# Patient Record
Sex: Female | Born: 1935 | Race: White | Hispanic: No | Smoking: Never smoker
Health system: Southern US, Community
[De-identification: ages and names within clinical notes are randomized; demographics above are authoritative.]

## PROBLEM LIST (undated history)

## (undated) DIAGNOSIS — I5042 Chronic combined systolic (congestive) and diastolic (congestive) heart failure: Secondary | ICD-10-CM

## (undated) DIAGNOSIS — I480 Paroxysmal atrial fibrillation: Secondary | ICD-10-CM

## (undated) DIAGNOSIS — I421 Obstructive hypertrophic cardiomyopathy: Secondary | ICD-10-CM

## (undated) DIAGNOSIS — I82409 Acute embolism and thrombosis of unspecified deep veins of unspecified lower extremity: Secondary | ICD-10-CM

## (undated) DIAGNOSIS — I340 Nonrheumatic mitral (valve) insufficiency: Secondary | ICD-10-CM

## (undated) DIAGNOSIS — J449 Chronic obstructive pulmonary disease, unspecified: Secondary | ICD-10-CM

## (undated) DIAGNOSIS — I10 Essential (primary) hypertension: Secondary | ICD-10-CM

## (undated) DIAGNOSIS — J690 Pneumonitis due to inhalation of food and vomit: Secondary | ICD-10-CM

## (undated) DIAGNOSIS — J189 Pneumonia, unspecified organism: Secondary | ICD-10-CM

## (undated) DIAGNOSIS — I447 Left bundle-branch block, unspecified: Secondary | ICD-10-CM

## (undated) DIAGNOSIS — C44622 Squamous cell carcinoma of skin of right upper limb, including shoulder: Secondary | ICD-10-CM

## (undated) DIAGNOSIS — R011 Cardiac murmur, unspecified: Secondary | ICD-10-CM

## (undated) DIAGNOSIS — R0602 Shortness of breath: Secondary | ICD-10-CM

## (undated) DIAGNOSIS — Z950 Presence of cardiac pacemaker: Secondary | ICD-10-CM

## (undated) DIAGNOSIS — I639 Cerebral infarction, unspecified: Secondary | ICD-10-CM

## (undated) DIAGNOSIS — I214 Non-ST elevation (NSTEMI) myocardial infarction: Secondary | ICD-10-CM

## (undated) DIAGNOSIS — I071 Rheumatic tricuspid insufficiency: Secondary | ICD-10-CM

## (undated) DIAGNOSIS — E871 Hypo-osmolality and hyponatremia: Secondary | ICD-10-CM

## (undated) DIAGNOSIS — Z9981 Dependence on supplemental oxygen: Secondary | ICD-10-CM

## (undated) DIAGNOSIS — F419 Anxiety disorder, unspecified: Secondary | ICD-10-CM

## (undated) DIAGNOSIS — R001 Bradycardia, unspecified: Secondary | ICD-10-CM

## (undated) DIAGNOSIS — R1310 Dysphagia, unspecified: Secondary | ICD-10-CM

## (undated) DIAGNOSIS — R7303 Prediabetes: Secondary | ICD-10-CM

## (undated) HISTORY — PX: CHOLECYSTECTOMY: SHX55

---

## 1998-11-04 ENCOUNTER — Ambulatory Visit (HOSPITAL_COMMUNITY): Admission: RE | Admit: 1998-11-04 | Discharge: 1998-11-04 | Payer: Self-pay | Admitting: Neurology

## 2005-02-22 ENCOUNTER — Ambulatory Visit: Payer: Self-pay | Admitting: Emergency Medicine

## 2005-02-22 ENCOUNTER — Observation Stay (HOSPITAL_COMMUNITY): Admission: EM | Admit: 2005-02-22 | Discharge: 2005-02-22 | Payer: Self-pay | Admitting: Emergency Medicine

## 2007-10-26 ENCOUNTER — Encounter (INDEPENDENT_AMBULATORY_CARE_PROVIDER_SITE_OTHER): Payer: Self-pay | Admitting: Neurology

## 2007-10-26 ENCOUNTER — Inpatient Hospital Stay (HOSPITAL_COMMUNITY): Admission: EM | Admit: 2007-10-26 | Discharge: 2007-11-03 | Payer: Self-pay | Admitting: Emergency Medicine

## 2007-10-26 ENCOUNTER — Ambulatory Visit: Payer: Self-pay | Admitting: Cardiology

## 2007-10-26 ENCOUNTER — Ambulatory Visit: Payer: Self-pay | Admitting: Critical Care Medicine

## 2007-10-27 ENCOUNTER — Encounter (INDEPENDENT_AMBULATORY_CARE_PROVIDER_SITE_OTHER): Payer: Self-pay | Admitting: Neurology

## 2007-10-29 ENCOUNTER — Ambulatory Visit: Payer: Self-pay | Admitting: Physical Medicine & Rehabilitation

## 2007-11-03 ENCOUNTER — Ambulatory Visit: Payer: Self-pay | Admitting: Cardiology

## 2007-11-03 ENCOUNTER — Inpatient Hospital Stay (HOSPITAL_COMMUNITY)
Admission: RE | Admit: 2007-11-03 | Discharge: 2007-11-10 | Payer: Self-pay | Admitting: Physical Medicine & Rehabilitation

## 2007-11-05 ENCOUNTER — Other Ambulatory Visit: Payer: Self-pay | Admitting: Neurology

## 2007-11-18 ENCOUNTER — Ambulatory Visit: Payer: Self-pay | Admitting: Cardiology

## 2007-11-18 ENCOUNTER — Inpatient Hospital Stay (HOSPITAL_COMMUNITY): Admission: AD | Admit: 2007-11-18 | Discharge: 2007-11-21 | Payer: Self-pay | Admitting: Cardiology

## 2007-11-19 HISTORY — PX: INSERT / REPLACE / REMOVE PACEMAKER: SUR710

## 2007-11-26 ENCOUNTER — Ambulatory Visit: Payer: Self-pay | Admitting: Cardiology

## 2007-12-11 ENCOUNTER — Ambulatory Visit: Payer: Self-pay

## 2007-12-25 ENCOUNTER — Ambulatory Visit: Payer: Self-pay | Admitting: Cardiovascular Disease

## 2007-12-29 ENCOUNTER — Encounter
Admission: RE | Admit: 2007-12-29 | Discharge: 2007-12-29 | Payer: Self-pay | Admitting: Physical Medicine & Rehabilitation

## 2008-02-27 ENCOUNTER — Ambulatory Visit (HOSPITAL_COMMUNITY): Admission: RE | Admit: 2008-02-27 | Discharge: 2008-02-27 | Payer: Self-pay | Admitting: Neurology

## 2008-03-01 ENCOUNTER — Ambulatory Visit: Payer: Self-pay | Admitting: Cardiology

## 2008-03-05 ENCOUNTER — Ambulatory Visit: Payer: Self-pay | Admitting: Cardiology

## 2008-04-07 ENCOUNTER — Encounter: Payer: Self-pay | Admitting: Internal Medicine

## 2008-04-23 ENCOUNTER — Ambulatory Visit: Payer: Self-pay | Admitting: Internal Medicine

## 2008-07-30 ENCOUNTER — Encounter: Admission: RE | Admit: 2008-07-30 | Discharge: 2008-07-30 | Payer: Self-pay | Admitting: Orthopedic Surgery

## 2008-11-10 ENCOUNTER — Encounter (INDEPENDENT_AMBULATORY_CARE_PROVIDER_SITE_OTHER): Payer: Self-pay | Admitting: *Deleted

## 2008-11-12 ENCOUNTER — Encounter: Payer: Self-pay | Admitting: Internal Medicine

## 2008-11-23 ENCOUNTER — Ambulatory Visit: Payer: Self-pay | Admitting: Internal Medicine

## 2008-11-23 DIAGNOSIS — I4891 Unspecified atrial fibrillation: Secondary | ICD-10-CM | POA: Insufficient documentation

## 2008-11-24 LAB — CONVERTED CEMR LAB
AST: 33 units/L (ref 0–37)
Alkaline Phosphatase: 92 units/L (ref 39–117)
TSH: 2.85 microintl units/mL (ref 0.35–5.50)
Total Bilirubin: 0.9 mg/dL (ref 0.3–1.2)

## 2009-06-28 ENCOUNTER — Ambulatory Visit: Payer: Self-pay | Admitting: Internal Medicine

## 2009-07-07 LAB — CONVERTED CEMR LAB
ALT: 36 units/L — ABNORMAL HIGH (ref 0–35)
Alkaline Phosphatase: 96 units/L (ref 39–117)
Bilirubin, Direct: 0.1 mg/dL (ref 0.0–0.3)
Total Bilirubin: 0.4 mg/dL (ref 0.3–1.2)
Total Protein: 7.7 g/dL (ref 6.0–8.3)

## 2009-08-08 ENCOUNTER — Telehealth: Payer: Self-pay | Admitting: Internal Medicine

## 2009-09-20 ENCOUNTER — Encounter: Payer: Self-pay | Admitting: Internal Medicine

## 2009-12-08 ENCOUNTER — Telehealth: Payer: Self-pay | Admitting: Internal Medicine

## 2010-02-09 ENCOUNTER — Ambulatory Visit: Payer: Self-pay | Admitting: Internal Medicine

## 2010-02-09 ENCOUNTER — Encounter: Payer: Self-pay | Admitting: Internal Medicine

## 2010-02-09 DIAGNOSIS — R740 Nonspecific elevation of levels of transaminase and lactic acid dehydrogenase [LDH]: Secondary | ICD-10-CM

## 2010-03-02 ENCOUNTER — Encounter: Payer: Self-pay | Admitting: Internal Medicine

## 2010-03-02 LAB — CONVERTED CEMR LAB
ALT: 22 units/L (ref 0–35)
Albumin: 3.3 g/dL — ABNORMAL LOW (ref 3.5–5.2)
Alkaline Phosphatase: 109 units/L (ref 39–117)
BUN: 20 mg/dL (ref 6–23)
Calcium: 9 mg/dL (ref 8.4–10.5)
GFR calc non Af Amer: 53.55 mL/min — ABNORMAL LOW (ref >60.00)
Glucose, Bld: 102 mg/dL — ABNORMAL HIGH (ref 70–99)
Potassium: 5.5 meq/L — ABNORMAL HIGH (ref 3.5–5.1)
Sodium: 139 meq/L (ref 135–145)
Total Protein: 7.4 g/dL (ref 6.0–8.3)

## 2010-03-20 ENCOUNTER — Encounter: Payer: Self-pay | Admitting: Physical Medicine & Rehabilitation

## 2010-03-28 ENCOUNTER — Telehealth (INDEPENDENT_AMBULATORY_CARE_PROVIDER_SITE_OTHER): Payer: Self-pay | Admitting: *Deleted

## 2010-03-28 ENCOUNTER — Encounter: Payer: Self-pay | Admitting: Internal Medicine

## 2010-03-28 NOTE — Progress Notes (Signed)
Summary: WHEN TO COME BACK   Phone Note Call from Patient Call back at Home Phone [phone]   Caller: Daughter Lavonia Dana [phone] Reason for Call: Talk to Nurse Summary of Call: PT CAME IN EARLY FOR HER 6-20 APPT WHEN SHOULD SHE COME BACK? Initial call taken by: Glynda Jaeger,  August 08, 2009 4:23 PM  Follow-up for Phone Call        6 months with device clinic. Gypsy Balsam RN BSN  August 12, 2009 3:44 PM    Additional Follow-up for Phone Call Additional follow up Details #1::        put in reminder for 6 months   (done wk of 6-17) Additional Follow-up by: Glynda Jaeger,  September 01, 2009 2:10 PM

## 2010-03-28 NOTE — Miscellaneous (Signed)
Summary: pt instructions  Clinical Lists Changes  Orders: Added new Test order of TLB-T4 (Thyrox), Free [phone]) - Signed Added new Test order of TLB-TSH (Thyroid Stimulating Hormone) (84443-TSH) - Signed Added new Test order of TLB-Hepatic/Liver Function Pnl (80076-HEPATIC) - Signed Observations: Added new observation of PI CARDIO: Your physician recommends that you schedule a follow-up appointment in: 6 months with Dr Graciela Husbands Your physician recommends that you return for lab work today (06/28/2009 14:10)      Patient Instructions: 1)  Your physician recommends that you schedule a follow-up appointment in: 6 months with Dr Graciela Husbands 2)  Your physician recommends that you return for lab work today

## 2010-03-28 NOTE — Progress Notes (Signed)
Summary: pt has xray today has pacemaker not feeling well   Phone Note Call from Patient   Caller: Daughter Dawn Barrett [phone] or [phone] Reason for Call: Talk to Nurse Summary of Call: pt had dental visit, had xray , forgot about pacemaker, now not feeling well, pt's dtr's receptionist is calling so doesn't know anymore info-pls call dtr Dawn Barrett Initial call taken by: Glynda Jaeger,  December 08, 2009 12:15 PM  Additional Follow-up for Phone Call Additional follow up Details #1::        spoke w/pt's daughter to let her know that dental xr is fine w/pacer. daughter to let pt know. Vella Kohler  December 08, 2009 12:56 PM

## 2010-03-28 NOTE — Assessment & Plan Note (Signed)
Summary: appt @ 12:45/ rov/jml      Allergies Added:   Primary Provider:  Dr.Love  CC:  device check.  History of Present Illness: [name] is seen in foloowup of tachy-brady syndrome with PAF and bradycardia with previously implanted pacemaker. She denies problems with chest pain or shortness of breath. She notes that there is no significant edema  Current Medications (verified): 1)  Amiodarone Hcl 200 Mg Tabs (Amiodarone Hcl) .... Take One Half Tablet Once Daily 2)  Diovan 40 Mg Tabs (Valsartan) .... Take 3 Tablets Am, 2 Tablets Pm 3)  Coumadin 2 Mg Tabs (Warfarin Sodium) .... 2 Mgs On Mon., Wed.,  and Fri.  1 Mg On Others Days  Allergies (verified): 1)  ! Sulfa 2)  ! Codeine  Past History:  Past Medical History: Last updated: 11/23/2008 Symptomatic bradycardia with junctional rhythm History of paroxysmal atrial fibrillation treated with amiodarone  History of recent left middle cerebral artery stroke treated with intravenous tPA with significant recovery.  Hypertension Status post pacemaker for sinus node dysfunction-St Jude Zephyr 5020  Past Surgical History: Last updated: 11/23/2008 Implantation of pacemaker-St. Jude Zephyr 5020  Social History: Last updated: 11/23/2008 Tobacco Use - No.  Alcohol Use - no  Vital Signs:  Patient profile:   77 year old female Height:      60 inches Weight:      134 pounds BMI:     26.26 Pulse rate:   64 / minute Pulse rhythm:   regular BP sitting:   146 / 69  (left arm) Cuff size:   regular  Vitals Entered By: Judithe Modest CMA (Jun 28, 2009 1:01 PM)  Physical Exam  General:  The patient was alert and oriented in no acute distress. HEENT Normal.  Neck veins were flat, carotids were brisk.  Lungs were clear.  Heart sounds were regular without murmurs or gallops.  Abdomen was soft with active bowel sounds. There is no clubbing cyanosis or edema. Skin Warm and dry \\par   PPM Specifications Following MD:  Sherryl Manges, MD     PPM Vendor:  St Jude     PPM Model Number:  [id number]     PPM Serial Number:  [id number] DOI:  11/19/2006     PPM Implanting MD:  Sherryl Manges, MD  Lead 1    Location: RA     DOI: 11/19/2006     Model #: [id number]     Serial #: [id number]     Status: active Lead 2    Location: RV     DOI: 11/19/2006     Model #: [id number]     Serial #: [id number]     Status: active  Magnet Response Rate:  BOL 98.6 ERI  86.3  Indications:  Sinus node dysfunction   PPM Follow Up Remote Check?  No Battery Voltage:  2.80 V     Battery Est. Longevity:  4.50-6.69YRS     Pacer Dependent:  No       PPM Device Measurements Atrium  Amplitude: 2.2 mV, Impedance: 495 ohms, Threshold: 0.75 V at 0.5 msec Right Ventricle  Amplitude: >12 mV, Impedance: 1004 ohms, Threshold: 0.75 V at 0.5 msec  Episodes MS Episodes:  1     Percent Mode Switch:  <1%     Ventricular High Rate:  0     Atrial Pacing:  88%     Ventricular Pacing:  <1%  Parameters Mode:  DDDR     Lower Rate Limit:  60     Upper Rate Limit:  120 Paced AV Delay:  225     Sensed AV Delay:  200 Next Cardiology Appt Due:  12/27/2009 Tech Comments:  AMS LASTING 6 SECONDS.  NORMAL DEVICE FUNCTION.  NO CHANGES MADE.  ROV IN 6 MTHS. Vella Kohler  Impression & Recommendations:  Problem # 1:  ATRIAL FIBRILLATION (ICD-427.31) holding sinus rhythm on amiodarone. We will check her TSH and LFTs today Her updated medication list for this problem includes:    Amiodarone Hcl 200 Mg Tabs (Amiodarone hcl) .Marland Kitchen... Take one half tablet once daily    Coumadin 2 Mg Tabs (Warfarin sodium) .Marland Kitchen... 2 mgs on mon., wed.,  and fri.  1 mg on others days  Problem # 2:  BRADYCARDIA (ICD-427.81) he is pacing 90% of the time Her updated medication list for this problem includes:    Amiodarone Hcl 200 Mg Tabs (Amiodarone hcl) .Marland Kitchen... Take one half tablet once daily    Coumadin 2 Mg Tabs (Warfarin sodium) .Marland Kitchen... 2 mgs on mon., wed.,  and fri.  1 mg on others days  Problem # 3:   PACEMAKER ST JUDES DDD (ICD-V45.01) Device parameters and data were reviewed and no changes were made

## 2010-03-28 NOTE — Cardiovascular Report (Signed)
Summary: Office Visit   Office Visit   Imported By: Roderic Ovens 07/01/2009 14:15:42  _____________________________________________________________________  External Attachment:    Type:   Image     Comment:   External Document

## 2010-03-30 NOTE — Assessment & Plan Note (Signed)
Summary: 6 month follow up/mt      Allergies Added:   Visit Type:  6 month follow up Primary Provider:  Orma Render  CC:  no complaints.  History of Present Illness: Dawn Barrett is seen in foloowup of tachy-brady syndrome with PAF and bradycardia with previously implanted pacemaker.  She takes amiodarone. The last July surveillance laboratories demonstrated a mild elevation in her transaminases. These were rechecked subsequently and they had normalized. She denies problems with chest pain or shortness of breath. She notes that there is no significant edema  Problems Prior to Update: 1)  Transaminases, Serum, Elevated  (ICD-790.4) 2)  Encounter For Long-term Use of Other Medications  (ICD-V58.69) 3)  Encounter For Long-term Use of Amiodarone  (ICD-V58.69) 4)  Pacemaker St Judes Ddd  (ICD-V45.01) 5)  Bradycardia  (ICD-427.81) 6)  Atrial Fibrillation  (ICD-427.31)  Current Medications (verified): 1)  Amiodarone Hcl 200 Mg Tabs (Amiodarone Hcl) .... Take One Half Tablet Once Daily 2)  Diovan 40 Mg Tabs (Valsartan) .... Take 3 Tablets Am, 2 Tablets Pm 3)  Coumadin 2 Mg Tabs (Warfarin Sodium) .... 2 Mgs On Mon., Wed.,  and Fri.  1 Mg On Others Days  Allergies (verified): 1)  ! Sulfa 2)  ! Codeine  Past History:  Past Medical History: Last updated: 11/23/2008 Symptomatic bradycardia with junctional rhythm History of paroxysmal atrial fibrillation treated with amiodarone  History of recent left middle cerebral artery stroke treated with intravenous tPA with significant recovery.  Hypertension Status post pacemaker for sinus node dysfunction-St Jude Zephyr 5020  Past Surgical History: Last updated: 11/23/2008 Implantation of pacemaker-St. Jude Zephyr 5020  Social History: Last updated: 11/23/2008 Tobacco Use - No.  Alcohol Use - no  Risk Factors: Smoking Status: never (11/23/2008)  Vital Signs:  Patient profile:   77 year old female Height:      60 inches Weight:       130 pounds BMI:     25.48 Pulse rate:   77 / minute BP sitting:   150 / 72  (left arm) Cuff size:   regular  Vitals Entered By: Caralee Ates CMA (February 09, 2010 10:53 AM)  Physical Exam  General:  The patient was alert and oriented in no acute distress. HEENT Normal.  Neck veins were flat, carotids were brisk.  Lungs were clear. she is markedly kyphotic Heart sounds were regular without murmurs or gallops.  Abdomen was soft with active bowel sounds. There is no clubbing cyanosis or edema. Skin Warm and dry    EKG  Procedure date:  02/09/2010  Findings:      A pacing with LBBB, LAD  PPM Specifications Following MD:  Sherryl Manges, MD     PPM Vendor:  St Jude     PPM Model Number:  [id number]     PPM Serial Number:  [id number] PPM DOI:  11/19/2006     PPM Implanting MD:  Sherryl Manges, MD  Lead 1    Location: RA     DOI: 11/19/2006     Model #: [id number]     Serial #: [id number]     Status: active Lead 2    Location: RV     DOI: 11/19/2006     Model #: [id number]     Serial #: [id number]     Status: active  Magnet Response Rate:  BOL 98.6 ERI  86.3  Indications:  Sinus node dysfunction   PPM Follow Up Battery Voltage:  2.79 V  Battery Est. Longevity:  4-5.25 yrs     Pacer Dependent:  No       PPM Device Measurements Atrium  Amplitude: 2.5 mV, Impedance: 434 ohms, Threshold: 0.75 V at 0.5 msec Right Ventricle  Amplitude: 12.0 mV, Impedance: 1162 ohms, Threshold: 0.75 V at 0.5 msec  Episodes MS Episodes:  0     Ventricular High Rate:  0     Atrial Pacing:  88%     Ventricular Pacing:  <1%  Parameters Mode:  DDDR     Lower Rate Limit:  60     Upper Rate Limit:  120 Paced AV Delay:  225     Sensed AV Delay:  200 Next Cardiology Appt Due:  07/28/2010 Tech Comments:  NORMAL DEVICE FUNCTION.  NO EPISODES OR MODE SWITCHES RECORDED.  NO CHANGES MADE. ROV IN 6 MTHS W/DEVICE CLINIC. Vella Kohler  February 09, 2010 11:11 AM  Impression & Recommendations:  Problem # 1:  PACEMAKER ST  JUDES DDD (ICD-V45.01) Device parameters and data were reviewed and no changes were made  Problem # 2:  ENCOUNTER FOR LONG-TERM USE OF OTHER MEDICATIONS (ICD-V58.69)  needs repeat labs for Amio survillance  Orders: TLB-Hepatic/Liver Function Pnl (80076-HEPATIC) TLB-TSH (Thyroid Stimulating Hormone) (84443-TSH) TLB-BMP (Basic Metabolic Panel-BMET) (80048-METABOL)  Problem # 3:  ATRIAL FIBRILLATION (ICD-427.31)  no intercurrent af  is taking amio Her updated medication list for this problem includes:    Amiodarone Hcl 200 Mg Tabs (Amiodarone hcl) .Dawn Barrett... Take one half tablet once daily    Coumadin 2 Mg Tabs (Warfarin sodium) .Dawn Barrett... 2 mgs on mon., wed.,  and fri.  1 mg on others days  Orders: TLB-Hepatic/Liver Function Pnl (80076-HEPATIC) TLB-TSH (Thyroid Stimulating Hormone) (84443-TSH) TLB-BMP (Basic Metabolic Panel-BMET) (80048-METABOL)  Problem # 4:  BRADYCARDIA (ICD-427.81) 90 % paced  Her updated medication list for this problem includes:    Amiodarone Hcl 200 Mg Tabs (Amiodarone hcl) .Dawn Barrett... Take one half tablet once daily    Coumadin 2 Mg Tabs (Warfarin sodium) .Dawn Barrett... 2 mgs on mon., wed.,  and fri.  1 mg on others days  Problem # 5:  TRANSAMINASES, SERUM, ELEVATED (ICD-790.4) will recheck today  Patient Instructions: 1)  Your physician recommends that you have lab work today. 2)  Your physician recommends that you continue on your current medications as directed. Please refer to the Current Medication list given to you today. 3)  Your physician wants you to follow-up in:6 months.    You will receive a reminder letter in the mail two months in advance. If you don't receive a letter, please call our office to schedule the follow-up appointment.

## 2010-03-30 NOTE — Cardiovascular Report (Signed)
Summary: Office Visit   Office Visit   Imported By: Roderic Ovens 02/13/2010 16:20:10  _____________________________________________________________________  External Attachment:    Type:   Image     Comment:   External Document

## 2010-03-30 NOTE — Letter (Signed)
Summary: Recall, Labs Needed  [facility], Main Office  [address]   [place], [place] [address]   Phone: [phone]  Fax: [phone]     March 02, 2010 MRN: [id number]   [facility] [address] [place], [place]  [address]   Dear Ms. Gouge,  Our records indicate it is time to repeat your blood work.  Please call our office to schedule your appointment.    After your labs are drawn, our office will call you within a week to ten business days with the results.  If you do not hear from [place] in 10 business days, you should call the office.  If you have any questions regarding this, call the office at the number listed above, and ask for the nurse.  Labs are due: As soon as possible.                               Sincerely,   [facility]

## 2010-04-05 NOTE — Progress Notes (Signed)
   Phone Note Outgoing Call   Call placed by: Scherrie Bateman, LPN,  March 28, 2010 9:03 AM Call placed to: PT'S DAUGHTER ASSISTANT MONICA Summary of Call: DAUGHTER'S ASSISTANT AWARE PT NEEDS  REPEAT BMET ORDER FAXED  TO DAUGHTER'S OFFICE PT TO HAVE DONE IN [place]. Valinda Hoar NUMBER [phone] Initial call taken by: Scherrie Bateman, LPN,  March 28, 2010 9:04 AM

## 2010-04-19 ENCOUNTER — Telehealth: Payer: Self-pay | Admitting: Internal Medicine

## 2010-04-25 NOTE — Progress Notes (Signed)
Summary: QUESTION RE MEDS    Phone Note Call from Patient Call back at [facility] [phone]   Caller: Daughter/BONNIE [phone] Reason for Call: Talk to Nurse, Talk to Doctor Summary of Call: QUESTION RE MEDS FOR TEST.  Initial call taken by: Roe Coombs,  April 19, 2010 3:45 PM  Follow-up for Phone Call        Phone Call Completed PER DAUGHTER PT  HARD TO AROUSE  WENT TO BED LAST NIGHT AT 5:OO PM AND DIDN'T GET UP TIL THIS  AM  THYROID IS HYPOTHYROIDISM PER LAB   DAUGHTER STATES  S/S NOTED FOR 4-5 DAYS  WORSE OVER LAST COUPLE OF DAYS WILL DISCUSS WITH DR Graciela Husbands . Follow-up by: Scherrie Bateman, LPN,  April 19, 2010 5:38 PM  Additional Follow-up for Phone Call Additional follow up Details #1::        spooke w fmaily and reviewed tsh  i dont think that that is the issue Additional Follow-up by: Nathen May, MD, [facility],  April 19, 2010 6:15 PM

## 2010-05-16 NOTE — Medication Information (Signed)
Summary: Physician's Lab Orders   Physician's Lab Orders   Imported By: Roderic Ovens 05/04/2010 11:32:05  _____________________________________________________________________  External Attachment:    Type:   Image     Comment:   External Document

## 2010-06-12 LAB — CBC
HCT: 36.8 % (ref 36.0–46.0)
Platelets: 170 10*3/uL (ref 150–400)
RDW: 15 % (ref 11.5–15.5)
WBC: 5.4 10*3/uL (ref 4.0–10.5)

## 2010-06-12 LAB — DIFFERENTIAL
Basophils Absolute: 0 10*3/uL (ref 0.0–0.1)
Eosinophils Relative: 2 % (ref 0–5)
Lymphocytes Relative: 37 % (ref 12–46)
Lymphs Abs: 2 10*3/uL (ref 0.7–4.0)
Neutro Abs: 2.6 10*3/uL (ref 1.7–7.7)

## 2010-06-12 LAB — PROTIME-INR
INR: 1.5 (ref 0.00–1.49)
Prothrombin Time: 18.8 seconds — ABNORMAL HIGH (ref 11.6–15.2)

## 2010-07-11 NOTE — Discharge Summary (Signed)
Dawn Barrett, Dawn Barrett           ACCOUNT NO.:  [id number]   MEDICAL RECORD NO.:  [id number]          PATIENT TYPE:  INP   LOCATION:  2019                         FACILITY:  [facility]   PHYSICIAN:  Duke Salvia, MD, FACCDATE OF BIRTH:  17-Nov-1918   DATE OF ADMISSION:  11/18/2007  DATE OF DISCHARGE:  11/21/2007                               DISCHARGE SUMMARY   ADDENDUM   This is a revision of her medication list.  1. She is asked to keep her incision dry for the next 7 days .  Sponge      bathe until Wednesday, November 26, 2007, that she had a pacemaker      implanted on November 19, 2007.   MEDICATIONS:  1. Coumadin 1 mg daily, Home Health provides liaison to Dr. Juanda Chance.  2. Nexium 40 mg daily.  3. Multivitamin daily.  4. Lasix 20 mg daily, hold for now.  5. Amiodarone 100 mg daily, hold for now (Dr. Juanda Chance to decide to      reinstate at office visit).  6. Diovan, hold but reinstate IF blood pressure arises above 140 mmHg,      the systolic number.   She has [facility] [address]  1. Home Health manages Coumadin.  2. Dr. Juanda Chance, Thursday, December 11, 2007, at 8:45.  3. [facility], Thursday, December 11, 2007, at 9:40.  4. She will see Dr. Juanda Chance, January 2010 and his office will call with      an appointment.      Maple Mirza, [place]      Duke Salvia, MD, [facility]  Electronically Signed    GM/MEDQ  D:  11/21/2007  T:  11/21/2007  Job:  [id number]
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
## 2010-07-11 NOTE — Procedures (Signed)
This is an 77 year old female patient with EEG number [id number], currently  in room 3110 at [facility] admitted under the care of Dr. Avie Echevaria and Dr. Delia Heady.   Ordering physician for this EEG was Dr. Avie Echevaria.  Medical record  number [id number].   This is a portable EEG recording performed at the bedside in an ICU  setting for a lethargic right-handed patient who presented with sudden  onset of expressive aphagia and weakness. Atrial fibrillation is the  presumed cause for an ischaemic, embolic cerebral infarct in the left  ant. temporal lobe. The patient received TPA.   Current medications include Lasix, fentanyl, Versed, Protonix, Zosyn,  Normodyne, Cardene, Reglan, Tylenol, and Levophed.   DESCRIPTION:  This EEG is generalized slow.  A background rhythm cannot  be established.  The dominant amplitudes appear to arise from the right  frontal central region.  There is slowing and amplitude suppression  noticed over the frontal, central, and parietal region on the left.  Neither photic stimulation, no hyperventilation were performed.  The EKG  electrode channel shows a significant arrhythmia, highly irregular  shaped QRS complexes, and R to R intervals.  Throughout the recording,  there is a better formed and better organized EEG.  Background and rhythm seen throughout the right hemisphere in comparison  to the left, the patient did never reach alpha frequencies, but theta  and delta frequencies are seen.   CONCLUSION:  This is an abnormal EEG.  1. Left-sided amplitude suppression and slowing, significant      asymmetry.  2. Generalized slowing.  Question if due to medications.  It is not      noted on this sheet if the patient's sedation was stopped before      the EEG was written.  3. There is only over the left frontoparietal region some muscle      artifact noted raising the suspicion that the patient may have a      right-sided facial paralysis.  Photic  stimulation was performed at      the bedside towards the end of this recording and showed eye      twitching, more pronounced on the right than on the left; and      entrainment more pronounced on the right than on the left.  The      patient was also noted to move her left hand intermittently during      this recording.  Epileptiform discharges were not noted.  Hyperventilation had to be deferred.  1. Conclusion abnormal EEG, see above.      Melvyn Novas, M.D.  Electronically Signed    [id number]  D:  10/27/2007 12:42:13  T:  10/28/2007 05:30:14  Job #:  [id number]

## 2010-07-11 NOTE — H&P (Signed)
NAMEMAEDELL, HEDGER           ACCOUNT NO.:  [id number]   MEDICAL RECORD NO.:  [id number]          PATIENT TYPE:  INP   LOCATION:  3110                         FACILITY:  [facility]   PHYSICIAN:  Casimiro Needle L. Reynolds, M.D.DATE OF BIRTH:  Nov 29, 1918   DATE OF ADMISSION:  10/26/2007  DATE OF DISCHARGE:                              HISTORY & PHYSICAL   CHIEF COMPLAINT:  Aphasia.   HISTORY OF PRESENT ILLNESS:  This is one of several [facility] admissions and the first Stroke Service admission for this 77-  year-old woman with a past medical history which includes  cerebrovascular disease, for which she is chronically followed in our  office by Dr. Sandria Manly.  The patient was at home today with her family when  at 1:30 p.m., she had witnessed onset of aphasia, right facial droop,  and right-sided weakness.  EMS was alerted almost immediately, and upon  their arrival, the same findings were noted.  Code stroke was called in  the field, and the patient was brought to the [facility], where  she was noted to have improvement in her hemiparesis with still  significant aphasia and inability to follow commands.  She had a CT of  the head and routine code stroke protocol was initiated.  At the time of  this evaluation, she is awake and alert and remains aphasic.  Family is  at bedside and confirms the history and the time of onset.  They state  that a few days ago, she had an episode at which she was generally weak  and did not feel well, and actually saw Dr. Sandria Manly at that time, but she  had no focal findings and it was not Dr. Imagene Gurney feeling that at that  time she had a stroke.  TIA was felt to be possible.   PAST MEDICAL HISTORY:  Remarkable for previous cerebrovascular disease,  history of TIA about 17 years ago, and few events since then.  She has  had a history of hypertension, but beyond that no other chronic medical  problems.  She has had cholecystectomy and thyroid  surgery.   MEDICATIONS:  Diovan 40 mg daily and aspirin.   ALLERGIES:  She claims allergies to many medications, but the only  certain allergy is SULFA.   FAMILY HISTORY:  Mother suffered a stroke.   SOCIAL HISTORY:  She is independent in activities of daily living.  She  lives with her daughter.  She swims on a regular basis.  She has not  used tobacco or illicit drugs.   REVIEW OF SYSTEMS:  NEUROLOGIC:  As above.  CV:  Negative.  PULMONARY:  Negative.  GI:  She had a bout of nausea about 3 weeks ago.  Otherwise,  full 10-system review of systems is negative, except as outlined in the  HPI and in the accompanying admission nursing record.   PHYSICAL EXAMINATION:  VITAL SIGNS:  Temperature 97.7, blood pressure  152/58, pulse 70, and respirations 16.  GENERAL:  This is an elderly, but well-appearing woman, supine in the  hospital bed, in no evident distress.  HEAD:  Cranium is normocephalic and atraumatic.  Oropharynx is benign.  NECK:  Supple without carotid or supraclavicular bruit.  CHEST:  Clear to auscultation bilaterally.  HEART:  Regular rate and rhythm without murmur.  ABDOMEN:  Soft and normoactive bowel sounds.  EXTREMITIES:  No edema.  Pulses are 2+.  NEUROLOGIC:  Mental status; she is awake and alert.  Her speech is  nonsensical.  She is only able to follow a very few simple midline  commands.  Cranial nerves; pupils are equal and reactive.  She is able  to look to the left and the right spontaneously.  She does not blink to  threat from the right.  Face, tongue, and palate moved normally and  symmetrically.  Motor; normal bulk and tone.  She moves all extremities  well without drift.  Sensation intact to painful stimulus in all  extremities.  Coordination; finger-to-nose is limited by understanding,  but can be performed without significant ataxia.  Gait is deferred.  Reflexes are 2+ and symmetric.  Toes are downgoing bilaterally.  NIH  stroke scale score is 10.    LABORATORY REVIEW:  CBC; white count 6.1, hemoglobin 12.2, and platelets  161,000.  Electrolytes are pending.  Coags are normal.  CT of the head  is personally reviewed and the study demonstrates atrophy and white  matter disease without an acute finding.   IMPRESSION:  Acute left brain stroke with aphasia and right hemiparesis.  Her right hemiparesis is improving, but her aphasia is still  significant.   PLAN:  Risks and benefits of intravenous t-PA were discussed with the  patient and with her family at some length.  In the end, they were  agreeable to proceeding and she received an infusion.  She will then be  admitted to the ICU for routine post t-PA care.  She will need a stroke  workup, including MRI, MRA, carotid and transcranial Dopplers, 2-D echo,  and stroke labs.  Physical, Occupational, and Speech Therapy will be  consulted to assess her rehabilitation.  Stroke Service to follow.  Dr.  Sandria Manly is aware of her admission.      Michael L. Thad Ranger, M.D.  Electronically Signed     MLR/MEDQ  D:  10/26/2007  T:  10/27/2007  Job:  [id number]

## 2010-07-11 NOTE — Assessment & Plan Note (Signed)
[facility]                                 ON-CALL NOTE   NAME:Fyfe, Oberia                    MRN:          [id number]  DATE:02/29/2008                            DOB:          Jul 20, 1918    The patient paged on call pager.  When phone call was returned, there  was a question as to whether amiodarone and azithromycin could to be  taken together safely.  I checked the pharmacy and there is no  significant interaction between those 2 medications.  The patient was  called back and instructed to continue the same dose of amiodarone with  her azithromycin, short course.  There were no questions or concerns at  that time from the patient.      Jarrett Ables, [facility]       Duke Salvia, MD, [facility]  Electronically Signed   MS/MedQ  DD: 02/29/2008  DT: 02/29/2008  Job #: [id number]

## 2010-07-11 NOTE — H&P (Signed)
[name], Barrett           ACCOUNT NO.:  [id number]   MEDICAL RECORD NO.:  [id number]          PATIENT TYPE:  INP   LOCATION:  NA                           FACILITY:  [facility]   PHYSICIAN:  Thomas C. Wall, MD, FACCDATE OF BIRTH:  1919-01-24   DATE OF ADMISSION:  DATE OF DISCHARGE:                              HISTORY & PHYSICAL   CHIEF COMPLAINT:  Slow heart rate and listlessness.   [name] is an 77 year old white female whom I saw in the  office today per the request of Dr. Melbourne Abts and Mrs. Creelman's family.   She has a history of a left bundle branch block and a history of brady  arrhythmia.  She was recently discharged from the hospital on November 10, 2007 after suffering a left middle cerebral artery infarct with  aphasia, right facial droop, and right-sided weakness.  She was treated  with intravenous tPA.   During her admission, she had a 2-D echocardiogram which showed an EF of  65-70%.  There was no obvious cardiogenic source of embolus.  Carotid  Doppler did not show any internal carotid artery stenosis.   She ended up having some atrial fibrillation during her hospitalization  and was placed on amiodarone.   Since discharge, she has apparently been running a slow heart rate of  40s.  The family has spoken to Dr. Melbourne Abts and also Dr. Juanda Chance.  Dr.  Sandria Manly asked me to see her today.  Dr. Juanda Chance is out of town.   Her daughter, Dawn Barrett, is here today.  She says, over the last day or so  she has been more listless and hears everything, responds to things  appropriately, just does not act her normal self.  She has not had any  syncope.  She denies any chest pain, shortness of breath, orthopnea,  PND.   An EKG in the office today shows a junctional rhythm at a rate of about  45 beats per minute.  She has a baseline interventricular conduction  delay that resembles mostly a left bundle type pattern.  I do not see  any P-waves except for maybe 1  complex.   PAST MEDICAL HISTORY:  She has a history of hypertension.  She has a  history of the left bundle branch block as mentioned above.  The last  EKG I see in the chart was in 2004.  She has had a partial  thyroidectomy, laparoscopic cholecystectomy.  She has also had a history  of right lower extremity sciatica.   She is allergic to SULFA and CODEINE.   FAMILY HISTORY:  Positive for premature coronary artery disease.   SOCIAL HISTORY:  The patient lives with her daughter and son-in-law in a  two-level house.  She does not use any tobacco or alcohol.  She was very  active prior to her stroke and was a regular exerciser.   REVIEW OF SYSTEMS:  Negative except for the HPI.   MEDICATIONS:  1. Coumadin as directed.  2. Lasix 20 mg a day.  3. Diovan 120 mg q. a.m.  4. Amiodarone 100 mg a day.  The amiodarone had been reduced from 200 to 100 with this bradycardia.   EXAM:  CONSTITUTIONAL:  She is very pleasant lady and no acute distress.  She is very listless but listens to commands and follows commands  appropriately.  VITAL SIGNS:  Her blood pressure is 131/67.  Her heart rate is 44 and  regular.  HEENT:  Normocephalic, atraumatic.  PERRL.  Extraocular movements  intact.  Sclerae are clear.  I see no significant right facial droop.  NECK:  Supple.  Carotid upstrokes are equal bilateral without bruits.  Thyroid is not enlarged.  Trachea is midline.  Lungs are clear to  auscultation.  HEART:  Reveals a slow rate and rhythm that is regular.  There is no  gallop.  ABDOMEN:  Soft.  EXTREMITIES:  No cyanosis, clubbing or edema.  Pulses are brisk.  NEURO:  Grossly intact today.   ASSESSMENT:  1. Symptomatic bradycardia with junctional rhythm.  2. History of paroxysmal atrial fibrillation treated with amiodarone.  3. History of recent left middle cerebral artery stroke treated with      intravenous tPA with significant recovery.  4. History of very independent living.  5.  Hypertension.  6. Anticoagulation.   I have spoken to Debby Bud, her daughter.  I think admission to  the CCU step-down is appropriate.  Will call EMS for non-emergent  transfer.  I have talked to Dr. Berton Mount, our electrophysiologist in  the hospital, and he will see her once she arrives.  We will obviously  stop the amiodarone and Coumadin at present.      Thomas C. Daleen Squibb, MD, [facility]  Electronically Signed     TCW/MEDQ  D:  11/18/2007  T:  11/18/2007  Job:  [id number]   cc:   Evie Lacks, MD

## 2010-07-11 NOTE — Assessment & Plan Note (Signed)
 HEALTHCARE                         ELECTROPHYSIOLOGY OFFICE NOTE   NAME:Flippen, Ben B                  MRN:          [id number]  DATE:04/23/2008                            DOB:          13-Oct-1918    Ms. Laspina comes in for followup for pacemaker implanted for  significant sinus node dysfunction.  She has a history of paroxysmal  atrial fibrillation, is on chronic Coumadin.  She presented in September  with significant bradycardia, underwent pacemaker implantation and has  had continued improvement.  She is now conversant again following her  stroke, which had made her aphasic.   Her medications include:  1. Coumadin.  2. Amiodarone.  3. Diovan.   Amiodarone surveillance labs obtained in October were normal.   On examination today, her blood pressure is 140/76, her pulse was 64.  Her weight was 133, which is up a little bit.  Her lungs were clear.  Hearts sounds were regular.  Pacemaker pocket was well healed, and  extremities had no edema.   IMPRESSION:  1. Sinus node dysfunction.  2. Paroxysmal atrial fibrillation.  3. Status post pacemaker for sinus node dysfunction.  4. Prior stroke.   Ms. Dusek is doing quite well.  Interrogation of her pacemaker  demonstrated no intercurrent atrial high rate episodes.   She will need amiodarone surveillance laboratories in about 3 months.   We will see her again at that time.     Duke Salvia, MD, [facility]  Electronically Signed    SCK/MedQ  DD: 04/23/2008  DT: 04/24/2008  Job #: [id number]

## 2010-07-11 NOTE — H&P (Signed)
NAMEMARIS, Dawn Barrett           ACCOUNT NO.:  [id number]   MEDICAL RECORD NO.:  [id number]          PATIENT TYPE:  INP   LOCATION:  3110                         FACILITY:  [facility]   PHYSICIAN:  Ellwood Dense, M.D.   DATE OF BIRTH:  [birth year]   DATE OF ADMISSION:  10/26/2007  DATE OF DISCHARGE:                              HISTORY & PHYSICAL   PRIMARY CARE PHYSICIAN:  None.   NEUROLOGIST:  Dr. Sandria Manly and Dr. Pearlean Brownie.   HISTORY OF PRESENT ILLNESS:  Dawn Barrett is an 77 year old Caucasian  female with history of hypertension who was fairly active, swimming on a  regular basis.  She is admitted October 26, 2007, with sudden onset of  right facial droop along with aphasia and right-sided weakness.  On  arrival to the emergency department, the patient had improvement in  weakness but continued with aphagia, more expressive than receptive.  Initial cranial CT was without changes.  The patient was treated with  TPA.  She did have some shortness of breath and hypoxia requiring  intubation, but that resolved.  Repeat cranial CT showed left middle  cerebral artery infarct without hemorrhage.  MRI and MRA study of the  brain showed left middle cerebral artery infarct involving the anterior  insular frontal cortex.  Carotid Doppler showed no significant internal  carotid artery stenosis.  A 2-D echocardiogram showed ejection fraction  of 65-70%.  The patient was found to have fluid overload and was treated  with IV diuretics and hypotension requiring dopamine drip.  She was  noted to have atrial fibrillation but no Coumadin was initiated.  She  did develop rapid ventricular response related to the atrial  fibrillation requiring amiodarone and Cardizem.  She was extubated  October 28, 2007.  Coumadin was added October 30, 2007 for embolic  left middle cerebral artery infarct.   Swallow evaluation was done and the patient showed delay in swallowing  with aspiration of thin liquids.  She was  placed on D-3 nectar-thick  liquids at that time.  Therapies have been ongoing with the patient  showing decreased attention to the right with delayed processing and  decreased verbal output.  She is right homonymous hemianopsia.  Chest x-  ray November 01, 2007, showed improved bilateral lower lobe volumes with  decreased pulmonary edema with moderate bilateral pleural effusions  noted.   The patient was evaluated by the rehabilitation physicians and felt to  be an appropriate candidate for inpatient rehabilitation.   REVIEW OF SYSTEMS:  Positive for incontinence.   PAST MEDICAL HISTORY:  1. Hypertension.  2. Left bundle branch block.  3. Partial thyroidectomy.  4. Laparoscopic cholecystectomy.  5. TIAs.  6. Right lower extremity sciatica.  7. History of brady arrhythmia.  8. Asymmetrical septal hypertrophy.   FAMILY HISTORY:  Positive for coronary artery disease.   SOCIAL HISTORY:  The patient lives with her daughter, son-in-law and  grandson in a large two-level home with one step to enter.  The patient  does not use alcohol or tobacco, and is fairly active, swimming on a  regular basis.  Family has a Advertising copywriter and planned  to have private  aids both in the hospital and at home health care for her.   FUNCTIONAL HISTORY:  Prior admission, the patient was independent  walking 30-45 minutes daily and also independent with driving short  distances and swimming on a regular basis.   ALLERGIES:  SULFA AND CODEINE.   MEDICATIONS PRIOR TO ADMISSION:  1. Diovan 40 mg daily.  2. Aspirin 325 mg daily.   LABORATORY:  Recent hemoglobin was 11.2 with hematocrit 32.8, platelet  count of 127,000.  White count 9.7.  Recent sodium was 136, potassium  4.2, chloride 102, bicarbonate 28, BUN 24, creatinine 0.8, glucose of  91.  Recent total cholesterol was 184 with HDL cholesterol 77, LDL  cholesterol of 92 and triglycerides of 74.  Homocysteine level was 8.6  with hemoglobin A1c of 5.7.   Recent BNP of 610 measured October 27, 2007.   PHYSICAL EXAMINATION:  GENERAL:  Reasonably well-appearing elderly thin  adult female lying in bed in no acute discomfort.  VITAL SIGNS:  Blood pressure and vitals were not yet obtained.  HEENT:  Normocephalic, nontraumatic.  CARDIOVASCULAR:  Irregular rate and rhythm.  S1-S2 without murmurs.  ABDOMEN:  Soft, nontender with positive bowel sounds.  LUNGS:  Clear to auscultation bilaterally.  NEUROLOGIC:  Alert and oriented x3 with cues.  She is able to answer  questions appropriate, but has decreased out put but good comprehension.  Tongue was in the midline with normal facial symmetry.  There was slight  decreased attention to the right with questionable right homonymous  hemianopsia.  Right upper and right lower extremity strength were  generally 3+ to 4-/5.  Bulk was decreased and tone was normal.  Left  upper and left lower extremity strength were generally 4/5.   DIAGNOSIS:  1. Status post left middle cerebral artery infarct affecting the left      posterior frontal lobe and left anterior parietal lobe along with      anterior superior left temporal lobes.  Expressive greater than      receptive aphasia.  2. Hypertension.  3. Atrial fibrillation in normal sinus rhythm on amiodarone and      Coumadin.  4. Congestive heart failure compensated on medication.  5. Acute renal insufficiency resolving on diuretics.  6. Incontinence, toileting q.4 h.   Presently, the patient will be admitted and receive collaborative and  interdisciplinary care between the physiatrist, rehab nursing staff and  therapy team.  The patient's level of medical complexity and substantial  therapy needs in context of that medical necessity cannot be provided at  lesser intensity of care.  1. Physiatrist will provide 24-hour management of medical needs as      well as oversight of therapy plan/treatment and provide guidance as      appropriate regarding the  interaction of the tube.  2. A 24-hour rehab nursing will assistant in management of bowel and      bladder and help integrate therapy concepts, techniques and      education.  3. Physical therapy will assess and treat for range of motion,      strengthening, bed mobility, transfers, pre gait training, gait      training and equipment evaluation.  4. Occupational therapy will assess and treat for range of motion,      strengthening, ADLs, cognitive/perceptional training, splinting and      equipment evaluation.  5. Speech therapy will assess and treat for dysphagia, oral motor      exercise,  communication aids, aphasia and vital stem.  Case      management social work will assess and treat for psychosocial      issues and discharge planning as appropriate.  Team conferences      will be held weekly to establish goals, assess progress and      determine barriers to discharge.   ESTIMATED LENGTH OF STAY:  Two to three weeks.   GOALS:  Standby assist to modified independent ADLs, transfers and  ambulation.   PROGNOSIS:  Good.           ______________________________  Ellwood Dense, M.D.     DC/MEDQ  D:  11/03/2007  T:  11/03/2007  Job:  [id number]

## 2010-07-11 NOTE — Discharge Summary (Signed)
NAMESHANTEL, HELWIG           ACCOUNT NO.:  [id number]   MEDICAL RECORD NO.:  [id number]          PATIENT TYPE:  IPS   LOCATION:  4005                         FACILITY:  [facility]   PHYSICIAN:  Ranelle Oyster, M.D.DATE OF BIRTH:  11/04/1918   DATE OF ADMISSION:  11/03/2007  DATE OF DISCHARGE:  11/10/2007                               DISCHARGE SUMMARY   DISCHARGE DIAGNOSIS:  1. Left middle cerebral artery infarct with aphasia.  2. Hypertension.  3. Atrial fibrillation, rate-controlled.  4. Congestive heart failure, compensated.  5. Acute renal insufficiency, resolved.  6. Candida treated.   HISTORY OF PRESENT ILLNESS:  Ms. Pelle is an 77 year old female with  history of hypertension admitted on October 26, 2007, with sudden onset  of right facial droop, aphasia, and right-sided weakness.  On arrival to  ED, the patient was noted to have improvement in the weakness, but  continued to have issues with aphasia.  CT of the head showed no acute  changes and the patient treated with TPA.  She did have some issues with  shortness of breath and hypoxia with hypertension requiring intubation  at p.m.  Repeat CCT showed left MCA infarct without hemorrhage.  MRI/MRA  of brain showed left MCA infarct involving anterior insulin and frontal  cortex.  Carotid Dopplers done showed no ICA stenosis.  The 2D echo done  showed EF of 65%-70%.  The patient was treated with IV diuretics for  fluid overload and did require dopamine secondary to hypertension.  She  was noted to have issues with AFib with RVR requiring addition of  amiodarone and Cardizem.  She currently continues on amiodarone.  She  was extubated on October 28, 2007, without difficulty.  Coumadin added  on October 30, 2007, for embolic left MCA infarct.  Swallow eval done  showed the patient with delaying swallow with aspiration of thin liquids  and currently she is on D3 diet with nectar liquids.  Therapies are  ongoing and the  patient is noted to have decreased attention to the  right delayed processing and question right homonymous hemianopsia with  decrease verbal output.  Rehab consulted for progressive therapies.   Past medical history significant for hypertension, left bundle-branch  block, partial thyroidectomy, laparoscopic cholecystectomy, TIAs, right  lower extremity sciatica, history of bradyarrhythmia, and asymmetrical  septal hypertrophy.   Allergies are to SULFA and CODEINE.   Family history is positive for coronary artery disease.   SOCIAL HISTORY:  The patient lives with daughter and son-in-law in two-  level home with one-step at entry.  Does not use any tobacco or alcohol.  Family has a housekeeper and a friend who checks on her twice a day.   FUNCTIONAL HISTORY:  The patient was independent and walks 30-45 minutes  daily.  She is independent and still drive for short distances.   FUNCTIONAL STATUS:  The patient is min assist for transfers, min assist  for ambulating 200 feet, currently has receptive and expressive aphasia  with perseveration and inconsistency.  She is at min assist for ADLs.   PHYSICAL EXAMINATION:  GENERAL:  The patient is a  well-nourished, well-  developed older female in no acute distress.  HEENT:  Atraumatic and normocephalic.  Extraocular movements are intact.  Nares are patent.  Tongue is midline.  LUNGS:  Clear to auscultation bilaterally without wheezes or rhonchi.  CARDIOVASCULAR:  Irregular rate rhythm without gallops.  Soft murmur.  ABDOMEN:  Soft and nontender with positive bowel sounds.  NEUROLOGIC:  The patient is alert and oriented to self.  Verbal output  is limited with flat affect.  Poor eye contact.  She follows simple one-  step command with occasional cues.  Moves all four extremities.  Some  inattention to right side with question right heminomonus heminopsia.  Right upper and right lower extremity strength generally 3+ to 4-/5.  Bulk decreased to  normal, left upper extremity and left lower extremity  with generally 4/5.   HOSPITAL COURSE:  Ms. Sujey Gundry was admitted to rehab on  November 03, 2007, for inpatient therapies to consist of PT/OT and  speech therapy at least 3 hours 5 days a week.  During her stay, a team  conference was held to assess progress and set up goals as well as  address barriers to discharge.  A rehab MD has been following for  medical management.  At the time of admission, the patient was noted to  have issues with candidal infection around inguinal and gluteal areas.  Initially, this was treated with nystatin cream.  As no improvement with  this, she was treated with short course of Diflucan for this.  During  her stay in rehab, routine check of lytes have been monitored along  especially due to diuretic on board.  Initially, the patient was  maintained on D3 diet nectar-thick liquids.  BUN/creatinine at admission  was 22 and 0.84.  No IV fluids were used for hydration due to her  history of CHF.  She did have repeat swallow study done on November 06, 2007, at which time she was advanced to regular diet thin liquids.  Recheck lytes from day of discharge showed sodium 134, potassium 5.0,  chloride 102, CO2 of 28, BUN 23, creatinine 0.9, glucose 93.  CBC at the  time of admission revealed hemoglobin 11.6, hematocrit 34.3, white count  8.5, and platelets 165.  A UA/UC&S was sent at admission and this showed  multi species.  The patient was maintained on Coumadin for CVA  prophylaxis during her stay.  PT/INR at the time of discharge are  therapeutic with INR at 2.7.  The patient is discharged on 1 mg Coumadin  a day.  Of note, the patient was noted to have some intermittent fevers  during this stay.  She was noted to have a temperature of 100.8 on  November 04, 2007, and repeat UA/UC was done.  Urine culture currently  pending.  She spiked a temperature of 101 on November 08, 2007.  Question due to  the PICC line in place.  Chest x-ray was done as well as  UA/UC was sent off.  Chest x-ray done showed a cardiomegaly with chronic  interstitial changes.  Urine culture currently is pending.  Question  PICC line has source of infection.  PICC discontinued on November 10, 2007, and PICC was sent for culture.  We will contact family if either  urine culture or culture from the site is positive.   During her stay in rehab, rehab RN has been working with family to  toilet patient as well as perform assist his bladder and  bowel training,  skin care monitoring and wound care monitoring.  PO intake has been  encouraged with family providing additional food from home to supplement  her meals.  The physiatrist, rehab RN, as well as therapy team have been  working together to provide customized collaborative interdisciplinary  care during this stay.  The patient's daughter has been present for most  therapies and has been very supportive and hands-on.  At the time of  admission, the patient was noted to be limited by fatigue with poor  sustained attention and minimal verbal expression requiring max  encouragement for functional mobility with ADLs.  She was at min assist  with mod cues for bathing and dressing.  The patient has made steady  gains with skilled OT.  Family has been educated to decrease amount of  care they provide the patient and to let the patient have increased time  to perform and initiate tasks with decrease verbal cuing to maximize the  patient's independence.  Family's decision has been to perform most of  these tasks for her.  Family has been educated regarding these tasks as  well as using of DME for self-care tasks.  At the time of admission, the  patient was supervision for bed mobility, min assist for transfers, min  assist for gait with decrease in stance tone to the right with mild  unsteadiness.  The patient preferred handheld assist with the use of  cane.  The  patient's endurance continues to be impaired.  She is  currently at min assist for sit to stand transfers.  She is able to  ambulate with min assist for 70-150 feet.  The patient's gait shows  increasing shuffling pattern with flexion with fatigue.  She requires  breaks for most activities.  The patient will continue to receive  further followup home health PT past discharge.  Speech therapy has been  working with the patient to assist with aphasia treatment as well as  dysphagia treatment.  Currently, the patient is tolerating D3 diet thin  liquids without difficulty.  At the time of admission, the patient was  max assist for basic.  She has no questions with inconsistent response,  max assist for basic factual information, max assist for functional  commands for ADLs with mod to max assist for single word discrimination  in field of 6.  She required max to total assist for a complex  information.  Total assist for responsive naming.  Verbal output is  elicited with cues with length of utterances limited.  Decrease in vocal  intensity with poor articulation at word and phrase level.  The patient  is currently following one-step functional commands during ADLs with min  assist to max assist to express basic wants and needs using nonverbal  language with single word utterances, mod to max assist to increase  vocal intensity and over articulate.  Mod assist for basic problem  solving skills with ADLs.  Family will provide 24-hour supervision  assistance past discharge.  The patient will continue with further  followup home health PT, OT, speech therapy by [facility].  Home health  RN has been arranged for protime draws with next protime draw on the  November 12, 2007, with results to [facility] at [facility].  On  November 10, 2007, the patient is discharged to home.   DISCHARGE MEDICATIONS:  1. Amiodarone 200 mg p.o. per day.  2. Coumadin 1 mg p.o. q.p.m. starting November 11, 2007.   3. Nexium  40 mg day.  4. Lasix 20 mg a day.  5. Diovan 80 mg nightly. 80 plus 40 mg in a.m.  6. Multivitamin one per day.   Diet is regular.  No salt.  Needs to sit upright 30-60 minutes after  meals.   SPECIAL INSTRUCTIONS:  A 24-hour supervision.  No strenuous activity.  No alcohol, no smoking, no driving.  Do not use aspirin.  [facility] to  provide PT/OT, speech therapy, and RN.   FOLLOWUP:  The patient to follow up with Dr. Riley Kill on December 10, 2007,  at 10 a.m.  Follow up with Dr. Juanda Chance on November 26, 2007, at 3 p.m.  Follow up with Dr. Sandria Manly in 9 days.      Greg Cutter, P.A.      Ranelle Oyster, M.D.  Electronically Signed    PP/MEDQ  D:  11/10/2007  T:  11/11/2007  Job:  [id number]   cc:   Everardo Beals. Juanda Chance, MD, [facility]  Pramod P. Pearlean Brownie, MD

## 2010-07-11 NOTE — Discharge Summary (Signed)
NAMEKATNISS, WEEDMAN           ACCOUNT NO.:  [id number]   MEDICAL RECORD NO.:  [id number]          PATIENT TYPE:  INP   LOCATION:  2019                         FACILITY:  [facility]   PHYSICIAN:  Duke Salvia, MD, FACCDATE OF BIRTH:  10/14/1918   DATE OF ADMISSION:  11/18/2007  DATE OF DISCHARGE:  11/21/2007                               DISCHARGE SUMMARY   ALLERGIES:  This patient has allergy to SULFA and CODEINE.   Time for this dictation greater than 25 minutes.   FINAL DIAGNOSES:  1. Discharged in day #2, status post implant of a [employer] Jude ZEPHYR dual-      chamber pacemaker set at DDD.      a.     Symptomatic bradycardia.   SECONDARY DIAGNOSES:  1. Recent left middle cerebral artery stroke.  2. Paroxysmal atrial fibrillation, chronic Coumadin.  3. Left bundle-branch block.  4. Ejection fraction of 65%.  5. Hypertension.  6. Status post thyroidectomy.  7. Status post laparoscopic cholecystectomy.  8. Debilitation.   PROCEDURE:  November 19, 2007, implant of the [place]. Jude ZEPHYR dual-  chamber pacemaker by Dr. Sherryl Manges for sinus node  dysfunction/symptomatic bradycardia.   BRIEF HISTORY:  Ms. Diclemente is an 77 year old female.  She has a  history of left bundle-branch block and bradyarrhythmia.  She suffered a  left middle cerebral artery stroke earlier in September and was  discharged on November 10, 2007.  She had aphasia, right facial droop,  and right-sided weakness.  She was treated with IV tPA.   The patient had a 2D echocardiogram that admission showed an ejection  fraction of 65-70%, no obvious cardiogenic source of embolus.  The  carotid Doppler did not show any internal carotid artery stenoses.  The  patient did show some atrial fibrillation, and she was placed on  amiodarone.   Since discharge, she has been running a slow heart rate.  The family has  talked to Dr. Sandria Manly and Dr. Juanda Chance.  The daughter mentions that over the  last 48 hours, the patient  has been more restless.  She responded to  things appropriately, but does not act her normal self.  She has not had  syncope.  She denies chest pain, shortness of breath, paroxysmal  nocturnal dyspnea.  EKG in the office, November 18, 2007, shows  junctional rhythm with a rate of 45 beats per minute.  The patient also  has a history of hypertension, left bundle-branch block, status post  partial thyroidectomy, and history of laparoscopic pericholecystectomy.   The plan will be to admit the patient to cardiac intensive care step-  down unit and electrophysiology will be consulted for possible pacemaker  implantation.   HOSPITAL COURSE:  The patient presents from the office on November 18, 2007, with symptomatic bradycardia with history of atrial fibrillation  and possible tachybrady syndrome as well as sinus node dysfunction.  The  patient has a pacemaker implanted on November 19, 2007, this was a dual-  chamber ZEPHYR in DDD mode.  The patient was set to go home  postprocedure day #1 but due to her age and  slight debilitation, she was  kept for an extra 48 hours with cardiac rehab ambulation.  The patient  was asked to keep her incision dry for the next 7 days and to be careful  with her left arm movements.  Her medications at discharge are, Tylenol  325 mg 1 tab every 6 hours as needed, her Lasix, amiodarone, and Diovan  have all been held for now.  The family may add back Diovan as blood  pressure requires, assuming if systolic blood pressure greater than 140  mmHg.  The decision is to restart amiodarone would be up to Dr. Juanda Chance  in the office when he sees her.  This will also include perhaps  restarting Lasix if necessary.  The patient's ejection fraction is 65%,  but she may have an element of diastolic dysfunction.   FOLLOWUP APPOINTMENTS:  With [facility].  She will see Dr.  Juanda Chance, Thursday, December 11, 2007, at 8:45, with the [facility],  Thursday, December 11, 2007, at 9:40, and she will see Dr. Juanda Chance once  again in January 2010, his office will call with that appointment.   LABORATORY STUDIES:  This admission, on the day of discharge, her  protime is 23 and INR is 1.0.  Her complete blood count this admission,  white cells 7.7, hemoglobin 11.7, hematocrit 34.1, and platelets are  207.  Sodium is 136, potassium 4.7, chloride 104, carbonate 28, BUN is  27, creatinine is 0.84, and glucose 89.  TSH this admission was 4.902,  alkaline phosphatase 129, SGOT 51, and SGPT is 46.  The patient will  continue her Coumadin as she has been taking this.      Maple Mirza, [place]      Duke Salvia, MD, [facility]  Electronically Signed    GM/MEDQ  D:  11/21/2007  T:  11/21/2007  Job:  [id number]   cc:   Everardo Beals. Juanda Chance, MD, [facility]  Genene Churn. Love, M.D.

## 2010-07-11 NOTE — Assessment & Plan Note (Signed)
[facility]                            CARDIOLOGY OFFICE NOTE   NAME:Dawn Barrett                  MRN:          [id number]  DATE:11/26/2007                            DOB:          25-Oct-1918    CLINICAL HISTORY:  Dawn Barrett returned for a followup visit after  a recent hospitalization and recent rehospitalization for a pacemaker.  She was admitted with a stroke and underwent emergency t-PA.  She has  had primarily a speech deficit.  She was left with little residual  weakness.  She also was found to have paroxysmal atrial fibrillation,  which was treated with IV amiodarone and p.o. amiodarone.  She also had  diastolic heart failure and hypertension.  She was discharged home  fairly stable, but she developed increasing bradycardia with junctional  rates down to 30s and she was brought back to the hospital and underwent  a pacemaker implantation by Dr. Graciela Husbands.  She has done fairly well since  that time, although her blood pressure has been up and down with  pressures as high as 190 and the low as 119.  According to [facility], her  pressures seem to be highest right before her next dose.  She has had no  recurrent tachy palpitations.  She has also had no recent fluid  accumulation.  She was not discharged on Lasix.   PHYSICAL EXAMINATION:  VITAL SIGNS:  Blood pressure is 131/79, pulse 73  and regular.  NECK:  There was no venous tension.  The carotid pulses were full  without bruits.  CHEST:  Clear without rales or rhonchi.  CARDIOVASCULAR:  The cardiac rhythm is regular.  Heart sounds were  normal and no murmurs or gallops.  The pacer site was well healed with  Steri-Strips in place.  ABDOMEN:  Soft with normal bowel sounds.  There is no peripheral edema.  EXTREMITIES:  Pedal pulses are equal.   LABORATORY DATA:  Electrocardiogram showed pacing at both chambers.   IMPRESSION:  1. Recent left brain stroke felt to be embolic.  2.  Paroxysmal atrial fibrillation.  3. History of diastolic heart failure, now compensated without      diuretic.  4. Hypertension, difficult to control.   RECOMMENDATIONS:  I think, [name] is doing fairly well.  She does  not appear to have any fluid accumulation despite no diuretics.  Her  blood pressure has been up and down and we will plan to try her on  Diovan 40 t.i.d. to see if we can get a smoother approach since her  blood pressure  is seem to be elevated more just before her next dose.  I set a parameter of 130-160 for a systolic range and less than 90 for  diastolic.  If she is out of these ranges, then we will make  adjustments.  We discussed the issue of amiodarone.  We had some  concerns about amiodarone because of potential side effects, but I think  this would be the best thing for her.  It appears to have worked over  the past several weeks and I think we can get  by with a low dose of 100  a day.  Also, rate control was difficult when she went back into atrial  fibrillation, and we should not worry about the bradycardia at this  point with amiodarone.  We will plan to have her seen in Pacemaker  Clinic in 2 weeks, and at that time we will try and get her so she is  not  pacing her ventricle so much.  I will see her back in 3 months and we  will adjust her thresholds.  Our Coumadin Clinic is managing her  Coumadin.     Bruce Elvera Lennox Juanda Chance, MD, [facility]  Electronically Signed    BRB/MedQ  DD: 11/26/2007  DT: 11/27/2007  Job #: [id number]   cc:   Genene Churn. Love, M.D.

## 2010-07-11 NOTE — Assessment & Plan Note (Signed)
[facility]                            CARDIOLOGY OFFICE NOTE   NAME:Dawn Barrett, Dawn Barrett                  MRN:          [id number]  DATE:03/05/2008                            DOB:          December 06, 1918    NEUROLOGIST:  Dr. Avie Echevaria.   CLINICAL HISTORY:  Dawn Barrett is an 77 years old and came in for an  unscheduled visit today after seeing Dr. Sandria Manly earlier today.  She has  been having a cough over the past 2 weeks and was treated with  Zithromax.  Her cough improved, but is still present, and she saw Dr.  Sandria Manly back today.  He called me and she came down for an x-ray and we saw  her after her x-ray today.  She has had no chest pain or palpitations.   She was in the hospital with a stroke a few months ago, was treated with  tPA and she had a remarkable recovery.  Her speech is still somewhat  slow, but notably otherwise she seems to have recovered.  She also had  atrial fibrillation and has been on amiodarone since that time.  She has  also had a history of diastolic heart failure, was on Lasix for a period  of time, but has not been on it recently.   PAST MEDICAL HISTORY:  Significant for hypertension, which is somewhat  difficult to control.   PHYSICAL EXAMINATION:  There was no vein distention.  The carotid pulses  were full without bruits.  CHEST:  Clear.  CARDIAC:  Rhythm was regular.  He has a short systolic murmur at the  left sternal edge.  There was no peripheral edema.   IMPRESSION:  1. Recent bronchitis with persistent cough.  2. Paroxysmal atrial fibrillation.  3. Recent stroke treated with tissue plasminogen activator with      excellent recovery.  4. Hypertension.  5. History of diastolic heart failure.   RECOMMENDATIONS:  The x-ray today showed no evidence of congestive heart  failure or pneumonia.  I discussed with Dr. Sandria Manly earlier and he gave a  prescription for Tussionex.  I am hopeful that her symptoms resolve over  the  next week with time and with Tussionex.  We will not add an  antibiotic.  I think it is unlikely this is amiodarone, but I think I  will plan to cut back the  dose from 200 to 100.  She has an appointment to be seen later this  month for a pacemaker check.     Bruce Elvera Lennox Juanda Chance, MD, [facility]  Electronically Signed    BRB/MedQ  DD: 03/05/2008  DT: 03/06/2008  Job #: [id number]   cc:   Evie Lacks, MD

## 2010-07-11 NOTE — Discharge Summary (Signed)
NAMEDEBORH, Dawn Barrett           ACCOUNT NO.:  [id number]   MEDICAL RECORD NO.:  [id number]          PATIENT TYPE:  INP   LOCATION:  3110                         FACILITY:  [facility]   PHYSICIAN:  Pramod P. Pearlean Brownie, MD    DATE OF BIRTH:  1919-01-02   DATE OF ADMISSION:  10/26/2007  DATE OF DISCHARGE:  11/03/2007                               DISCHARGE SUMMARY   ADMISSION DIAGNOSIS:  Aphasia.   DISCHARGE DIAGNOSES:  1. Left anterior division middle cerebral artery infarction secondary      to cardiogenic embolism from paroxysmal atrial fibrillation treated      with thrombolysis with intravenous tissue plasminogen activator.  2. Paroxysmal atrial fibrillation, new onset.  3. Congestive heart failure.  4. Hypertension.   HOSPITAL COURSE:  Dawn Barrett is an 77 year old pleasant Caucasian lady  who was admitted to [facility] with symptoms of sudden onset of  witnessed aphasia, right facial droop, and right-sided weakness by  family at home at around 1:30 p.m.  EMS was alerted almost immediately,  and the patient was brought to [facility] where code  stroke was called in the field.  The patient noted as having some  improvement in the hemiparesis but still having significant aphasia and  inability to follow commands.  Upon arrival in the emergency room, a CT  scan of the head was performed, which showed no acute abnormalities,  hemorrhages, or dense MCA sign.  The patient met inclusion/exclusion  criteria for thrombolysis with IV t-PA, which was administered  uneventfully.  The patient was subsequently admitted to the Neurological  Intensive Care Unit where her blood pressure was tightly controlled.  She obtained some improvement in her aphasia and had no significant  right-sided weakness.  On the second day, she developed respiratory  distress and required intubation for airway protection.  She was  intubated for just a couple of days and subsequently for 2-3  days.  Her  CT scan showed at that time no worsening but showed a left MCA frontal  branch infarct.  Subsequently, an MRI scan was performed, which  confirmed anterior division left MCA infarct without any hemorrhagic  transformation.  MRA showed occlusion of the anterior division of the  left middle cerebral artery at its origin.  The patient was felt to be  at high risk for hemorrhage and hence thrombolysis was not performed.  The patient went into atrial fibrillation after the initial intubation  and subsequently became hypotensive requiring vasopressor support for a  short period of 24 hours.  Subsequently, blood pressure came up and this  was discontinued.  Critical Care Services were consulted for airway  management as well as medical management.  [facility]  was consulted for her cardiac management and saw her throughout the  hospital stay.  She was extubated and after about 48 hours and did  initially well for 24 hours but overnight was also found to be again in  respiratory distress with signs of pulmonary edema and congestive heart  failure.  Critical Care and Cardiology followed the patient closely and  managed her medically and  she avoided reintubation.  The patient was  subsequently seen by physical, occupational and speech therapy as well  as Rehab services and was felt to be a good candidate for inpatient  rehab.  She remained in the ICU until the day of discharge, and on the  day of discharge, she was found to be able to sit up without assistance  and she had nonfluent speech with expressive aphasia.  She had good  comprehension, could follow 1- and 2-step commands, but not 3-step  commands.  She was able to name and repeat and spoke short sentences but  requiring verbal cues.  She was afebrile and the vital signs were stable  at the time of discharge.  Rest of the tests, which were done included  an EEG, which showed focal left hemispheric slowing.  A 2-D   echocardiogram, which showed normal ejection fraction ranging of 65-70%  without definite cardiac source of embolism.  Carotid ultrasound showed  no hemodynamically significant extracranial stenosis.  Hemoglobin A1c  was normal at 5.7.  Lipid profile was normal at total cholesterol 184,  triglyceride 74, HDL 77, LDL 92.  Homocystine was normal at 8.6.  Cardiac enzymes remained within normal limits.  Electrolytes and WBC  count also normal.  She was started on Coumadin for secondary stroke  prevention, and on the day of discharge, her INR was optimal at 2.4 with  prothrombin time of 28.1.  Her BMP and CBC one day prior to discharge  were both normal.  At the time of discharge, her medications were:  1. Amiodarone 400 mg twice a day.  2. Nystatin ointment topically apply to upper thighs twice a day.  3. Protonix 40 mg twice a day.  4. Diovan 40 mg twice a day.  5. Coumadin dose per Pharmacy to keep INR 2-3.   She was cleared for diet with thickened with food thickeners.   FOLLOWUP ADVICE:  To follow up with Dr. Sandria Manly in the office in a month  and with her cardiologist, Dr. Charlies Constable in a month after discharge.           ______________________________  Dawn Barrett. Pearlean Brownie, MD     PPS/MEDQ  D:  11/03/2007  T:  11/04/2007  Job:  [id number]

## 2010-07-11 NOTE — Assessment & Plan Note (Signed)
[facility]                                 ON-CALL NOTE   NAME:Dawn Barrett, Dawn Barrett                  MRN:          [id number]  DATE:11/17/2007                            DOB:          Jun 25, 1918    PRIMARY CARDIOLOGIST:  I believe is Dr. Charlies Constable.   I was contacted by Dr. Sandria Manly this evening because Dawn Barrett's heart  rate is going slow.  The family was concerned because her heart rate was  in the 40s.  Dr. Juanda Chance had already been verbally consulted about this  and requested that they quit checking her heart rate at night when it  was in the 40s.  The family is now reporting that her heart rate is in  the 40s during the day and they are concerned.  Of note, her blood  pressure is within normal limits and in fact on the high-side running  frequently in the 150s systolic.  She is asymptomatic by family report.  I discussed the situation with Dr. Sandria Manly.  He stated that he was  reluctant to stop the amiodarone and that is appropriate since she is  taking it for AFib with rapid ventricular response.  I requested that he  advise the family to call 911 and have her brought to the emergency room  for symptomatic bradycardia or hypotension.  In the meantime, I have  left a message with the office for her to come in for a followup  appointment and assessment of her bradycardia.  No further med changes  will be made at this time.      Theodore Demark, PA-C  Electronically Signed      Rollene Rotunda, MD, [facility]  Electronically Signed   RB/MedQ  DD: 11/17/2007  DT: 11/18/2007  Job #: [id number]

## 2010-07-11 NOTE — Op Note (Signed)
NAMEBABY, GIEGER           ACCOUNT NO.:  [id number]   MEDICAL RECORD NO.:  [id number]          PATIENT TYPE:  INP   LOCATION:  2908                         FACILITY:  [facility]   PHYSICIAN:  Duke Salvia, MD, FACCDATE OF BIRTH:  04-12-1918   DATE OF PROCEDURE:  11/19/2007  DATE OF DISCHARGE:                               OPERATIVE REPORT   PREOPERATIVE DIAGNOSIS:  Symptomatic sinus node dysfunction.   POSTOPERATIVE DIAGNOSIS:  Symptomatic sinus node dysfunction.   PROCEDURE:  Dual-chamber pacemaker implantation and contrast venogram.   DESCRIPTION OF PROCEDURE:  After obtaining the informed consent, the  patient was brought to the Catheterization Laboratory and placed on the  fluoroscopic table in supine position. After routine prep and drape of  the left upper chest, lidocaine was infiltrated in prepectoral  subclavicular region.  The incision was made and carried down to the  layer of the prepectoral fascia using electrocautery and sharp  dissection.  A pocket was formed similarly.  Hemostasis was obtained.   Thereafter, attention was turned to gain access to the extrathoracic  left subclavian vein, which was accomplished with moderate difficulty.  In deed after a number of venipunctures today, we ended up doing a  contrast venogram with the left subclavian vein demonstrating that the  main course is actually somewhat caudal to what I had anticipated.  We  were then able to make 2 separate venipunctures without the aspiration  or puncture of the artery.  Guidewires were placed and retained, and  sequentially 7-French sheaths were placed, which was passed the St. Jude  1646T, 52-cm passive fixation ventricular lead, serial # [id number] and a  Medtronic 5076, 45-cm active fixation lead, serial # [id number].  Under fluoroscopic guidance, the initial lead was manipulated in the  right ventricular apex where the bipolar R-wave was 26.3 with a pace  impedance of 1012 ohms with  threshold 0.5 volts at 0.5 milliseconds.  Current threshold is 0.5 mA.  There is no diaphragmatic pacing at 10  volts.  This lead was secured to the prepectoral fascia.   We then turned our attention to deploying the atrial lead.  Now I was  not able to pass the J-wire past the junction of the innominate and the  SVC.  I then took a straight wire, put a J into it which was a little  bit more gentle and then placed it in the lead into that point into the  mouth of the right atrial appendage where the bipolar P wave was 11.9  with the pace impedance of 726 ohms with a threshold of 1.2 volts at 0.5  milliseconds.  Currently threshold was 1.9 mA.  There was again no  diaphragmatic pacing at 10 volts and the current of injury was moderate.  This lead was secured to the prepectoral fascia, leads were then  attached to a St. Jude Zephyr 5020 pulse generator, serial [id number].  Ventricular pacing and then atrial pacing were identified.  The pocket  was copiously irrigated with antibiotic-containing saline solution.  Hemostasis was assured.  The leads and the pulse generator were placed  in the pocket,  secured to the prepectoral fascia.  The wound was then  closed in 3 layers in normal fashion with a 4-0 and a ___________ 3-0.  The wound was then washed, dried, and Benzoin and Steri-Strip dressing  was applied.  Needle counts, sponge counts, and instrument counts were  correct at the end of the procedure according to the staff. The patient  tolerated the procedure without apparent complication.   The patient's dopamine was cut into half at the end of the procedure.   The dopamine will be weaned overnight.      Duke Salvia, MD, [facility]  Electronically Signed     SCK/MEDQ  D:  11/19/2007  T:  11/20/2007  Job:  [id number]

## 2010-07-11 NOTE — Assessment & Plan Note (Signed)
[facility]                                 ON-CALL NOTE   NAME:Barrett, Dawn                    MRN:          [id number]  DATE:10/27/2007                            DOB:          1919/02/16    PRIMARY CARDIOLOGIST:  Everardo Beals. Juanda Chance, MD, [facility]   I received the output page through the answering service from [facility], telephone number [phone] concerning Dawn Barrett.  I  promptly called [name] back.  She stated her mother is the patient,  stated above, is in 3100.  She has had a stroke and she is having atrial  fibrillation.  [name] is calling insisting that we see the patient.  [name] states she is a personal friend of Dr. Regino Schultze and insistent  that we evaluate the patient for the atrial fib.  I spoke with Ms.  Hunter briefly, tried to relate to her that I would need to speak with  the attending physician and communicate to them the family's request to  obtain a formal consult.  [name] was pleasant, but was not pleased  with my response.  I stated that I would follow up on it immediately and  get back to her.  I then called Dr. Vickey Huger and asked her if she was  familiar with the patient.  She stated that she was.  I told her what  the conversation had transpired between myself and her daughter.  Dr.  Vickey Huger stated that it will be fine if we would be willing to consult  on the patient.  I then spoke with Dr. Nicholes Mango, and Dr. Gala Romney  and myself saw the patient in consultation with [name] present in  the room.      Dorian Pod, ACNP  Electronically Signed      Bevelyn Buckles. Bensimhon, MD  Electronically Signed   MB/MedQ  DD: 10/31/2007  DT: 10/31/2007  Job #: [id number]
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
## 2010-11-07 ENCOUNTER — Encounter: Payer: Self-pay | Admitting: *Deleted

## 2010-11-27 LAB — COMPREHENSIVE METABOLIC PANEL
ALT: 46 — ABNORMAL HIGH
AST: 51 — ABNORMAL HIGH
Alkaline Phosphatase: 129 — ABNORMAL HIGH
CO2: 26
Calcium: 8.6
Chloride: 101
GFR calc Af Amer: 40 — ABNORMAL LOW
GFR calc non Af Amer: 33 — ABNORMAL LOW
Glucose, Bld: 119 — ABNORMAL HIGH
Potassium: 5.3 — ABNORMAL HIGH
Sodium: 134 — ABNORMAL LOW

## 2010-11-27 LAB — BASIC METABOLIC PANEL
BUN: 27 — ABNORMAL HIGH
Chloride: 104
GFR calc non Af Amer: >60
Glucose, Bld: 89
Potassium: 4.7
Sodium: 136

## 2010-11-27 LAB — CBC
HCT: 34.1 — ABNORMAL LOW
Hemoglobin: 11.7 — ABNORMAL LOW
Hemoglobin: 12.4
MCHC: 33.9
MCV: 96.7
RBC: 3.74 — ABNORMAL LOW
RDW: 15
WBC: 7
WBC: 7.7

## 2010-11-27 LAB — MAGNESIUM: Magnesium: 2.7 — ABNORMAL HIGH

## 2010-11-27 LAB — PROTIME-INR
INR: 1.9 — ABNORMAL HIGH
Prothrombin Time: 23 — ABNORMAL HIGH

## 2010-11-27 LAB — APTT: aPTT: 32

## 2010-11-29 LAB — PROTIME-INR
INR: 1.7 — ABNORMAL HIGH
INR: 2.7 — ABNORMAL HIGH
INR: 2.8 — ABNORMAL HIGH
Prothrombin Time: 15.4 — ABNORMAL HIGH
Prothrombin Time: 21.3 — ABNORMAL HIGH
Prothrombin Time: 26.7 — ABNORMAL HIGH
Prothrombin Time: 28.1 — ABNORMAL HIGH
Prothrombin Time: 31.3 — ABNORMAL HIGH

## 2010-11-29 LAB — COMPREHENSIVE METABOLIC PANEL
ALT: 44 — ABNORMAL HIGH
AST: 39 — ABNORMAL HIGH
AST: 44 — ABNORMAL HIGH
Albumin: 2.2 — ABNORMAL LOW
Albumin: 2.3 — ABNORMAL LOW
Alkaline Phosphatase: 108
BUN: 22
Chloride: 100
Chloride: 101
Creatinine, Ser: 0.87
Creatinine, Ser: 1.16
GFR calc Af Amer: 53 — ABNORMAL LOW
GFR calc Af Amer: >60
Potassium: 4.6
Potassium: 5
Sodium: 136
Total Bilirubin: 0.3
Total Protein: 6.1

## 2010-11-29 LAB — BASIC METABOLIC PANEL
BUN: 23
BUN: 23
BUN: 28 — ABNORMAL HIGH
BUN: 30 — ABNORMAL HIGH
BUN: 32 — ABNORMAL HIGH
CO2: 24
CO2: 24
CO2: 28
CO2: 28
CO2: 31
Calcium: 7.8 — ABNORMAL LOW
Calcium: 8.3 — ABNORMAL LOW
Calcium: 8.4
Calcium: 8.5
Chloride: 101
Chloride: 102
Chloride: 106
Chloride: 106
Creatinine, Ser: 0.9
Creatinine, Ser: 0.92
Creatinine, Ser: 1.05
Creatinine, Ser: 1.11
Creatinine, Ser: 1.19
GFR calc Af Amer: >60
GFR calc non Af Amer: 46 — ABNORMAL LOW
GFR calc non Af Amer: 46 — ABNORMAL LOW
GFR calc non Af Amer: 48 — ABNORMAL LOW
GFR calc non Af Amer: 50 — ABNORMAL LOW
GFR calc non Af Amer: 50 — ABNORMAL LOW
Glucose, Bld: 137 — ABNORMAL HIGH
Glucose, Bld: 157 — ABNORMAL HIGH
Glucose, Bld: 173 — ABNORMAL HIGH
Glucose, Bld: 209 — ABNORMAL HIGH
Glucose, Bld: 92
Glucose, Bld: 97
Potassium: 3.4 — ABNORMAL LOW
Potassium: 3.6
Potassium: 4.1
Potassium: 4.7
Sodium: 133 — ABNORMAL LOW
Sodium: 136
Sodium: 142
Sodium: 142

## 2010-11-29 LAB — GLUCOSE, CAPILLARY
Glucose-Capillary: 101 — ABNORMAL HIGH
Glucose-Capillary: 114 — ABNORMAL HIGH
Glucose-Capillary: 125 — ABNORMAL HIGH
Glucose-Capillary: 128 — ABNORMAL HIGH
Glucose-Capillary: 130 — ABNORMAL HIGH
Glucose-Capillary: 152 — ABNORMAL HIGH
Glucose-Capillary: 167 — ABNORMAL HIGH
Glucose-Capillary: 168 — ABNORMAL HIGH
Glucose-Capillary: 173 — ABNORMAL HIGH
Glucose-Capillary: 174 — ABNORMAL HIGH
Glucose-Capillary: 175 — ABNORMAL HIGH
Glucose-Capillary: 206 — ABNORMAL HIGH
Glucose-Capillary: 81
Glucose-Capillary: 83
Glucose-Capillary: 90
Glucose-Capillary: 98

## 2010-11-29 LAB — BLOOD GAS, ARTERIAL
Acid-Base Excess: 1.4
Acid-Base Excess: 1.5
FIO2: 0.3
FIO2: 0.4
O2 Saturation: 96
O2 Saturation: 97.2
TCO2: 26.2
pCO2 arterial: 36.9
pO2, Arterial: 87.8

## 2010-11-29 LAB — CBC
HCT: 31 — ABNORMAL LOW
HCT: 33.5 — ABNORMAL LOW
HCT: 33.9 — ABNORMAL LOW
HCT: 34.3 — ABNORMAL LOW
Hemoglobin: 10.5 — ABNORMAL LOW
Hemoglobin: 11.2 — ABNORMAL LOW
Hemoglobin: 11.3 — ABNORMAL LOW
MCHC: 33.6
MCHC: 33.8
MCHC: 34
MCHC: 34.3
MCV: 96.7
MCV: 98.3
Platelets: 120 — ABNORMAL LOW
Platelets: 141 — ABNORMAL LOW
Platelets: 142 — ABNORMAL LOW
Platelets: 165
RBC: 3.36 — ABNORMAL LOW
RBC: 3.4 — ABNORMAL LOW
RDW: 13.9
RDW: 14.1
RDW: 14.1
RDW: 14.2
WBC: 10.1
WBC: 13 — ABNORMAL HIGH
WBC: 7.1
WBC: 8.5
WBC: 9.7
WBC: 9.9

## 2010-11-29 LAB — URINE MICROSCOPIC-ADD ON

## 2010-11-29 LAB — URINE CULTURE
Colony Count: >=100000
Special Requests: NEGATIVE

## 2010-11-29 LAB — URINALYSIS, ROUTINE W REFLEX MICROSCOPIC
Bilirubin Urine: NEGATIVE
Hgb urine dipstick: NEGATIVE
Nitrite: NEGATIVE
Specific Gravity, Urine: 1.006
Specific Gravity, Urine: 1.018
Urobilinogen, UA: 0.2
pH: 6
pH: 6.5

## 2010-11-29 LAB — CLOSTRIDIUM DIFFICILE EIA

## 2010-11-29 LAB — DIFFERENTIAL
Eosinophils Relative: 1
Lymphocytes Relative: 16
Monocytes Absolute: 0.8
Monocytes Relative: 10
Neutro Abs: 6.2

## 2010-11-29 LAB — CATH TIP CULTURE: Culture: NO GROWTH

## 2010-12-11 ENCOUNTER — Encounter: Payer: Self-pay | Admitting: *Deleted

## 2010-12-11 ENCOUNTER — Other Ambulatory Visit: Payer: Self-pay | Admitting: Internal Medicine

## 2010-12-11 ENCOUNTER — Ambulatory Visit (INDEPENDENT_AMBULATORY_CARE_PROVIDER_SITE_OTHER): Payer: Medicare Other | Admitting: *Deleted

## 2010-12-11 ENCOUNTER — Encounter: Payer: Self-pay | Admitting: Internal Medicine

## 2010-12-11 DIAGNOSIS — I495 Sick sinus syndrome: Secondary | ICD-10-CM

## 2010-12-11 LAB — PACEMAKER DEVICE OBSERVATION
AL THRESHOLD: 0.5 V
BAMS-0003: 70 {beats}/min
BATTERY VOLTAGE: 2.79 V
DEVICE MODEL PM: 2040167
RV LEAD AMPLITUDE: 12 mv

## 2010-12-11 NOTE — Progress Notes (Signed)
PPM check 

## 2011-03-05 ENCOUNTER — Other Ambulatory Visit: Payer: Self-pay | Admitting: Internal Medicine

## 2011-05-10 ENCOUNTER — Ambulatory Visit (INDEPENDENT_AMBULATORY_CARE_PROVIDER_SITE_OTHER): Payer: Medicare Other | Admitting: *Deleted

## 2011-05-10 ENCOUNTER — Encounter: Payer: Self-pay | Admitting: Internal Medicine

## 2011-05-10 DIAGNOSIS — I4891 Unspecified atrial fibrillation: Secondary | ICD-10-CM

## 2011-05-10 LAB — PACEMAKER DEVICE OBSERVATION
AL IMPEDENCE PM: 435 Ohm
AL THRESHOLD: 0.75 V
BATTERY VOLTAGE: 2.8 V
VENTRICULAR PACING PM: 1

## 2011-05-10 NOTE — Progress Notes (Signed)
Pacer check in clinic  

## 2011-06-27 HISTORY — PX: FEMUR SURGERY: SHX943

## 2011-07-01 ENCOUNTER — Emergency Department (HOSPITAL_COMMUNITY)
Admission: EM | Admit: 2011-07-01 | Discharge: 2011-07-01 | Disposition: A | Discharge status: Other Acute Inpatient Hospital | Payer: Medicare Other | Attending: Emergency Medicine | Admitting: Emergency Medicine

## 2011-07-01 ENCOUNTER — Encounter (HOSPITAL_COMMUNITY): Payer: Self-pay | Admitting: Nurse Practitioner

## 2011-07-01 ENCOUNTER — Emergency Department (HOSPITAL_COMMUNITY): Payer: Medicare Other

## 2011-07-01 DIAGNOSIS — W108XXA Fall (on) (from) other stairs and steps, initial encounter: Secondary | ICD-10-CM | POA: Insufficient documentation

## 2011-07-01 DIAGNOSIS — Z01818 Encounter for other preprocedural examination: Secondary | ICD-10-CM | POA: Insufficient documentation

## 2011-07-01 DIAGNOSIS — Z7901 Long term (current) use of anticoagulants: Secondary | ICD-10-CM | POA: Insufficient documentation

## 2011-07-01 DIAGNOSIS — S72009A Fracture of unspecified part of neck of unspecified femur, initial encounter for closed fracture: Secondary | ICD-10-CM | POA: Insufficient documentation

## 2011-07-01 DIAGNOSIS — I10 Essential (primary) hypertension: Secondary | ICD-10-CM | POA: Insufficient documentation

## 2011-07-01 DIAGNOSIS — S72001A Fracture of unspecified part of neck of right femur, initial encounter for closed fracture: Secondary | ICD-10-CM

## 2011-07-01 DIAGNOSIS — M25559 Pain in unspecified hip: Secondary | ICD-10-CM | POA: Insufficient documentation

## 2011-07-01 DIAGNOSIS — Z950 Presence of cardiac pacemaker: Secondary | ICD-10-CM | POA: Insufficient documentation

## 2011-07-01 HISTORY — DX: Essential (primary) hypertension: I10

## 2011-07-01 HISTORY — DX: Cerebral infarction, unspecified: I63.9

## 2011-07-01 LAB — COMPREHENSIVE METABOLIC PANEL
BUN: 29 mg/dL — ABNORMAL HIGH (ref 6–23)
CO2: 24 mEq/L (ref 19–32)
Calcium: 8.9 mg/dL (ref 8.4–10.5)
Chloride: 104 mEq/L (ref 96–112)
Creatinine, Ser: 0.94 mg/dL (ref 0.50–1.10)
GFR calc Af Amer: 60 mL/min — ABNORMAL LOW (ref >90)
GFR calc non Af Amer: 51 mL/min — ABNORMAL LOW (ref >90)
Glucose, Bld: 140 mg/dL — ABNORMAL HIGH (ref 70–99)
Total Bilirubin: 0.3 mg/dL (ref 0.3–1.2)

## 2011-07-01 LAB — URINALYSIS, ROUTINE W REFLEX MICROSCOPIC
Bilirubin Urine: NEGATIVE
Hgb urine dipstick: NEGATIVE
Ketones, ur: NEGATIVE mg/dL
Nitrite: NEGATIVE
Urobilinogen, UA: 0.2 mg/dL (ref 0.0–1.0)

## 2011-07-01 LAB — CBC
HCT: 37.2 % (ref 36.0–46.0)
Hemoglobin: 12.5 g/dL (ref 12.0–15.0)
MCV: 97.6 fL (ref 78.0–100.0)
RBC: 3.81 MIL/uL — ABNORMAL LOW (ref 3.87–5.11)
RDW: 15.5 % (ref 11.5–15.5)
WBC: 8.2 10*3/uL (ref 4.0–10.5)

## 2011-07-01 LAB — DIFFERENTIAL
Eosinophils Relative: 0 % (ref 0–5)
Lymphocytes Relative: 20 % (ref 12–46)
Lymphs Abs: 1.6 10*3/uL (ref 0.7–4.0)
Monocytes Absolute: 0.5 10*3/uL (ref 0.1–1.0)
Monocytes Relative: 6 % (ref 3–12)

## 2011-07-01 LAB — APTT: aPTT: 35 seconds (ref 24–37)

## 2011-07-01 LAB — PROTIME-INR: INR: 2.18 — ABNORMAL HIGH (ref 0.00–1.49)

## 2011-07-01 LAB — TYPE AND SCREEN: Antibody Screen: NEGATIVE

## 2011-07-01 MED ORDER — MORPHINE SULFATE 2 MG/ML IJ SOLN
2.0000 mg | Freq: Once | INTRAMUSCULAR | Status: AC
  Administered 2011-07-01: 2 mg via INTRAVENOUS
  Filled 2011-07-01: qty 1

## 2011-07-01 NOTE — ED Provider Notes (Signed)
Patient signed out to me by Dr. Bebe Shaggy patient reportedly had a twisting fall earlier today suffered no head injury was eased to the ground suffered injury to right hip as result of the event the patient has only offers no complaint Work awake answers yes no questions follow simple commands lungs clear auscultation heart regular rate and rhythm abdomen nondistended nontender right lower extremity with shortening and external rotation deep pulse 2+ good capillary refill Patient's son requested transfer to [facility] Spoke with Dr. Genelle Bal BR ITT accepts patient in transfer Diagnosis acute right hip fracture  Date: 07/01/2011  Rate: 70  Rhythm: normal sinus rhythm  QRS Axis: normal  Intervals: normal  ST/T Wave abnormalities: nonspecific ST/T changes  Conduction Disutrbances:left bundle branch block  Narrative Interpretation:  viewed: changes noted EKG from 11/21/07 showed pacecd rhythm,  Results for orders placed during the hospital encounter of 07/01/11  CBC      Component Value Range   WBC 8.2  4.0 - 10.5 (K/uL)   RBC 3.81 (*) 3.87 - 5.11 (MIL/uL)   Hemoglobin 12.5  12.0 - 15.0 (g/dL)   HCT 40.9  81.1 - 91.4 (%)   MCV 97.6  78.0 - 100.0 (fL)   MCH 32.8  26.0 - 34.0 (pg)   MCHC 33.6  30.0 - 36.0 (g/dL)   RDW 78.2  95.6 - 21.3 (%)   Platelets 160  150 - 400 (K/uL)  DIFFERENTIAL      Component Value Range   Neutrophils Relative 73  43 - 77 (%)   Neutro Abs 6.0  1.7 - 7.7 (K/uL)   Lymphocytes Relative 20  12 - 46 (%)   Lymphs Abs 1.6  0.7 - 4.0 (K/uL)   Monocytes Relative 6  3 - 12 (%)   Monocytes Absolute 0.5  0.1 - 1.0 (K/uL)   Eosinophils Relative 0  0 - 5 (%)   Eosinophils Absolute 0.0  0.0 - 0.7 (K/uL)   Basophils Relative 0  0 - 1 (%)   Basophils Absolute 0.0  0.0 - 0.1 (K/uL)  COMPREHENSIVE METABOLIC PANEL      Component Value Range   Sodium 136  135 - 145 (mEq/L)   Potassium 4.7  3.5 - 5.1 (mEq/L)   Chloride 104  96 - 112 (mEq/L)   CO2 24  19 - 32  (mEq/L)   Glucose, Bld 140 (*) 70 - 99 (mg/dL)   BUN 29 (*) 6 - 23 (mg/dL)   Creatinine, Ser 0.86  0.50 - 1.10 (mg/dL)   Calcium 8.9  8.4 - 57.8 (mg/dL)   Total Protein 8.5 (*) 6.0 - 8.3 (g/dL)   Albumin 3.2 (*) 3.5 - 5.2 (g/dL)   AST 46 (*) 0 - 37 (U/L)   ALT 23  0 - 35 (U/L)   Alkaline Phosphatase 125 (*) 39 - 117 (U/L)   Total Bilirubin 0.3  0.3 - 1.2 (mg/dL)   GFR calc non Af Amer 51 (*) >90 (mL/min)   GFR calc Af Amer 60 (*) >90 (mL/min)  TYPE AND SCREEN      Component Value Range   ABO/RH(D) O POS     Antibody Screen NEG     Sample Expiration 07/01/2011    URINALYSIS, ROUTINE W REFLEX MICROSCOPIC      Component Value Range   Color, Urine YELLOW  YELLOW    APPearance CLEAR  CLEAR    Specific Gravity, Urine 1.021  1.005 - 1.030    pH 5.5  5.0 -  8.0    Glucose, UA NEGATIVE  NEGATIVE (mg/dL)   Hgb urine dipstick NEGATIVE  NEGATIVE    Bilirubin Urine NEGATIVE  NEGATIVE    Ketones, ur NEGATIVE  NEGATIVE (mg/dL)   Protein, ur NEGATIVE  NEGATIVE (mg/dL)   Urobilinogen, UA 0.2  0.0 - 1.0 (mg/dL)   Nitrite NEGATIVE  NEGATIVE    Leukocytes, UA NEGATIVE  NEGATIVE   PROTIME-INR      Component Value Range   Prothrombin Time 24.6 (*) 11.6 - 15.2 (seconds)   INR 2.18 (*) 0.00 - 1.49   APTT      Component Value Range   aPTT 35  24 - 37 (seconds)   Dg Chest 2 View  07/01/2011  *RADIOLOGY REPORT*  Clinical Data: Hip fracture, preoperative respiratory exam  CHEST - 2 VIEW  Comparison: February 27, 2008  Findings: Pacemaker leads are intact and stable in position. Chronic interstitial fibrotic changes again noted.  No focal infiltrates or effusions are identified.  The cardiac silhouette and pulmonary vasculature are within normal limits.  Ankylosing degenerative changes are present within the axial spine. Old right rib fractures are noted.  IMPRESSION: Stable chest x-ray with chronic interstitial fibrotic disease.  No acute abnormality.  Original Report Authenticated By: Brandon Melnick, M.D.     Dg Hip Complete Right  07/01/2011  *RADIOLOGY REPORT*  Clinical Data: Hip pain after fall  RIGHT HIP - COMPLETE 2+ VIEW  Comparison: None.  Findings: There is a comminuted intertrochanteric fracture of the proximal right femur with valgus deformity and 1 cm medial displacement of the lesser trochanteric fragment.  The femoral head remains aligned within the acetabulum.  IMPRESSION: Intertrochanteric right femur fracture.  Original Report Authenticated By: Brandon Melnick, M.D.      Doug Sou, MD 07/01/11 [phone]

## 2011-07-01 NOTE — ED Notes (Signed)
Report has been called to receiving RN,  [facility] has been called.  Friend with patient is requesting to ride with the patient

## 2011-07-01 NOTE — ED Provider Notes (Signed)
History     CSN: [id number]  Arrival date & time 07/01/11  1414   First MD Initiated Contact with Patient 07/01/11 1420      Chief Complaint  Patient presents with  . Fall    (Consider location/radiation/quality/duration/timing/severity/associated sxs/prior treatment) HPI Comments: Patient brought in by EMS for right hip pain.  Patient was given Fentnyl for pain by EMS.  Daughter reports that just prior to arrival the patient was walking down the steps and her right leg gave out while walking arm and arm with another women.  Daughter reports that the patient was slowly lowered to the ground after her leg gave out.  She did not hit her head when she fell.  No LOC.  Patient denies headache.  No prior injury to the hip.  She does not have a hip replacement.  PMH significant for CVA x 2 and is currently on Warfarin daily.    The history is provided by the patient (daughter and son-in-law).    Past Medical History  Diagnosis Date  . Stroke   . Hypertension     Past Surgical History  Procedure Date  . Pacemaker insertion     History reviewed. No pertinent family history.  History  Substance Use Topics  . Smoking status: Never Smoker   . Smokeless tobacco: Not on file  . Alcohol Use: No    OB History    Grav Para Term Preterm Abortions TAB SAB Ect Mult Living                  Review of Systems  Constitutional: Negative for fever and chills.  HENT: Negative for neck pain and neck stiffness.   Respiratory: Negative for shortness of breath.   Cardiovascular: Negative for chest pain.  Gastrointestinal: Negative for nausea and vomiting.  Musculoskeletal: Positive for gait problem.       Right hip pain  Neurological: Negative for headaches.  unable to complete ROS due to mental status/dementia  Allergies  Codeine and Sulfonamide derivatives  Home Medications   Current Outpatient Rx  Name Route Sig Dispense Refill  . PACERONE 200 MG PO TABS  TAKE ONE-HALF TABLET DAILY  30 tablet 3  . VALSARTAN 40 MG PO TABS Oral Take 40 mg by mouth daily.    . WARFARIN SODIUM 2 MG PO TABS Oral Take 2 mg by mouth as directed.      BP 180/64  Pulse 68  Temp(Src) 97.8 F (36.6 C) (Oral)  Resp 11  SpO2 88%  Physical Exam  Nursing note and vitals reviewed. Constitutional: She appears well-developed and well-nourished. No distress.  HENT:  Head: Normocephalic and atraumatic.  Mouth/Throat: Oropharynx is clear and moist.  Eyes: EOM are normal. Pupils are equal, round, and reactive to light.  Neck: Normal range of motion. Neck supple.  Cardiovascular: Normal rate, regular rhythm and normal heart sounds.   Pulses:      Posterior tibial pulses are 1+ on the right side, and 1+ on the left side.  Pulmonary/Chest: Effort normal and breath sounds normal. No respiratory distress.  Abdominal: Soft. There is no tenderness.  Musculoskeletal:       Right hip: She exhibits tenderness and bony tenderness. She exhibits no swelling and no laceration.       Right hip shortened and externally rotated.  Did not perform ROM of right hip  Neurological: No sensory deficit.       Patient able to respond yes or no to questions  Skin:  Skin is warm, dry and intact. No laceration noted. She is not diaphoretic.       Good capillary refill of LE bilaterally    ED Course  Procedures (including critical care time)   Labs Reviewed  CBC  DIFFERENTIAL  COMPREHENSIVE METABOLIC PANEL  TYPE AND SCREEN  URINALYSIS, ROUTINE W REFLEX MICROSCOPIC  PROTIME-INR  APTT   No results found.   No diagnosis found.  Patient discussed with Dr. Bebe Shaggy who also evaluated patient.  Patient signed out to Dr. Ethelda Chick.    MDM  Patient comes in today after her right leg gave out while she was walking down the stairs.  She was lowered to the ground after her leg gave out.  She did not hit head.  NO LOC.  No trauma to head visualized.  Labs and xray pending.  Patient neurovascularly intact.  No external  lacerations.          Pascal Lux [place], PA-C 07/01/11 1941   Medical screening examination/treatment/procedure(s) were conducted as a shared visit with non-physician practitioner(s) and myself.  I personally evaluated the patient during the encounter  Patient noted to have hip fx No other injuries Will need admission Care transferred to dr Minda Meo, MD 07/02/11 [phone]

## 2011-07-01 NOTE — ED Notes (Signed)
Patient transferred to [facility] via [facility]

## 2011-07-01 NOTE — ED Notes (Signed)
[facility] here to transport to [facility]

## 2011-07-01 NOTE — ED Notes (Addendum)
Per ems: pt states her R leg "gave out" walking today and caused her to fall. Pt c/o R hip, R knee pain since. ems reports palpable deformity on R posterior hip and pain with palpation. A&O, history of a stroke which she is mostly nonverbal from. No other injuries

## 2011-07-01 NOTE — ED Notes (Signed)
Patient transported to X-ray 

## 2011-12-03 ENCOUNTER — Telehealth: Payer: Self-pay | Admitting: Internal Medicine

## 2011-12-03 NOTE — Telephone Encounter (Signed)
New problem:  Female family member called stating patient in [place] , C/O lethargic . S/p pacer maker. Wanted a recommendation on where to take the patient to get her pacer check . Spoke with Tresa Endo RN - advise patient to go - near emergency, that lethargic would range from a numerous of things. Rely the message family member .

## 2011-12-20 ENCOUNTER — Encounter: Payer: Self-pay | Admitting: *Deleted

## 2011-12-20 DIAGNOSIS — Z950 Presence of cardiac pacemaker: Secondary | ICD-10-CM | POA: Insufficient documentation

## 2011-12-28 ENCOUNTER — Encounter: Payer: Medicare Other | Admitting: Internal Medicine

## 2012-02-15 ENCOUNTER — Other Ambulatory Visit: Payer: Self-pay | Admitting: Internal Medicine

## 2012-04-08 ENCOUNTER — Encounter: Payer: Self-pay | Admitting: *Deleted

## 2012-08-05 ENCOUNTER — Ambulatory Visit (INDEPENDENT_AMBULATORY_CARE_PROVIDER_SITE_OTHER): Payer: Medicare Other | Admitting: Internal Medicine

## 2012-08-05 ENCOUNTER — Encounter: Payer: Self-pay | Admitting: Internal Medicine

## 2012-08-05 VITALS — BP 147/71 | HR 61 | Ht 65.0 in | Wt 120.8 lb

## 2012-08-05 DIAGNOSIS — I4891 Unspecified atrial fibrillation: Secondary | ICD-10-CM

## 2012-08-05 DIAGNOSIS — Z950 Presence of cardiac pacemaker: Secondary | ICD-10-CM

## 2012-08-05 DIAGNOSIS — R0602 Shortness of breath: Secondary | ICD-10-CM

## 2012-08-05 LAB — PACEMAKER DEVICE OBSERVATION
AL AMPLITUDE: 1.8 mv
ATRIAL PACING PM: 83
BAMS-0001: 150 {beats}/min
BAMS-0003: 70 {beats}/min
DEVICE MODEL PM: 2040167
VENTRICULAR PACING PM: 1

## 2012-08-05 LAB — PROTIME-INR: INR: 4 ratio — ABNORMAL HIGH (ref 0.8–1.0)

## 2012-08-05 LAB — HEPATIC FUNCTION PANEL
Alkaline Phosphatase: 117 U/L (ref 39–117)
Bilirubin, Direct: 0.1 mg/dL (ref 0.0–0.3)

## 2012-08-05 MED ORDER — AMIODARONE HCL 200 MG PO TABS: ORAL_TABLET | ORAL | Status: DC

## 2012-08-05 NOTE — Assessment & Plan Note (Signed)
The patient has shortness of breath. She has no cough. The latter makes amiodarone toxicity less likely however it needs to be considered. She has a long-standing history of pulmonary injury from pneumonia. We will decrease the amiodarone dose and follow this along

## 2012-08-05 NOTE — Progress Notes (Signed)
Patient Care Team: Provider Default, MD as PCP - General (Orthopedic Surgery)   HPI  Dawn Barrett is a 79 y.o. 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 She takes amiodarone.surveillance laboratories 5/13 demonstrated a mild persistent elevation of her AST.    her intercurrent history is notable for number of falls; not withstanding she remains on amiodarone; her last TSH is 2009  She denies chest pain or edema. There have been some shortness of breath. There has been no cough  Past Medical History  Diagnosis Date  . Stroke   . Hypertension     Past Surgical History  Procedure Laterality Date  . Pacemaker insertion      Current Outpatient Prescriptions  Medication Sig Dispense Refill  . PACERONE 200 MG tablet TAKE ONE-HALF TABLET DAILY  30 tablet  12  . valsartan (DIOVAN) 40 MG tablet Take 40-80 mg by mouth daily as needed. For blood pressure      . warfarin (COUMADIN) 2 MG tablet Take 1-2 mg by mouth daily. Friday-1 tab (2 mg total), All other days-0.5 half tab (1 mg total)       No current facility-administered medications for this visit.    Allergies  Allergen Reactions  . Codeine   . Sulfonamide Derivatives     Review of Systems negative except from HPI and PMH  Physical Exam BP 147/71  Pulse 61  Ht 5\' 5"  (1.651 m)  Wt 120 lb 12.8 oz (54.795 kg)  BMI 20.1 kg/m2 Well developed and nourished in no acute distress in a wheel chair HENT normal Neck supple with JVP-flat Clear Device pocket well healed; without hematoma or erythema Regular rate and rhythm, no murmurs or gallops Abd-soft with active BS No Clubbing cyanosis edema Skin-warm and dry A & Oriented  Grossly normal sensory and motor function   ECG demonstrates Aatrial pacing with intrinsic conduction and left bundle branch block   Assessment and  Plan

## 2012-08-05 NOTE — Assessment & Plan Note (Signed)
The patient's device was interrogated.  The information was reviewed. No changes were made in the programming.    

## 2012-08-05 NOTE — Patient Instructions (Addendum)
Your physician has recommended you make the following change in your medication: 1) Take amiodarone 200 mg 1/2 tablet daily four times a week.  Your physician recommends that you have lab work today: liver/tsh  Your physician wants you to follow-up in: 6 months with the device clinic & 1 year with Dr. Graciela Husbands. You will receive a reminder letter in the mail two months in advance. If you don't receive a letter, please call our office to schedule the follow-up appointment.

## 2012-08-05 NOTE — Assessment & Plan Note (Signed)
No evidence of atrial fibrillation  We  discussed Coumadin versus NOACs and risks of bleeding. At this point the family would like to continue on Coumadin;  We will decrease the amiodarone to 100 mg daily. Surveillance laboratories are in order. TSH was mildly elevated in 2009 and I don't see a repeat.

## 2012-08-06 ENCOUNTER — Encounter: Payer: Medicare Other | Admitting: Internal Medicine

## 2012-08-12 ENCOUNTER — Encounter: Payer: Self-pay | Admitting: Internal Medicine

## 2012-08-28 ENCOUNTER — Encounter: Payer: Self-pay | Admitting: *Deleted

## 2012-09-01 ENCOUNTER — Telehealth: Payer: Self-pay | Admitting: Internal Medicine

## 2012-09-01 NOTE — Telephone Encounter (Signed)
Spoke with monica, pt was referred to dr byrum at [facility] pulmonary.

## 2012-09-01 NOTE — Telephone Encounter (Signed)
New Problem:    Called in wanting to know what the name of the pulmonologist the patient was referred to was.  Please call back.

## 2012-09-02 ENCOUNTER — Ambulatory Visit: Payer: Medicare Other | Admitting: Internal Medicine

## 2012-09-02 ENCOUNTER — Telehealth: Payer: Self-pay | Admitting: Emergency Medicine

## 2012-09-02 NOTE — Telephone Encounter (Signed)
I spoke to Dr. Kendrick Fries in regards to this. He states that Dr. Marikay Alar has not contacted him in any way about this pt. Per BQ - pt needs to keep her appointment with RB on 09/04/12, it's only 2 days from now.

## 2012-09-02 NOTE — Telephone Encounter (Signed)
Called, spoke with [facility] who states pt's daughter has been taking care of pt and they have been living in [place].  Per [place], pt is having problems with her breathing, was seen years ago by Dr. Delton Coombes, and daughter has brought pt back here to see if we can help her breathing.  Maxine Glenn states daughter is afraid pt is getting pna again.  Per Maxine Glenn, they were advised they Dr. Odessa Fleming office to call to see if pt can be worked in at [employer] office today by BQ.  Maxine Glenn is also requesting I call pt's daughter, Rod Holler, at [phone] to discuss further with her.    I have lmomtcb for [place].  In the meantime, will send msg to BQ to see if pt can be worked in today in [place].  She does have a pending Consult with RB on Thursday but would like to be seen sooner by BQ.  Pls advise.  Thank you.

## 2012-09-02 NOTE — Telephone Encounter (Signed)
Tweed returning call.  [phone]

## 2012-09-02 NOTE — Telephone Encounter (Signed)
lmomtcb for Tweed to inform her of below per BQ.

## 2012-09-03 NOTE — Telephone Encounter (Signed)
I spoke with the pt daughter and she states that they are having a CT scan today and will follow-up with Dr. Graciela Husbands. They want to cancel appt with Dr. Delton Coombes for tomorrow. This has been done. Carron Curie, CMA

## 2012-09-04 ENCOUNTER — Institutional Professional Consult (permissible substitution): Payer: Medicare Other | Admitting: Emergency Medicine

## 2012-10-16 ENCOUNTER — Telehealth: Payer: Self-pay | Admitting: Internal Medicine

## 2012-10-16 NOTE — Telephone Encounter (Signed)
New prob  Maxine Glenn would like to speak with your regarding this patient. She did not say what it was concerning but someone in the background states that it was rather important.

## 2012-10-16 NOTE — Telephone Encounter (Signed)
Spoke with monica, the pt has been in hospice but has gotten better and she is going to be taken out of hospice. They want to know if dr Graciela Husbands will oversee the p[facility] once [facility]. She will need PT, OT and speech eval for swallowing. The pt also has oxygen and will need someone to oversee her blood work and PT/INR's. They want o know if dr Graciela Husbands will sign and give those orders to [facility]. The pt does not have a primary care md. Will forward for dr Graciela Husbands review.

## 2012-10-17 ENCOUNTER — Ambulatory Visit (INDEPENDENT_AMBULATORY_CARE_PROVIDER_SITE_OTHER): Payer: Medicare Other | Admitting: Pharmacist

## 2012-10-17 DIAGNOSIS — Z7901 Long term (current) use of anticoagulants: Secondary | ICD-10-CM

## 2012-10-17 DIAGNOSIS — I4891 Unspecified atrial fibrillation: Secondary | ICD-10-CM

## 2012-10-17 LAB — POCT INR: INR: 1.7

## 2012-10-22 ENCOUNTER — Encounter: Payer: Self-pay | Admitting: Internal Medicine

## 2012-10-22 NOTE — Telephone Encounter (Signed)
Spoke with monica, serving as the intermediary with Dawn Barrett, pts daughter i hve agreed that we can follow her INR and will ask Weston Brass to call Maxine Glenn to establish the contact

## 2012-10-22 NOTE — Telephone Encounter (Signed)
Dawn Barrett putt pharm md spoke with dr Graciela Husbands regarding this pt and who was going to follow her. Per dr Graciela Husbands he will call and talk to the family. Will f/u with dr Graciela Husbands about conversation with family.

## 2012-10-23 DIAGNOSIS — Z7901 Long term (current) use of anticoagulants: Secondary | ICD-10-CM | POA: Insufficient documentation

## 2012-10-24 ENCOUNTER — Ambulatory Visit (INDEPENDENT_AMBULATORY_CARE_PROVIDER_SITE_OTHER): Payer: Medicare Other | Admitting: Pharmacist

## 2012-10-24 DIAGNOSIS — I4891 Unspecified atrial fibrillation: Secondary | ICD-10-CM

## 2012-10-24 DIAGNOSIS — Z7901 Long term (current) use of anticoagulants: Secondary | ICD-10-CM

## 2012-10-24 LAB — POCT INR: INR: 1.4

## 2012-10-27 DIAGNOSIS — I214 Non-ST elevation (NSTEMI) myocardial infarction: Secondary | ICD-10-CM

## 2012-10-27 HISTORY — DX: Non-ST elevation (NSTEMI) myocardial infarction: I21.4

## 2012-10-28 NOTE — Telephone Encounter (Signed)
Spoke with Maxine Glenn - she states that they spoke with Dr. Graciela Husbands and situation has been handled.

## 2012-10-28 NOTE — Telephone Encounter (Signed)
New problem   [facility] is needing verbal order for to treat for speech therapy. Please call Toniann Fail

## 2012-10-31 ENCOUNTER — Ambulatory Visit (INDEPENDENT_AMBULATORY_CARE_PROVIDER_SITE_OTHER): Payer: Medicare Other | Admitting: Cardiology

## 2012-10-31 DIAGNOSIS — I4891 Unspecified atrial fibrillation: Secondary | ICD-10-CM

## 2012-10-31 DIAGNOSIS — Z7901 Long term (current) use of anticoagulants: Secondary | ICD-10-CM

## 2012-10-31 LAB — POCT INR: INR: 2.4

## 2012-11-04 ENCOUNTER — Telehealth: Payer: Self-pay | Admitting: *Deleted

## 2012-11-04 NOTE — Telephone Encounter (Signed)
Referred patient to Primary care at [facility] to establish a PCP

## 2012-11-07 ENCOUNTER — Ambulatory Visit (INDEPENDENT_AMBULATORY_CARE_PROVIDER_SITE_OTHER): Payer: Medicare Other | Admitting: Cardiology

## 2012-11-07 DIAGNOSIS — Z7901 Long term (current) use of anticoagulants: Secondary | ICD-10-CM

## 2012-11-07 DIAGNOSIS — I4891 Unspecified atrial fibrillation: Secondary | ICD-10-CM

## 2012-11-07 LAB — POCT INR: INR: 1.9

## 2012-11-13 ENCOUNTER — Other Ambulatory Visit: Payer: Self-pay | Admitting: *Deleted

## 2012-11-13 DIAGNOSIS — I4891 Unspecified atrial fibrillation: Secondary | ICD-10-CM

## 2012-11-14 ENCOUNTER — Inpatient Hospital Stay (HOSPITAL_COMMUNITY)
Admission: EM | Admit: 2012-11-14 | Discharge: 2012-11-21 | DRG: 280 | Disposition: A | Discharge status: Home Health | Payer: Medicare Other | Attending: Internal Medicine | Admitting: Internal Medicine

## 2012-11-14 ENCOUNTER — Ambulatory Visit (INDEPENDENT_AMBULATORY_CARE_PROVIDER_SITE_OTHER): Payer: Medicare Other | Admitting: Pharmacist

## 2012-11-14 ENCOUNTER — Emergency Department (HOSPITAL_COMMUNITY): Payer: Medicare Other

## 2012-11-14 ENCOUNTER — Encounter (HOSPITAL_COMMUNITY): Payer: Self-pay | Admitting: Neurology

## 2012-11-14 DIAGNOSIS — B961 Klebsiella pneumoniae [K. pneumoniae] as the cause of diseases classified elsewhere: Secondary | ICD-10-CM | POA: Diagnosis present

## 2012-11-14 DIAGNOSIS — I480 Paroxysmal atrial fibrillation: Secondary | ICD-10-CM

## 2012-11-14 DIAGNOSIS — I5041 Acute combined systolic (congestive) and diastolic (congestive) heart failure: Secondary | ICD-10-CM | POA: Diagnosis present

## 2012-11-14 DIAGNOSIS — E876 Hypokalemia: Secondary | ICD-10-CM | POA: Diagnosis not present

## 2012-11-14 DIAGNOSIS — N390 Urinary tract infection, site not specified: Secondary | ICD-10-CM | POA: Diagnosis present

## 2012-11-14 DIAGNOSIS — I509 Heart failure, unspecified: Secondary | ICD-10-CM

## 2012-11-14 DIAGNOSIS — Z8673 Personal history of transient ischemic attack (TIA), and cerebral infarction without residual deficits: Secondary | ICD-10-CM

## 2012-11-14 DIAGNOSIS — Z7901 Long term (current) use of anticoagulants: Secondary | ICD-10-CM

## 2012-11-14 DIAGNOSIS — Z9981 Dependence on supplemental oxygen: Secondary | ICD-10-CM

## 2012-11-14 DIAGNOSIS — J4489 Other specified chronic obstructive pulmonary disease: Secondary | ICD-10-CM | POA: Diagnosis present

## 2012-11-14 DIAGNOSIS — I421 Obstructive hypertrophic cardiomyopathy: Secondary | ICD-10-CM | POA: Diagnosis present

## 2012-11-14 DIAGNOSIS — J189 Pneumonia, unspecified organism: Secondary | ICD-10-CM

## 2012-11-14 DIAGNOSIS — I447 Left bundle-branch block, unspecified: Secondary | ICD-10-CM | POA: Diagnosis present

## 2012-11-14 DIAGNOSIS — I059 Rheumatic mitral valve disease, unspecified: Secondary | ICD-10-CM

## 2012-11-14 DIAGNOSIS — I10 Essential (primary) hypertension: Secondary | ICD-10-CM | POA: Diagnosis present

## 2012-11-14 DIAGNOSIS — I2510 Atherosclerotic heart disease of native coronary artery without angina pectoris: Secondary | ICD-10-CM | POA: Diagnosis present

## 2012-11-14 DIAGNOSIS — I959 Hypotension, unspecified: Secondary | ICD-10-CM | POA: Diagnosis present

## 2012-11-14 DIAGNOSIS — J449 Chronic obstructive pulmonary disease, unspecified: Secondary | ICD-10-CM | POA: Diagnosis present

## 2012-11-14 DIAGNOSIS — I214 Non-ST elevation (NSTEMI) myocardial infarction: Principal | ICD-10-CM | POA: Diagnosis present

## 2012-11-14 DIAGNOSIS — I4891 Unspecified atrial fibrillation: Secondary | ICD-10-CM | POA: Diagnosis present

## 2012-11-14 DIAGNOSIS — Z950 Presence of cardiac pacemaker: Secondary | ICD-10-CM | POA: Diagnosis present

## 2012-11-14 DIAGNOSIS — Z79899 Other long term (current) drug therapy: Secondary | ICD-10-CM

## 2012-11-14 DIAGNOSIS — R0602 Shortness of breath: Secondary | ICD-10-CM

## 2012-11-14 DIAGNOSIS — R0902 Hypoxemia: Secondary | ICD-10-CM | POA: Diagnosis present

## 2012-11-14 HISTORY — DX: Chronic obstructive pulmonary disease, unspecified: J44.9

## 2012-11-14 HISTORY — DX: Paroxysmal atrial fibrillation: I48.0

## 2012-11-14 LAB — URINALYSIS, ROUTINE W REFLEX MICROSCOPIC
Bilirubin Urine: NEGATIVE
Ketones, ur: NEGATIVE mg/dL
Nitrite: POSITIVE — AB
Specific Gravity, Urine: 1.013 (ref 1.005–1.030)
Urobilinogen, UA: 2 mg/dL — ABNORMAL HIGH (ref 0.0–1.0)

## 2012-11-14 LAB — CBC WITH DIFFERENTIAL/PLATELET
Basophils Absolute: 0.1 10*3/uL (ref 0.0–0.1)
Basophils Relative: 0 % (ref 0–1)
MCHC: 32.6 g/dL (ref 30.0–36.0)
Monocytes Absolute: 1 10*3/uL (ref 0.1–1.0)
Neutro Abs: 6.6 10*3/uL (ref 1.7–7.7)
Neutrophils Relative %: 53 % (ref 43–77)
RDW: 15.6 % — ABNORMAL HIGH (ref 11.5–15.5)

## 2012-11-14 LAB — COMPREHENSIVE METABOLIC PANEL
ALT: 19 U/L (ref 0–35)
Alkaline Phosphatase: 266 U/L — ABNORMAL HIGH (ref 39–117)
BUN: 16 mg/dL (ref 6–23)
CO2: 25 mEq/L (ref 19–32)
Calcium: 8.5 mg/dL (ref 8.4–10.5)
GFR calc Af Amer: >90 mL/min (ref >90)
GFR calc non Af Amer: 83 mL/min — ABNORMAL LOW (ref >90)
Glucose, Bld: 179 mg/dL — ABNORMAL HIGH (ref 70–99)
Sodium: 132 mEq/L — ABNORMAL LOW (ref 135–145)
Total Protein: 7.4 g/dL (ref 6.0–8.3)

## 2012-11-14 LAB — POCT I-STAT TROPONIN I

## 2012-11-14 LAB — URINE MICROSCOPIC-ADD ON

## 2012-11-14 LAB — TROPONIN I: Troponin I: 0.97 ng/mL (ref <0.30)

## 2012-11-14 LAB — POCT INR: INR: 2.1

## 2012-11-14 MED ORDER — SODIUM CHLORIDE 0.9 % IV SOLN: 250.0000 mL | INTRAVENOUS | Status: DC | PRN

## 2012-11-14 MED ORDER — SODIUM CHLORIDE 0.9 % IJ SOLN
3.0000 mL | Freq: Two times a day (BID) | INTRAMUSCULAR | Status: DC
  Administered 2012-11-14 – 2012-11-18 (×8): 3 mL via INTRAVENOUS
  Administered 2012-11-19: 11:00:00 via INTRAVENOUS
  Administered 2012-11-19 – 2012-11-21 (×4): 3 mL via INTRAVENOUS

## 2012-11-14 MED ORDER — WARFARIN - PHARMACIST DOSING INPATIENT
Freq: Every day | Status: DC
  Administered 2012-11-17 – 2012-11-20 (×4)

## 2012-11-14 MED ORDER — ACETAMINOPHEN 325 MG PO TABS: 650.0000 mg | ORAL_TABLET | ORAL | Status: DC | PRN

## 2012-11-14 MED ORDER — METHYLPREDNISOLONE SODIUM SUCC 125 MG IJ SOLR
80.0000 mg | Freq: Once | INTRAMUSCULAR | Status: AC
  Administered 2012-11-14: 80 mg via INTRAVENOUS

## 2012-11-14 MED ORDER — ALPRAZOLAM 0.25 MG PO TABS: 0.2500 mg | ORAL_TABLET | Freq: Two times a day (BID) | ORAL | Status: DC | PRN

## 2012-11-14 MED ORDER — NITROGLYCERIN 2 % TD OINT
1.0000 [in_us] | TOPICAL_OINTMENT | Freq: Once | TRANSDERMAL | Status: AC
  Administered 2012-11-14: 1 [in_us] via TOPICAL
  Filled 2012-11-14: qty 1

## 2012-11-14 MED ORDER — FUROSEMIDE 10 MG/ML IJ SOLN
80.0000 mg | Freq: Two times a day (BID) | INTRAMUSCULAR | Status: DC
  Filled 2012-11-14 (×3): qty 8

## 2012-11-14 MED ORDER — NITROGLYCERIN 0.4 MG SL SUBL: 0.4000 mg | SUBLINGUAL_TABLET | SUBLINGUAL | Status: DC | PRN

## 2012-11-14 MED ORDER — FUROSEMIDE 10 MG/ML IJ SOLN
40.0000 mg | Freq: Once | INTRAMUSCULAR | Status: DC
  Filled 2012-11-14: qty 4

## 2012-11-14 MED ORDER — FUROSEMIDE 10 MG/ML IJ SOLN
40.0000 mg | Freq: Once | INTRAMUSCULAR | Status: AC
  Administered 2012-11-14: 40 mg via INTRAVENOUS
  Filled 2012-11-14: qty 4

## 2012-11-14 MED ORDER — CIPROFLOXACIN HCL 250 MG PO TABS
250.0000 mg | ORAL_TABLET | Freq: Two times a day (BID) | ORAL | Status: DC
  Administered 2012-11-14 – 2012-11-21 (×14): 250 mg via ORAL
  Filled 2012-11-14 (×17): qty 1

## 2012-11-14 MED ORDER — WARFARIN SODIUM 1 MG PO TABS
1.0000 mg | ORAL_TABLET | Freq: Once | ORAL | Status: AC
  Administered 2012-11-14: 1 mg via ORAL
  Filled 2012-11-14: qty 1

## 2012-11-14 MED ORDER — ONDANSETRON HCL 4 MG/2ML IJ SOLN: 4.0000 mg | Freq: Four times a day (QID) | INTRAMUSCULAR | Status: DC | PRN

## 2012-11-14 MED ORDER — DEXTROSE 5 % IV SOLN
1.0000 g | Freq: Once | INTRAVENOUS | Status: AC
  Administered 2012-11-14: 1 g via INTRAVENOUS
  Filled 2012-11-14: qty 10

## 2012-11-14 MED ORDER — ALBUTEROL (5 MG/ML) CONTINUOUS INHALATION SOLN
10.0000 mg/h | INHALATION_SOLUTION | Freq: Once | RESPIRATORY_TRACT | Status: AC
  Administered 2012-11-14: 10 mg/h via RESPIRATORY_TRACT
  Filled 2012-11-14: qty 20

## 2012-11-14 MED ORDER — SODIUM CHLORIDE 0.9 % IJ SOLN: 3.0000 mL | INTRAMUSCULAR | Status: DC | PRN

## 2012-11-14 MED ORDER — METHYLPREDNISOLONE SODIUM SUCC 125 MG IJ SOLR
125.0000 mg | Freq: Once | INTRAMUSCULAR | Status: DC
  Filled 2012-11-14: qty 2

## 2012-11-14 MED ORDER — ZOLPIDEM TARTRATE 5 MG PO TABS
5.0000 mg | ORAL_TABLET | Freq: Every evening | ORAL | Status: DC | PRN
  Filled 2012-11-14: qty 1

## 2012-11-14 NOTE — Progress Notes (Signed)
CRITICAL VALUE ALERT  Critical value received:  Troponin 0.97  Date of notification: 11/14/12  Time of notification:  2143  Critical value read back:yes  Nurse who received alert:  Exie Parody RN  MD notified (1st page):  [facility] MD  Time of first page:  2134  Responding MD:  Dr. Adolm Joseph   Time MD responded:  2136

## 2012-11-14 NOTE — ED Notes (Signed)
Troponin results shown to tech and nurse

## 2012-11-14 NOTE — Progress Notes (Signed)
Echocardiogram 2D Echocardiogram  Stat Exam has been performed.  Dawn Barrett 11/14/2012, 2:01 PM

## 2012-11-14 NOTE — H&P (Signed)
CARDIOLOGY HISTORY AND PHYSICAL    Patient ID: Dawn Barrett MRN: [id number] DOB/AGE: 1918/05/11 79 y.o.  Admit date: 11/14/2012  Primary Physician   Default, Provider, MD Primary Cardiologist   SK Reason for Consultation   Abnormal ECG, elevated troponin   HPI: Dawn Barrett is a 79 y/o WF w/ h/o HTN, PAF (on coumadin), stroke, COPD, and no known h/o CAD.  She has an implanted pacemaker, and sees Dr. Graciela Husbands.   Dawn Barrett is non-verbal and unable to provide any historical information, but her caretakers are at the bedside and her daughter is en route to the ED.  Pt. Is on coumadin and amiodarone for PAF.   She reports to the ED this morning with severe SOB and respiratory distress.  Her caregivers report that this morning, around 8AM, Dawn Barrett had just finished her routine albuterol breathing treatment when she suddenly became dyspneic and tachypneic.  Her O2 sat dropped into the high 80s and her HR was 118.  She was breathing very quickly, wheezing and using accessory muscles.  Dawn Barrett normally uses 2.5L of O2 nasal canula at home, but it was increased to 4L with her respiratory distress.  It seemed to help some. She was also holding her chest, but was unable to communicate if there was any pain in her chest. There was no cough, vomiting, diaphoresis, fever or chills noted. Her caregivers also deny any active bleeding, bloody stools, bloody urine, or hemoptysis. Her caregivers reports that a few weeks ago she LE edema, R>L, but it has improved.  Per EMS, patient was hypertensive with SBP in the 180s and had bilateral wheezing.   In the ED, O2 sats are steady at 94 on 4L nasal cannula, range from 90-97% . HR 91-107, SBP 97-159, DBP 38-80.  Last BP 116/39. Initial troponin (+) at 0.12, ECG sinus tach w/ old LBBB and nonspecific ST changes in anterior leads. Urine (+) for nitrite, large leukocytes, many bacteria, and trace Hgb. WBC 12.4, BNP 3007.0, INR 2.14.   Past Medical History   Diagnosis Date  . Stroke   . Hypertension   . COPD (chronic obstructive pulmonary disease)   . Paroxysmal atrial fibrillation      Past Surgical History  Procedure Laterality Date  . Pacemaker insertion  11/19/2007    St. Jude Zephyr 5020 pulse generator, serial [id number]  . Femur surgery Right 06/2011    Rod inserted after a fall.     Allergies  Allergen Reactions  . Codeine Other (See Comments)    REACTION: unknown  . Sulfonamide Derivatives Other (See Comments)    REACTION:  unknown   I have reviewed the patient's current medications  Prior to Admission medications   Medication Sig Start Date End Date Taking? Authorizing Provider  albuterol (ACCUNEB) 1.25 MG/3ML nebulizer solution Take 1 ampule by nebulization every 6 (six) hours as needed for wheezing.   Yes Historical Provider, MD  amiodarone (PACERONE) 200 MG tablet Take 200 mg by mouth daily.   Yes Historical Provider, MD  nystatin (MYCOSTATIN/NYSTOP) 100000 UNIT/GM POWD Apply 1 g topically daily as needed (to affected area).   Yes Historical Provider, MD  valsartan (DIOVAN) 40 MG tablet Take 40 mg by mouth daily.   Yes Historical Provider, MD  warfarin (COUMADIN) 2 MG tablet Take 2 mg by mouth daily.   Yes Historical Provider, MD     History   Social History  . Marital Status: Widowed    Spouse Name: N/A  Number of Children: N/A  . Years of Education: N/A   Occupational History  . Retired    Social History Main Topics  . Smoking status: Never Smoker   . Smokeless tobacco: Not on file  . Alcohol Use: No  . Drug Use: No  . Sexual Activity: Not on file   Other Topics Concern  . Not on file   Social History Narrative   Pt lives with daughter in [place] but has been in [place] recently.    Family Status  Relation Status Death Age  . Mother Deceased 66s    Died of an MI  . Father Deceased    ROS:  Full 14 point review of systems complete and found to be negative unless listed  above.  Physical Exam: Blood pressure 104/50, pulse 91, temperature 99.1 F (37.3 C), temperature source Rectal, resp. rate 21, SpO2 94.00%.  General: Well developed, thin, WF in mild respiratory distress Head: Normocephalic and atraumatic Lungs: Anterior bilateral wheezing, basilar rales Heart: HRRR S1 S2, rub noted, no gallop, possible murmur. pulses are 2+ all 4 extrem.   Neck: No carotid bruits.  JVD elevation 9 cm. Abdomen: Bowel sounds present, abdomen soft and non-tender without masses or hernias noted. Extremities: No clubbing or cyanosis. No edema.  Neuro: Lethargic, but responds to stimulation Psych:  Pt is nonverbal.  Can nod head yes and no.  Skin: bilateral LEs erythematous and healing scabs.   Labs:  Lab Results  Component Value Date   WBC 12.4* 11/14/2012   HGB 12.5 11/14/2012   HCT 38.4 11/14/2012   MCV 98.5 11/14/2012   PLT 159 11/14/2012    Recent Labs  11/14/12 0921  INR 2.14*     Recent Labs Lab 11/14/12 0921  NA 132*  K 4.0  CL 98  CO2 25  BUN 16  CREATININE 0.47*  CALCIUM 8.5  PROT 7.4  BILITOT 0.6  ALKPHOS 266*  ALT 19  AST 32  GLUCOSE 179*    Recent Labs  11/14/12 0913  TROPIPOC 0.12*   Pro B Natriuretic peptide (BNP)  Date/Time Value Range Status  11/14/2012  9:28 AM 3007.0* 0 - 450 pg/mL Final   TSH  Date/Time Value Range Status  08/05/2012 10:09 AM 5.72* 0.35 - 5.50 uIU/mL Final   Urinalysis    Component Value Date/Time   COLORURINE YELLOW 11/14/2012 0950   APPEARANCEUR CLOUDY* 11/14/2012 0950   LABSPEC 1.013 11/14/2012 0950   PHURINE 6.0 11/14/2012 0950   GLUCOSEU NEGATIVE 11/14/2012 0950   HGBUR TRACE* 11/14/2012 0950   BILIRUBINUR NEGATIVE 11/14/2012 0950   KETONESUR NEGATIVE 11/14/2012 0950   PROTEINUR NEGATIVE 11/14/2012 0950   UROBILINOGEN 2.0* 11/14/2012 0950   NITRITE POSITIVE* 11/14/2012 0950   LEUKOCYTESUR LARGE* 11/14/2012 0950   Echo: 11/14/2012  Study Conclusions - Left ventricle: The cavity size was normal. Wall  thickness was increased in a pattern of mild LVH. There was moderate focal basal hypertrophy of the septum. Systolic function was mildly to moderately reduced. The estimated ejection fraction was in the range of 40% to 45%. There is akinesis of the apical myocardium. Doppler parameters are consistent with abnormal left ventricular relaxation (grade 1 diastolic dysfunction). Doppler parameters are consistent with high ventricular filling pressure. - Mitral valve: Calcified annulus. There was moderate systolic anterior motion of the anterior leaflet, posterior leaflet, and chordal structures. Moderate regurgitation. - Left atrium: The atrium was mildly dilated. - Tricuspid valve: Mild-moderate regurgitation. - Pulmonary arteries: Systolic pressure was moderately increased.  PA peak pressure: 61mm Hg (S). - Pericardium, extracardiac: A trivial pericardial effusion was identified. Impressions: - Apical akinesis with hyperdynamic basal function; overall EF 40-45; trivial pericardial effusion; mitral SAM with moderate MR; LVOT gradient not well assessed but at least 2.4 m/s; findings consistent with HOCM.  2009 SUMMARY - The left ventricle was small. Overall left ventricular systolic function was vigorous. Left ventricular ejection fraction was estimated , range being 65 % to 70 %. There was no diagnostic evidence of left ventricular regional wall motion abnormalities. Left ventricular wall thickness was mildly increased. There was moderate asymmetric septal hypertrophy. There was paradoxical motion of the interventricular septum, consistent with a conduction abnormality or paced rhythm. - The aortic valve was mildly calcified. - There was moderate fibrocalcific change of the aortic root. - There was moderate mitral annular calcification. - There was a left pleural effusion.  ECG: 11/14/2012: Sinus tachycardia, LBBB (unchanged from previous), non-specific ST changes in anterior  leads  Radiology:  Dg Chest Portable 1 View 11/14/2012   CLINICAL DATA:  Shortness of Breath. Congestion. Pneumonia.  EXAM: PORTABLE CHEST - 1 VIEW  COMPARISON:  07/01/2011.  FINDINGS: Sequential pacemaker is in place entering from the left. Full extent of the leads not included. The portions which are visualized do not demonstrate gross interruption. There is narrowing of a proximal lead adjacent the battery pack. Tips appear to be in the region of the right atrium and right ventricle.  Marked cardiomegaly.  Diffuse asymmetric airspace disease with bilateral pleural effusions may represent pulmonary edema. Infectious infiltrate cannot be excluded in the proper clinical setting. Additionally, underlying mass could not be excluded given the pleural effusions and mediastinal prominence.  No gross pneumothorax.  Remote right-sided rib fractures.  IMPRESSION: Marked cardiomegaly with sequential pacemaker in place.  Asymmetric airspace disease with bilateral pleural effusions may represent pulmonary edema although infectious infiltrate cannot be excluded in proper clinical setting.  Follow-up in the clearance recommended.   Electronically Signed   By: Bridgett Larsson   On: 11/14/2012 09:12    ASSESSMENT AND PLAN:  Hypoxia - continue nebs but needs diuresis; not much UOP after initial Lasix 40 mg. Repeat and follow I./O as well as renal function. See echo report of, motion abnormality noted but no reports of chest pain and initial enzymes are minimally elevated  Abnormal urinalysis: Check culture, start empiric antibiotics. She has a history of UTI  Left ventricular dysfunction, HOCM with SAM - M.D. repeat echo and advise; may be ischemic given wall motion abnormality  History of pacemaker, St. Jude - interrogate device but currently her rhythm appears to be entirely intrinsic  Otherwise, continue home medications  Signed: Patriciaann Clan 11/14/2012 11:23 AM  Theodore Demark,  PA-C 11/14/2012 4:35 PM Beeper [phone]  Co-Sign MD  The patient was seen, examined and discussed with Theodore Demark, PA-C and I agree with the above.    Dawn Barrett is a 79 y/o WF w/ h/o HTN, PAF (on coumadin), stroke (now non-verbal, bed ridden), COPD, and no known h/o CAD.  She has an implanted pacemaker, and sees Dr. Graciela Husbands in our clinic.  She came to the ED this morning with severe SOB and respiratory distress with low O2 sats (81%) at the initial presentation. Improved quickly after respiratory treatment. Minimal troponin elevation with no ECG changes.  Clinically she is showing signs of acute heart failure. Echocardiogram shows akinesis of the apical segments and hyperdynamic base with SAM. We consider Takotsubo cardiomyopathy in the differential.  Betablocker will be added to her regimen one her CHF improves.  We will start iv diuretics .    Tobias Alexander, Rexene Edison 11/14/2012

## 2012-11-14 NOTE — ED Notes (Addendum)
Notified Horton, MD of abnormal lab test Troponin result of 0.12 per [place], EMT

## 2012-11-14 NOTE — ED Provider Notes (Signed)
CSN: [id number]     Arrival date & time 11/14/12  0845 History   First MD Initiated Contact with Patient 11/14/12 0848     Chief Complaint  Patient presents with  . Shortness of Breath   (Consider location/radiation/quality/duration/timing/severity/associated sxs/prior Treatment) HPI This is a 79 -year-old female with a history of CVA, hypertension, and atrial fibrillation currently on Coumadin who presents with shortness of breath. The patient is aphakic and unable to contribute to history taking. Her caregivers are at the bedside. They report the patient had acute drop in her oxygen saturations this morning into the 80s. He placed her on supplemental oxygen and gave her a breathing treatment. Per the patient's daughter, the patient was admitted to the hospital in [place] and has been battling "bronchitis". She's been receiving albuterol treatments at home. She has no history of heart failure and is not on any current diuretics. Per EMS, the patient was noted to be hypertensive. She had bilateral wheezing and was tight. She was placed on a DuoNeb. Past Medical History  Diagnosis Date  . Stroke   . Hypertension   . COPD (chronic obstructive pulmonary disease)   . Paroxysmal atrial fibrillation    Past Surgical History  Procedure Laterality Date  . Pacemaker insertion  11/19/2007    St. Jude Zephyr 5020 pulse generator, serial [id number]  . Femur surgery Right 06/2011    Rod inserted after a fall.    No family history on file. History  Substance Use Topics  . Smoking status: Never Smoker   . Smokeless tobacco: Not on file  . Alcohol Use: No   OB History   Grav Para Term Preterm Abortions TAB SAB Ect Mult Living                 Review of Systems  Unable to perform ROS: Patient nonverbal    Allergies  Codeine and Sulfonamide derivatives  Home Medications   No current outpatient prescriptions on file. BP 110/41  Pulse 96  Temp(Src) 99.1 F (37.3 C) (Rectal)  Resp 25  SpO2  94% Physical Exam  Nursing note and vitals reviewed. Constitutional: She appears well-developed and well-nourished.  Tachypnea  HENT:  Head: Normocephalic and atraumatic.  Eyes: Pupils are equal, round, and reactive to light.  Neck: Neck supple. No JVD present.  Cardiovascular: Normal rate, regular rhythm and normal heart sounds.   No murmur heard. Pulmonary/Chest: She is in respiratory distress. She has wheezes.  Tachypnea noted, poor air movement bilaterally with expiratory wheezing and crackles at the bases  Abdominal: Soft. Bowel sounds are normal. There is no tenderness. There is no rebound.  Musculoskeletal:  Trace to 1+ bilateral lower extremity edema  Neurological:  Nods her head yes or no to questions, moves all 4 extremities  Skin: Skin is warm and dry.  Psychiatric: She has a normal mood and affect.    ED Course  Procedures (including critical care time) Labs Review Labs Reviewed  CBC WITH DIFFERENTIAL - Abnormal; Notable for the following:    WBC 12.4 (*)    RDW 15.6 (*)    Lymphs Abs 4.7 (*)    All other components within normal limits  COMPREHENSIVE METABOLIC PANEL - Abnormal; Notable for the following:    Sodium 132 (*)    Glucose, Bld 179 (*)    Creatinine, Ser 0.47 (*)    Albumin 2.2 (*)    Alkaline Phosphatase 266 (*)    GFR calc non Af Amer 83 (*)  All other components within normal limits  URINALYSIS, ROUTINE W REFLEX MICROSCOPIC - Abnormal; Notable for the following:    APPearance CLOUDY (*)    Hgb urine dipstick TRACE (*)    Urobilinogen, UA 2.0 (*)    Nitrite POSITIVE (*)    Leukocytes, UA LARGE (*)    All other components within normal limits  PRO B NATRIURETIC PEPTIDE - Abnormal; Notable for the following:    Pro B Natriuretic peptide (BNP) 3007.0 (*)    All other components within normal limits  PROTIME-INR - Abnormal; Notable for the following:    Prothrombin Time 23.2 (*)    INR 2.14 (*)    All other components within normal limits   URINE MICROSCOPIC-ADD ON - Abnormal; Notable for the following:    Bacteria, UA MANY (*)    All other components within normal limits  POCT I-STAT TROPONIN I - Abnormal; Notable for the following:    Troponin i, poc 0.12 (*)    All other components within normal limits  URINE CULTURE   Imaging Review Dg Chest Bilateral Decubitus  11/14/2012   CLINICAL DATA:  Evaluate for pleural effusions. Shortness of breath.  EXAM: CHEST - BILATERAL DECUBITUS VIEW  COMPARISON:  11/14/2012 chest x-ray.  FINDINGS: Bilateral pleural effusions are noted to partially layered on decubitus views.  Findings suggestive of congestive heart failure. Sequential pacemaker is in place. Cardiomegaly. Tortuous aorta.  IMPRESSION: Bilateral pleural effusions are noted to partially layered on decubitus views.  Findings suggestive of congestive heart failure.  Sequential pacemaker is in place. Cardiomegaly.  Tortuous aorta.   Electronically Signed   By: Bridgett Larsson   On: 11/14/2012 16:57   Dg Chest Portable 1 View  11/14/2012   CLINICAL DATA:  Shortness of Breath. Congestion. Pneumonia.  EXAM: PORTABLE CHEST - 1 VIEW  COMPARISON:  07/01/2011.  FINDINGS: Sequential pacemaker is in place entering from the left. Full extent of the leads not included. The portions which are visualized do not demonstrate gross interruption. There is narrowing of a proximal lead adjacent the battery pack. Tips appear to be in the region of the right atrium and right ventricle.  Marked cardiomegaly.  Diffuse asymmetric airspace disease with bilateral pleural effusions may represent pulmonary edema. Infectious infiltrate cannot be excluded in the proper clinical setting. Additionally, underlying mass could not be excluded given the pleural effusions and mediastinal prominence.  No gross pneumothorax.  Remote right-sided rib fractures.  IMPRESSION: Marked cardiomegaly with sequential pacemaker in place.  Asymmetric airspace disease with bilateral pleural  effusions may represent pulmonary edema although infectious infiltrate cannot be excluded in proper clinical setting.  Follow-up in the clearance recommended.   Electronically Signed   By: Bridgett Larsson   On: 11/14/2012 09:12   EKG independently reviewed by myself: Left bundle-branch block with a rate of 112, ST elevation noted in the anterior leads which is more prominent than prior but does not meet Scarbossa criteria  Bedside ultrasound performed by myself.  No wall motion abnormalities noted.  EF estimated 35-45%. MDM   1. Atrial fibrillation   2. Shortness of breath   3. Paroxysmal atrial fibrillation   4. Heart failure   5. Acute combined systolic and diastolic CHF, NYHA class 4    Pt presents with acute SOB.  Hypoxic on RA.  Wheezing and mid edema on exam.  Patient clinically appears in heart failure.  Given recent history of bronchitis and improvement with albuterol will also given neb and steroids to  see if any relief.  Elevated trop and BNP.  Nitro paste applied and patient given IV lasix.  Patient to be admitted by cardiology.    Shon Baton, MD 11/14/12 [phone]

## 2012-11-14 NOTE — ED Notes (Signed)
Family uncomfortable with lasix dose, due to pts low blood pressure

## 2012-11-14 NOTE — Progress Notes (Signed)
ANTICOAGULATION CONSULT NOTE - Initial Consult  Pharmacy Consult for Coumadin Indication: atrial fibrillation  Allergies  Allergen Reactions  . Codeine Other (See Comments)    REACTION: unknown  . Sulfonamide Derivatives Other (See Comments)    REACTION:  unknown    Patient Measurements:    Vital Signs: Temp: 99.1 F (37.3 C) (09/19 0952) Temp src: Rectal (09/19 0952) BP: 104/50 mmHg (09/19 1547) Pulse Rate: 91 (09/19 1547)  Labs:  Recent Labs  11/14/12 11/14/12 0921  HGB  --  12.5  HCT  --  38.4  PLT  --  159  LABPROT  --  23.2*  INR 2.1 2.14*  CREATININE  --  0.47*    The CrCl is unknown because both a height and weight (above a minimum accepted value) are required for this calculation.   Medical History: Past Medical History  Diagnosis Date  . Stroke   . Hypertension   . COPD (chronic obstructive pulmonary disease)   . Paroxysmal atrial fibrillation     Medications:  Coumadin 0.5mg  on Sundays and Thursdays, 1mg  all other days.  Last dose 9/18.  Assessment: 79 year old female admitted with hypoxia.  She is on chronic anticoagulation with Coumadin for atrial fibrillation and her INR is therapeutic.  Goal of Therapy:  INR 2-3   Plan:  Coumadin 1mg  today (her usual Friday dose) Daily PT/INR monitoring  Estella Husk, Pharm.D., BCPS, AAHIVP Clinical Pharmacist Phone [phone] or [phone] 11/14/2012, 4:47 PM

## 2012-11-14 NOTE — ED Notes (Signed)
Pt taken off neb treatment put on 2 L oxygen. Saturations at 88%, increased to 4 L.

## 2012-11-14 NOTE — ED Notes (Signed)
Per EMS- Pt comes from home where she had respiratory distress this morning, home health nurses gave her albuterol treatment, When EMS arrived her oxygen saturation was 90%. EMS gave her another albuterol treatment. 18 gauge to left forearm. BP 177/97, HR 92, 88% on neb treatment. Pt with labored breathing about 20/min, EKG showing LBBB.

## 2012-11-14 NOTE — ED Notes (Signed)
Pt undressed, in gown, on monitor, continuous pulse oximetry, blood pressure cuff and receiving a breathing treatment via arrival by [facility]

## 2012-11-14 NOTE — ED Notes (Signed)
Echo at bedside

## 2012-11-15 DIAGNOSIS — I4891 Unspecified atrial fibrillation: Secondary | ICD-10-CM

## 2012-11-15 DIAGNOSIS — I509 Heart failure, unspecified: Secondary | ICD-10-CM

## 2012-11-15 LAB — COMPREHENSIVE METABOLIC PANEL
AST: 27 U/L (ref 0–37)
Albumin: 2 g/dL — ABNORMAL LOW (ref 3.5–5.2)
CO2: 28 mEq/L (ref 19–32)
Calcium: 8.1 mg/dL — ABNORMAL LOW (ref 8.4–10.5)
Creatinine, Ser: 0.48 mg/dL — ABNORMAL LOW (ref 0.50–1.10)
GFR calc non Af Amer: 83 mL/min — ABNORMAL LOW (ref >90)

## 2012-11-15 LAB — TROPONIN I: Troponin I: 0.83 ng/mL (ref <0.30)

## 2012-11-15 LAB — PROTIME-INR: Prothrombin Time: 33.6 seconds — ABNORMAL HIGH (ref 11.6–15.2)

## 2012-11-15 MED ORDER — VITAMIN B-12 1000 MCG PO TABS
1000.0000 ug | ORAL_TABLET | Freq: Every day | ORAL | Status: DC
  Administered 2012-11-15 – 2012-11-21 (×7): 1000 ug via ORAL
  Filled 2012-11-15 (×8): qty 1

## 2012-11-15 MED ORDER — LOSARTAN POTASSIUM 50 MG PO TABS
50.0000 mg | ORAL_TABLET | Freq: Every day | ORAL | Status: DC
  Filled 2012-11-15: qty 1

## 2012-11-15 MED ORDER — LEVALBUTEROL HCL 1.25 MG/0.5ML IN NEBU: 1.2500 mg | INHALATION_SOLUTION | Freq: Four times a day (QID) | RESPIRATORY_TRACT | Status: DC

## 2012-11-15 MED ORDER — FUROSEMIDE 10 MG/ML IJ SOLN
40.0000 mg | Freq: Two times a day (BID) | INTRAMUSCULAR | Status: DC
  Administered 2012-11-15 – 2012-11-17 (×4): 40 mg via INTRAVENOUS
  Filled 2012-11-15 (×4): qty 4

## 2012-11-15 MED ORDER — FUROSEMIDE 10 MG/ML IJ SOLN: 40.0000 mg | Freq: Two times a day (BID) | INTRAMUSCULAR | Status: DC

## 2012-11-15 MED ORDER — AMIODARONE HCL 200 MG PO TABS
200.0000 mg | ORAL_TABLET | Freq: Every day | ORAL | Status: DC
  Filled 2012-11-15: qty 1

## 2012-11-15 MED ORDER — VITAMIN D3 25 MCG (1000 UNIT) PO TABS
2000.0000 [IU] | ORAL_TABLET | Freq: Every day | ORAL | Status: DC
  Administered 2012-11-15 – 2012-11-21 (×7): 2000 [IU] via ORAL
  Filled 2012-11-15 (×8): qty 2

## 2012-11-15 MED ORDER — INFLUENZA VAC SPLIT QUAD 0.5 ML IM SUSP: 0.5000 mL | INTRAMUSCULAR | Status: DC | PRN

## 2012-11-15 MED ORDER — PNEUMOCOCCAL VAC POLYVALENT 25 MCG/0.5ML IJ INJ
0.5000 mL | INJECTION | INTRAMUSCULAR | Status: AC
  Filled 2012-11-15: qty 0.5

## 2012-11-15 MED ORDER — LEVALBUTEROL HCL 1.25 MG/0.5ML IN NEBU
1.2500 mg | INHALATION_SOLUTION | Freq: Four times a day (QID) | RESPIRATORY_TRACT | Status: DC
  Administered 2012-11-15 – 2012-11-21 (×22): 1.25 mg via RESPIRATORY_TRACT
  Filled 2012-11-15 (×27): qty 0.5

## 2012-11-15 MED ORDER — ALBUTEROL SULFATE (5 MG/ML) 0.5% IN NEBU
1.2500 mg | INHALATION_SOLUTION | Freq: Four times a day (QID) | RESPIRATORY_TRACT | Status: DC | PRN
  Administered 2012-11-15: 2.5 mg via RESPIRATORY_TRACT
  Filled 2012-11-15: qty 0.5

## 2012-11-15 MED ORDER — VALSARTAN 40 MG PO TABS
20.0000 mg | ORAL_TABLET | Freq: Two times a day (BID) | ORAL | Status: DC
  Filled 2012-11-15 (×5): qty 0.5

## 2012-11-15 MED ORDER — VALSARTAN 40 MG PO TABS
20.0000 mg | ORAL_TABLET | Freq: Two times a day (BID) | ORAL | Status: DC
  Filled 2012-11-15: qty 0.5

## 2012-11-15 MED ORDER — VITAMIN C 500 MG PO TABS
500.0000 mg | ORAL_TABLET | Freq: Every day | ORAL | Status: DC
  Administered 2012-11-15 – 2012-11-21 (×7): 500 mg via ORAL
  Filled 2012-11-15 (×8): qty 1

## 2012-11-15 MED ORDER — HYDRALAZINE HCL 10 MG PO TABS
10.0000 mg | ORAL_TABLET | Freq: Three times a day (TID) | ORAL | Status: DC
  Filled 2012-11-15 (×4): qty 1

## 2012-11-15 NOTE — Progress Notes (Signed)
Patients daughter Dawn Barrett was very upset about patient not having physical therapy today and wants patient up to chair. Nurse and myself explained to her that physician had ordered PT but that they will be available tomorrow. Due to the patients decreased mobility and risk factors we felt it best to wait until their evaluation. She refused and stated that their hired help would come and assist her in getting the patient up to the chair and then back to bed afterwards. I explained that it was more a safety concern for the patient and she stated that she would accept full responsibility for her care and would not hold [place] liable if she should break something or fall. Expressed concerns about this and she insisted. Let her know that I would enter a note to show that we had spoke about the risks and concerns.

## 2012-11-15 NOTE — Progress Notes (Addendum)
Patient ID: SARALYNN LANGHORST, female   DOB: October 19, 1918, 79 y.o.   MRN: [id number]    Subjective:  Denies SSCP, palpitations or Dyspnea Daughter thinks she is doing well  Had recent stay at [facility] for respitory distress and is on home oxygen  Objective:  Filed Vitals:   11/15/12 0000 11/15/12 0400 11/15/12 0500 11/15/12 0800  BP: 163/73 100/48  170/88  Pulse: 84 63  81  Temp: 97.5 F (36.4 C) 97.7 F (36.5 C)  97.6 F (36.4 C)  TempSrc: Oral Oral  Oral  Resp: 32 16  20  Height:      Weight:   131 lb 9.8 oz (59.7 kg)   SpO2: 99% 98%  97%    Intake/Output from previous day:  Intake/Output Summary (Last 24 hours) at 11/15/12 0843 Last data filed at 11/15/12 0600  Gross per 24 hour  Intake    485 ml  Output    350 ml  Net    135 ml    Physical Exam: Affect appropriate Healthy:  appears stated age HEENT: normal Neck supple with no adenopathy JVP normal no bruits no thyromegaly Lungs clear with no wheezing and good diaphragmatic motion Heart:  S1/S2 no murmur, no rub, gallop or click PMI normal Abdomen: benighn, BS positve, no tenderness, no AAA no bruit.  No HSM or HJR Distal pulses intact with no bruits No edema Aphasic  Skin warm and dry No muscular weakness  Lab Results: Basic Metabolic Panel:  Recent Labs  46/96/29 0921 11/15/12 0200  NA 132* 136  K 4.0 3.7  CL 98 101  CO2 25 28  GLUCOSE 179* 157*  BUN 16 21  CREATININE 0.47* 0.48*  CALCIUM 8.5 8.1*   Liver Function Tests:  Recent Labs  11/14/12 0921 11/15/12 0200  AST 32 27  ALT 19 17  ALKPHOS 266* 213*  BILITOT 0.6 0.3  PROT 7.4 6.6  ALBUMIN 2.2* 2.0*   CBC:  Recent Labs  11/14/12 0921  WBC 12.4*  NEUTROABS 6.6  HGB 12.5  HCT 38.4  MCV 98.5  PLT 159   Cardiac Enzymes:  Recent Labs  11/14/12 2015 11/15/12 0200  TROPONINI 0.97* 0.83*   Thyroid Function Tests:  Recent Labs  11/14/12 2015  TSH 5.107*    Imaging: Dg Chest Bilateral Decubitus  11/14/2012   CLINICAL  DATA:  Evaluate for pleural effusions. Shortness of breath.  EXAM: CHEST - BILATERAL DECUBITUS VIEW  COMPARISON:  11/14/2012 chest x-ray.  FINDINGS: Bilateral pleural effusions are noted to partially layered on decubitus views.  Findings suggestive of congestive heart failure. Sequential pacemaker is in place. Cardiomegaly. Tortuous aorta.  IMPRESSION: Bilateral pleural effusions are noted to partially layered on decubitus views.  Findings suggestive of congestive heart failure.  Sequential pacemaker is in place. Cardiomegaly.  Tortuous aorta.   Electronically Signed   By: Bridgett Larsson   On: 11/14/2012 16:57   Dg Chest Portable 1 View  11/14/2012   CLINICAL DATA:  Shortness of Breath. Congestion. Pneumonia.  EXAM: PORTABLE CHEST - 1 VIEW  COMPARISON:  07/01/2011.  FINDINGS: Sequential pacemaker is in place entering from the left. Full extent of the leads not included. The portions which are visualized do not demonstrate gross interruption. There is narrowing of a proximal lead adjacent the battery pack. Tips appear to be in the region of the right atrium and right ventricle.  Marked cardiomegaly.  Diffuse asymmetric airspace disease with bilateral pleural effusions may represent pulmonary edema. Infectious infiltrate cannot  be excluded in the proper clinical setting. Additionally, underlying mass could not be excluded given the pleural effusions and mediastinal prominence.  No gross pneumothorax.  Remote right-sided rib fractures.  IMPRESSION: Marked cardiomegaly with sequential pacemaker in place.  Asymmetric airspace disease with bilateral pleural effusions may represent pulmonary edema although infectious infiltrate cannot be excluded in proper clinical setting.  Follow-up in the clearance recommended.   Electronically Signed   By: Bridgett Larsson   On: 11/14/2012 09:12    Cardiac Studies:  ECG:  SR LAD LBBB     Telemetry:  NSR rates 80  Echo:   EF 40-45%    Medications:   . ciprofloxacin  250 mg Oral  BID  . furosemide  80 mg Intravenous BID  . [START ON 11/16/2012] pneumococcal 23 valent vaccine  0.5 mL Intramuscular Tomorrow-1000  . sodium chloride  3 mL Intravenous Q12H  . Warfarin - Pharmacist Dosing Inpatient   Does not apply q1800       Assessment/Plan:  SEMI:  By RWMA on echo and enzymes.  ECG with LBBB  Conservative Rx given age and previous stroke  ASA and beta blocker On coumadin before admission CHF:  Continue iv lasix HTN:  Add ACE SSS/PAF:  Continue coumadin normal pacer function will resume home dose of amiodarone  Discussed code status with daughter she has not contemplated DNR status  Charlton Haws 11/15/2012, 8:43 AM

## 2012-11-15 NOTE — Progress Notes (Signed)
ANTICOAGULATION CONSULT NOTE - Follow Up Consult  Pharmacy Consult for Coumadin Indication: atrial fibrillation  Allergies  Allergen Reactions  . Codeine Other (See Comments)    REACTION: unknown  . Sulfonamide Derivatives Other (See Comments)    REACTION:  unknown    Patient Measurements: Height: 5\' 5"  (165.1 cm) Weight: 131 lb 9.8 oz (59.7 kg) IBW/kg (Calculated) : 57  Vital Signs: Temp: 98 F (36.7 C) (09/20 1200) Temp src: Oral (09/20 1200) BP: 129/71 mmHg (09/20 1200) Pulse Rate: 84 (09/20 1200)  Labs:  Recent Labs  11/14/12 11/14/12 0921 11/14/12 2015 11/15/12 0200 11/15/12 0850  HGB  --  12.5  --   --   --   HCT  --  38.4  --   --   --   PLT  --  159  --   --   --   LABPROT  --  23.2*  --   --  33.6*  INR 2.1 2.14*  --   --  3.47*  CREATININE  --  0.47*  --  0.48*  --   TROPONINI  --   --  0.97* 0.83* REPEATED TO VERIFY    Estimated Creatinine Clearance: 40.4 ml/min (by C-G formula based on Cr of 0.48).  Assessment:   Home Coumadin of 1mg  dose given last night, but INR rose from 2.14 to 3.47.  Cipro added last night, which can effect Coumadin dosing, but likely more significantly effected by decreased PO intake.  Daughter relates sensitivity to small Coumadin dose changes, as well as antibiotics. Home regimen: 1 mg MTuWFSat, 0.5 mg ThuSun (uses 2 mg tablets).   Daughter and caregiver, Rod Holler, provided updated med list.  Updated in Epic.  Has been off Amiodarone for about 8 weeks due to pulmonary issues.  Discussed briefly with Dr. Eden Emms. Amiodarone and Hydralazine refused today, now discontinued. To change from Losartan (also refused) to home Diovan 20 mg BID (though she only takes it prn). Home vitamins B-12, C and D3 to resume.  Goal of Therapy:  INR 2-3 Monitor platelets by anticoagulation protocol: Yes   Plan:   No Coumadin today.  Continue daily PT/INR.  Follow up urine culture  Dennie Fetters, RPh Pager: [phone] 11/15/2012,12:40  PM

## 2012-11-16 LAB — BASIC METABOLIC PANEL
BUN: 20 mg/dL (ref 6–23)
CO2: 28 mEq/L (ref 19–32)
Chloride: 96 mEq/L (ref 96–112)
Creatinine, Ser: 0.47 mg/dL — ABNORMAL LOW (ref 0.50–1.10)
GFR calc Af Amer: >90 mL/min (ref >90)
Glucose, Bld: 126 mg/dL — ABNORMAL HIGH (ref 70–99)
Potassium: 3.4 mEq/L — ABNORMAL LOW (ref 3.5–5.1)
Sodium: 133 mEq/L — ABNORMAL LOW (ref 135–145)

## 2012-11-16 LAB — PROTIME-INR: Prothrombin Time: 33.4 seconds — ABNORMAL HIGH (ref 11.6–15.2)

## 2012-11-16 MED ORDER — AMIODARONE HCL IN DEXTROSE 360-4.14 MG/200ML-% IV SOLN
INTRAVENOUS | Status: AC
  Filled 2012-11-16: qty 200

## 2012-11-16 MED ORDER — CARVEDILOL 3.125 MG PO TABS
3.1250 mg | ORAL_TABLET | Freq: Two times a day (BID) | ORAL | Status: DC
  Administered 2012-11-19: 3.125 mg via ORAL
  Filled 2012-11-16 (×10): qty 1

## 2012-11-16 MED ORDER — POTASSIUM CHLORIDE CRYS ER 20 MEQ PO TBCR
40.0000 meq | EXTENDED_RELEASE_TABLET | Freq: Once | ORAL | Status: AC
  Administered 2012-11-16: 40 meq via ORAL
  Filled 2012-11-16: qty 2

## 2012-11-16 MED ORDER — AMIODARONE HCL IN DEXTROSE 360-4.14 MG/200ML-% IV SOLN
60.0000 mg/h | INTRAVENOUS | Status: AC
  Administered 2012-11-16: 60 mg/h via INTRAVENOUS

## 2012-11-16 MED ORDER — AMIODARONE LOAD VIA INFUSION
150.0000 mg | Freq: Once | INTRAVENOUS | Status: AC
  Administered 2012-11-16: 150 mg via INTRAVENOUS
  Filled 2012-11-16: qty 83.34

## 2012-11-16 MED ORDER — AMIODARONE HCL IN DEXTROSE 360-4.14 MG/200ML-% IV SOLN
INTRAVENOUS | Status: AC
  Administered 2012-11-16: 33.3 mL
  Filled 2012-11-16: qty 200

## 2012-11-16 MED ORDER — AMIODARONE HCL IN DEXTROSE 360-4.14 MG/200ML-% IV SOLN
30.0000 mg/h | INTRAVENOUS | Status: DC
  Administered 2012-11-16: 30 mg/h via INTRAVENOUS
  Filled 2012-11-16 (×4): qty 200

## 2012-11-16 NOTE — Progress Notes (Signed)
ANTICOAGULATION CONSULT NOTE - Follow Up Consult  Pharmacy Consult for Coumadin Indication: atrial fibrillation  Allergies  Allergen Reactions  . Codeine Other (See Comments)    REACTION: unknown  . Sulfonamide Derivatives Other (See Comments)    REACTION:  unknown    Patient Measurements: Height: 5\' 5"  (165.1 cm) Weight: 126 lb 1.7 oz (57.2 kg) IBW/kg (Calculated) : 57  Vital Signs: Temp: 97.9 F (36.6 C) (09/21 1132) Temp src: Oral (09/21 1132) BP: 134/63 mmHg (09/21 1400) Pulse Rate: 79 (09/21 1400)  Labs:  Recent Labs  11/14/12 0921 11/14/12 2015 11/15/12 0200 11/15/12 0850 11/16/12 0430  HGB 12.5  --   --   --   --   HCT 38.4  --   --   --   --   PLT 159  --   --   --   --   LABPROT 23.2*  --   --  33.6* 33.4*  INR 2.14*  --   --  3.47* 3.44*  CREATININE 0.47*  --  0.48*  --  0.47*  TROPONINI  --  0.97* 0.83* REPEATED TO VERIFY  --     Estimated Creatinine Clearance: 40.4 ml/min (by C-G formula based on Cr of 0.47).  Assessment:   Home Coumadin of 1mg  dose given on 9/19, but INR rose from 2.14 to 3.47. Coumadin held 9/20. INR still 3.44 today.  Cipro added 9/19, which can effect Coumadin dosing, but likely more significantly effected by decreased PO intake.  Daughter relates sensitivity to small Coumadin dose changes, as well as antibiotics. Home Coumadin regimen: 1 mg MTuWFSat, 0.5 mg ThuSun (uses 2 mg tablets).  Back in Afib last night, and now on Amiodarone drip.  GNR in urine.    Goal of Therapy:  INR 2-3 Monitor platelets by anticoagulation protocol: Yes   Plan:   No Coumadin again today.  Continue daily PT/INR.  Follow up urine culture.  Dennie Fetters, [place] Pager: [phone] 11/16/2012,4:03 PM

## 2012-11-16 NOTE — Progress Notes (Signed)
PT Cancellation Note  Patient Details Name: Dawn Barrett MRN: [id number] DOB: 18-Dec-1918   Cancelled Treatment:    Reason Eval/Treat Not Completed: Patient not medically ready.  Pt in Afib with HR in 130s to 140s.  Will plan to see pt tomorrow.   Dawn Barrett 11/16/2012, 2:58 PM   Jake Shark, PT DPT [phone]

## 2012-11-16 NOTE — Progress Notes (Signed)
Patient ID: Dawn Barrett, female   DOB: 1918/07/27, 79 y.o.   MRN: [id number]    Subjective:  Denies SSCP, palpitations or Dyspnea Daughter thinks she is doing well  Had recent stay at [facility] for respitory distress and is on home oxygen Went into afib last night Daughter refused PO amiodarone for patient yesterday [name] she had been off it For a few weeks since lung problems at [facility]  Objective:  Filed Vitals:   11/16/12 0400 11/16/12 0500 11/16/12 0800 11/16/12 0805  BP: 122/52  118/48   Pulse: 85  75   Temp:   98.4 F (36.9 C)   TempSrc:   Oral   Resp: 21  22   Height:      Weight:  126 lb 1.7 oz (57.2 kg)    SpO2: 98%  98% 97%    Intake/Output from previous day:  Intake/Output Summary (Last 24 hours) at 11/16/12 1478 Last data filed at 11/15/12 2100  Gross per 24 hour  Intake    600 ml  Output    565 ml  Net     35 ml    Physical Exam: Affect appropriate Healthy:  appears stated age HEENT: normal Neck supple with no adenopathy JVP normal no bruits no thyromegaly Lungs inspitory  no wheezing and good diaphragmatic motion Heart:  S1/S2 systolic  murmur, no rub, gallop or click PMI normal Abdomen: benighn, BS positve, no tenderness, no AAA no bruit.  No HSM or HJR Distal pulses intact with no bruits No edema Aphasic  Skin warm and dry No muscular weakness  Lab Results: Basic Metabolic Panel:  Recent Labs  29/56/21 0200 11/16/12 0430  NA 136 133*  K 3.7 3.4*  CL 101 96  CO2 28 28  GLUCOSE 157* 126*  BUN 21 20  CREATININE 0.48* 0.47*  CALCIUM 8.1* 8.5   Liver Function Tests:  Recent Labs  11/14/12 0921 11/15/12 0200  AST 32 27  ALT 19 17  ALKPHOS 266* 213*  BILITOT 0.6 0.3  PROT 7.4 6.6  ALBUMIN 2.2* 2.0*   CBC:  Recent Labs  11/14/12 0921  WBC 12.4*  NEUTROABS 6.6  HGB 12.5  HCT 38.4  MCV 98.5  PLT 159   Cardiac Enzymes:  Recent Labs  11/14/12 2015 11/15/12 0200 11/15/12 0850  TROPONINI 0.97* 0.83* REPEATED TO VERIFY     Thyroid Function Tests:  Recent Labs  11/14/12 2015  TSH 5.107*    Imaging: Dg Chest Bilateral Decubitus  11/14/2012   CLINICAL DATA:  Evaluate for pleural effusions. Shortness of breath.  EXAM: CHEST - BILATERAL DECUBITUS VIEW  COMPARISON:  11/14/2012 chest x-ray.  FINDINGS: Bilateral pleural effusions are noted to partially layered on decubitus views.  Findings suggestive of congestive heart failure. Sequential pacemaker is in place. Cardiomegaly. Tortuous aorta.  IMPRESSION: Bilateral pleural effusions are noted to partially layered on decubitus views.  Findings suggestive of congestive heart failure.  Sequential pacemaker is in place. Cardiomegaly.  Tortuous aorta.   Electronically Signed   By: Bridgett Larsson   On: 11/14/2012 16:57   Dg Chest Portable 1 View  11/14/2012   CLINICAL DATA:  Shortness of Breath. Congestion. Pneumonia.  EXAM: PORTABLE CHEST - 1 VIEW  COMPARISON:  07/01/2011.  FINDINGS: Sequential pacemaker is in place entering from the left. Full extent of the leads not included. The portions which are visualized do not demonstrate gross interruption. There is narrowing of a proximal lead adjacent the battery pack. Tips appear to be  in the region of the right atrium and right ventricle.  Marked cardiomegaly.  Diffuse asymmetric airspace disease with bilateral pleural effusions may represent pulmonary edema. Infectious infiltrate cannot be excluded in the proper clinical setting. Additionally, underlying mass could not be excluded given the pleural effusions and mediastinal prominence.  No gross pneumothorax.  Remote right-sided rib fractures.  IMPRESSION: Marked cardiomegaly with sequential pacemaker in place.  Asymmetric airspace disease with bilateral pleural effusions may represent pulmonary edema although infectious infiltrate cannot be excluded in proper clinical setting.  Follow-up in the clearance recommended.   Electronically Signed   By: Bridgett Larsson   On: 11/14/2012 09:12     Cardiac Studies:  ECG:  SR LAD LBBB     Telemetry:  NSR rates 80  Echo:   EF 40-45%    Medications:   . amiodarone (NEXTERONE PREMIX) 360 mg/200 mL dextrose      . cholecalciferol  2,000 Units Oral Daily  . ciprofloxacin  250 mg Oral BID  . furosemide  40 mg Intravenous BID  . levalbuterol  1.25 mg Nebulization Q6H  . pneumococcal 23 valent vaccine  0.5 mL Intramuscular Tomorrow-1000  . potassium chloride  40 mEq Oral Once  . sodium chloride  3 mL Intravenous Q12H  . valsartan  20 mg Oral BID  . vitamin B-12  1,000 mcg Oral Daily  . vitamin C  500 mg Oral Daily  . Warfarin - Pharmacist Dosing Inpatient   Does not apply q1800       Assessment/Plan:  SEMI:  By RWMA on echo and enzymes.  ECG with LBBB  Conservative Rx given age and previous stroke  ASA and beta blocker On coumadin before admission INR over 3 follow  CHF:  Continue iv lasix not much output  Repeat CXR in am.  Effusions fairly small 2 days ago  HTN:  Add ACE on diovan in past  SSS/PAF:  Will add iv amiodarone with hopes to convert.  Daughter more rediscent to take PO amiodarone now   Discussed code status with daughter she has not contemplated DNR status  Charlton Haws 11/16/2012, 9:04 AM

## 2012-11-17 ENCOUNTER — Inpatient Hospital Stay (HOSPITAL_COMMUNITY): Payer: Medicare Other

## 2012-11-17 LAB — PROTIME-INR: INR: 2.62 — ABNORMAL HIGH (ref 0.00–1.49)

## 2012-11-17 LAB — CBC
HCT: 31.2 % — ABNORMAL LOW (ref 36.0–46.0)
Hemoglobin: 10.4 g/dL — ABNORMAL LOW (ref 12.0–15.0)
MCV: 96.3 fL (ref 78.0–100.0)
Platelets: 135 10*3/uL — ABNORMAL LOW (ref 150–400)
RBC: 3.24 MIL/uL — ABNORMAL LOW (ref 3.87–5.11)
RDW: 15.7 % — ABNORMAL HIGH (ref 11.5–15.5)
WBC: 8.1 10*3/uL (ref 4.0–10.5)

## 2012-11-17 LAB — URINE CULTURE: Colony Count: >=100000

## 2012-11-17 MED ORDER — AMIODARONE HCL 200 MG PO TABS
200.0000 mg | ORAL_TABLET | Freq: Two times a day (BID) | ORAL | Status: DC
  Administered 2012-11-17 – 2012-11-18 (×3): 200 mg via ORAL
  Filled 2012-11-17 (×4): qty 1

## 2012-11-17 MED ORDER — WARFARIN 0.5 MG HALF TABLET
0.5000 mg | ORAL_TABLET | Freq: Once | ORAL | Status: AC
  Administered 2012-11-17: 0.5 mg via ORAL
  Filled 2012-11-17: qty 1

## 2012-11-17 NOTE — Progress Notes (Signed)
ANTICOAGULATION CONSULT NOTE - Follow Up Consult  Pharmacy Consult for Coumadin Indication: atrial fibrillation  Allergies  Allergen Reactions  . Codeine Other (See Comments)    REACTION: unknown  . Sulfonamide Derivatives Other (See Comments)    REACTION:  unknown    Patient Measurements: Height: 5\' 5"  (165.1 cm) Weight: 125 lb 10.6 oz (57 kg) IBW/kg (Calculated) : 57  Vital Signs: Temp: 98.8 F (37.1 C) (09/22 0744) Temp src: Axillary (09/22 0744) BP: 112/50 mmHg (09/22 0800) Pulse Rate: 68 (09/22 0800)  Labs:  Recent Labs  11/14/12 0921 11/14/12 2015 11/15/12 0200 11/15/12 0850 11/16/12 0430 11/17/12 0650  HGB 12.5  --   --   --   --  10.4*  HCT 38.4  --   --   --   --  31.2*  PLT 159  --   --   --   --  135*  LABPROT 23.2*  --   --  33.6* 33.4* 27.1*  INR 2.14*  --   --  3.47* 3.44* 2.62*  CREATININE 0.47*  --  0.48*  --  0.47*  --   TROPONINI  --  0.97* 0.83* REPEATED TO VERIFY  --   --     Estimated Creatinine Clearance: 40.4 ml/min (by C-G formula based on Cr of 0.47).  Assessment: Home Coumadin of 1mg  dose given on 9/19, but INR rose from 2.14 to 3.47. Coumadin held 9/20. INR still 2.62 today.  Cipro added 9/19, which can effect Coumadin dosing, but likely more significantly effected by decreased PO intake.   Daughter relates sensitivity to small Coumadin dose changes, as well as antibiotics.  Home Coumadin regimen: 1 mg MTuWFSat, 0.5 mg ThuSun (uses 2 mg tablets).  Amiodarone continues    Goal of Therapy:  INR 2-3 Monitor platelets by anticoagulation protocol: Yes   Plan:  Coumadin 0.5 mg po x 1 today Continue daily PT/INR.  Thank you. Okey Regal, PharmD [phone]  11/17/2012,9:16 AM

## 2012-11-17 NOTE — Progress Notes (Signed)
Patient ID: Dawn Barrett, female   DOB: 08-25-1918, 79 y.o.   MRN: [id number]    Subjective:  Denies SSCP, palpitations or Dyspnea No cough Reviewed history  Hx of aspiration following stroke, now on aspiration precautions Eval at [facility] equivocal for amio, it was stopped, as was everything else by hospicen except apparently coumadin  Objective:  Filed Vitals:   11/17/12 0700 11/17/12 0742 11/17/12 0744 11/17/12 0800  BP: 157/54   112/50  Pulse: 70  61 68  Temp:   98.8 F (37.1 C)   TempSrc:   Axillary   Resp: 23  20 18   Height:      Weight:      SpO2: 98% 99% 99% 99%    Intake/Output from previous day:  Intake/Output Summary (Last 24 hours) at 11/17/12 0945 Last data filed at 11/17/12 0800  Gross per 24 hour  Intake  556.9 ml  Output      0 ml  Net  556.9 ml    Physical Exam: Affect appropriate Healthy:  appears stated age HEENT: normal Neck supple with no adenopathy JVP normal no bruits no thyromegaly Lungs inspitory  no wheezing and good diaphragmatic motion Heart:  S1/S2 systolic  murmur, no rub, gallop or click  Abdomen: soft active BS    No HSM or HJR  No edema Aphasic  Skin warm and dry    Lab Results: Basic Metabolic Panel:  Recent Labs  21/30/86 0200 11/16/12 0430  NA 136 133*  K 3.7 3.4*  CL 101 96  CO2 28 28  GLUCOSE 157* 126*  BUN 21 20  CREATININE 0.48* 0.47*  CALCIUM 8.1* 8.5   Liver Function Tests:  Recent Labs  11/15/12 0200  AST 27  ALT 17  ALKPHOS 213*  BILITOT 0.3  PROT 6.6  ALBUMIN 2.0*   CBC:  Recent Labs  11/17/12 0650  WBC 8.1  HGB 10.4*  HCT 31.2*  MCV 96.3  PLT 135*   Cardiac Enzymes:  Recent Labs  11/14/12 2015 11/15/12 0200 11/15/12 0850  TROPONINI 0.97* 0.83* REPEATED TO VERIFY   Thyroid Function Tests:  Recent Labs  11/14/12 2015  TSH 5.107*    Imaging: Dg Chest Port 1 View  11/17/2012   CLINICAL DATA:  Shortness of breast he  EXAM: PORTABLE CHEST - 1 VIEW  COMPARISON:  Single  view of the chest 11/14/2012 and PA and lateral chest 07/01/2011.  FINDINGS: There is cardiomegaly. Airspace disease is again seen throughout the right chest. A milder degree of airspace disease is seen in the left mid and lower lung zones. There are bilateral pleural effusions.  IMPRESSION: Cardiomegaly with bilateral pleural effusions and right worse than left airspace disease which could represent asymmetric edema or pneumonia.   Electronically Signed   By: Drusilla Kanner M.D.   On: 11/17/2012 05:40    Cardiac Studies:  ECG:  SR LAD LBBB     Telemetry:  NSR rates 80  Echo:   EF 40-45%    Medications:   . carvedilol  3.125 mg Oral BID WC  . cholecalciferol  2,000 Units Oral Daily  . ciprofloxacin  250 mg Oral BID  . furosemide  40 mg Intravenous BID  . levalbuterol  1.25 mg Nebulization Q6H  . pneumococcal 23 valent vaccine  0.5 mL Intramuscular Tomorrow-1000  . sodium chloride  3 mL Intravenous Q12H  . valsartan  20 mg Oral BID  . vitamin B-12  1,000 mcg Oral Daily  . vitamin C  500 mg Oral Daily  . warfarin  0.5 mg Oral ONCE-1800  . Warfarin - Pharmacist Dosing Inpatient   Does not apply q1800     . amiodarone (NEXTERONE PREMIX) 360 mg/200 mL dextrose 30 mg/hr (11/16/12 2127)    Assessment/Plan:  SEMI:  By RWMA on echo and enzymes.  ECG with LBBB  Conservative Rx given age and previous stroke  ASA and beta blocker On coumadin before admission INR over 3 follow  Possibly type 2 from PAF RVR  CHF:  D/c lasix  Suspect 2/2 Afib RVR HTN:  soninue coreg SSS/PAF:  Discussed po amio with daughter,  Alternative would be AV ablation as QT too long for tikosyn.  She is aware of concerns and uncertainty at [facility] but favors amio for control of PAF and expresses understanding of risk UTI continue cipro  GNR+   She was previously on hospice   Will review DNR with daughter   Sherryl Manges 11/17/2012, 9:45 AM

## 2012-11-17 NOTE — Evaluation (Signed)
Physical Therapy Evaluation Patient Details Name: Dawn Barrett MRN: [id number] DOB: [birth year] Today's Date: 11/17/2012 Time: 4098-1191 PT Time Calculation (min): 40 min  PT Assessment / Plan / Recommendation History of Present Illness  Pt adm with hypoxia and SEMI.  Pt also with A-fib with RVR.  Clinical Impression  Pt presents to PT requiring total assist for all mobility.  Recommend skilled PT to maximize basic bed mobility and sitting balance to decr burden of caregivers.  Recommend hoyer lift for pt at home to improve safety for pt and for caregivers. Recommend nursing continue to use maximove or maxisky for OOB.    PT Assessment  Patient needs continued PT services    Follow Up Recommendations  Supervision/Assistance - 24 hour    Does the patient have the potential to tolerate intense rehabilitation      Barriers to Discharge        Equipment Recommendations  Other (comment) (hoyer lift)    Recommendations for Other Services     Frequency Min 2X/week    Precautions / Restrictions Precautions Precautions: Fall   Pertinent Vitals/Pain  VSS      Mobility  Bed Mobility Bed Mobility: Supine to Sit;Sitting - Scoot to Edge of Bed;Sit to Supine Supine to Sit: 1: +1 Total assist (Pt = 10%) Sitting - Scoot to Edge of Bed: 1: +1 Total assist (pt = 0%) Sit to Supine: 1: +1 Total assist (pt=0%) Details for Bed Mobility Assistance: Assist for all aspects.  Pt only able to assist by helping to move legs toward EOB in supine. Transfers Transfer via Lift Equipment: Maximove    Exercises     PT Diagnosis: Generalized weakness;Difficulty walking  PT Problem List: Decreased strength;Decreased activity tolerance;Decreased balance;Decreased mobility PT Treatment Interventions: DME instruction;Functional mobility training;Therapeutic activities;Therapeutic exercise;Balance training;Patient/family education     PT Goals(Current goals can be found in the care plan  section) Acute Rehab PT Goals Patient Stated Goal: pt nonverbal PT Goal Formulation: Patient unable to participate in goal setting Time For Goal Achievement: 11/24/12 Potential to Achieve Goals: Fair  Visit Information  Last PT Received On: 11/17/12 Assistance Needed: +2 History of Present Illness: Pt adm with hypoxia and SEMI.  Pt also with A-fib with RVR.       Prior Functioning  Home Living Family/patient expects to be discharged to:: Private residence Available Help at Discharge: Personal care attendant;Available 24 hours/day Prior Function Level of Independence: Needs assistance Gait / Transfers Assistance Needed: 2 person lift to chair per caregiver    Cognition  Cognition Arousal/Alertness: Awake/alert Overall Cognitive Status: Difficult to assess Difficult to assess due to: Impaired communication (pt nonverbal)    Extremity/Trunk Assessment Lower Extremity Assessment Lower Extremity Assessment: RLE deficits/detail;LLE deficits/detail RLE Deficits / Details: less than 2+/5 LLE Deficits / Details: less than 3/5   Balance Balance Balance Assessed: Yes Static Sitting Balance Static Sitting - Balance Support: Bilateral upper extremity supported;Feet supported Static Sitting - Level of Assistance: 1: +1 Total assist;2: Max assist Static Sitting - Comment/# of Minutes: pt sat EOB x 5 minutes with max to total assist.  Pt with heavy posterior lean which worsened as pt fatigued. Used verbal/tactile cues for pt to bring shoulders anteriorly with initial improvement but then unable to do due to fatigue.  End of Session PT - End of Session Equipment Utilized During Treatment: Other (comment) (maximove) Activity Tolerance: Patient limited by fatigue Patient left: in chair;with call bell/phone within reach;with family/visitor present Nurse Communication: Need for lift equipment;Mobility  status  GP     Jinny Sweetland 11/17/2012, 12:23 PM  [facility] PT [phone]

## 2012-11-17 NOTE — Progress Notes (Signed)
Utilization Review Completed.Dawn Barrett T9/22/2014  

## 2012-11-18 DIAGNOSIS — I214 Non-ST elevation (NSTEMI) myocardial infarction: Secondary | ICD-10-CM | POA: Diagnosis present

## 2012-11-18 LAB — PROTIME-INR: INR: 2.42 — ABNORMAL HIGH (ref 0.00–1.49)

## 2012-11-18 MED ORDER — HALOPERIDOL 0.5 MG PO TABS
0.5000 mg | ORAL_TABLET | Freq: Every evening | ORAL | Status: DC | PRN
  Filled 2012-11-18: qty 1

## 2012-11-18 MED ORDER — BIOTENE DRY MOUTH MT LIQD
15.0000 mL | Freq: Two times a day (BID) | OROMUCOSAL | Status: DC
  Administered 2012-11-18 – 2012-11-21 (×7): 15 mL via OROMUCOSAL

## 2012-11-18 MED ORDER — WARFARIN SODIUM 1 MG PO TABS
1.0000 mg | ORAL_TABLET | Freq: Once | ORAL | Status: AC
  Administered 2012-11-18: 1 mg via ORAL
  Filled 2012-11-18 (×3): qty 1

## 2012-11-18 MED ORDER — AMIODARONE HCL 200 MG PO TABS
400.0000 mg | ORAL_TABLET | Freq: Three times a day (TID) | ORAL | Status: DC
  Administered 2012-11-18 – 2012-11-19 (×5): 400 mg via ORAL
  Filled 2012-11-18 (×9): qty 2

## 2012-11-18 NOTE — Discharge Planning (Signed)
Pt is active with [facility] (RN, PT, OT). Please obtain resumption of care orders at discharge.

## 2012-11-18 NOTE — Progress Notes (Signed)
Patient: Dawn Barrett Date of Encounter: 11/18/2012, 7:17 AM Admit date: 11/14/2012     Subjective  Ms. Rounds is accompanied by her caretaker who reports she has had a "thick" productive cough all night. No fever. No SOB. Unsure about pain but doesn't think so. Family refusing Coreg.    Objective  Physical Exam: Vitals: BP 125/41  Pulse 75  Temp(Src) 98.3 F (36.8 C) (Oral)  Resp 19  Ht 5\' 5"  (1.651 m)  Wt 127 lb 10.3 oz (57.9 kg)  BMI 21.24 kg/m2  SpO2 94% General: Well developed, elderly 79 year old female in no acute distress. Neck: Supple. JVD not elevated. Lungs: Clear bilaterally to auscultation without wheezes, rales, or rhonchi. Breathing is unlabored. Heart: Regular S1 S2 without murmur, rub or gallop.  Abdomen: Soft, non-distended. Extremities: No clubbing or cyanosis. Trace edema.    Intake/Output:  Intake/Output Summary (Last 24 hours) at 11/18/12 0717 Last data filed at 11/17/12 2200  Gross per 24 hour  Intake  893.4 ml  Output      0 ml  Net  893.4 ml    Inpatient Medications:  . amiodarone  200 mg Oral BID  . antiseptic oral rinse  15 mL Mouth Rinse BID  . carvedilol  3.125 mg Oral BID WC  . cholecalciferol  2,000 Units Oral Daily  . ciprofloxacin  250 mg Oral BID  . levalbuterol  1.25 mg Nebulization Q6H  . sodium chloride  3 mL Intravenous Q12H  . vitamin B-12  1,000 mcg Oral Daily  . vitamin C  500 mg Oral Daily  . Warfarin - Pharmacist Dosing Inpatient   Does not apply q1800    Labs:  Recent Labs  11/16/12 0430  NA 133*  K 3.4*  CL 96  CO2 28  GLUCOSE 126*  BUN 20  CREATININE 0.47*  CALCIUM 8.5    Recent Labs  11/17/12 0650  WBC 8.1  HGB 10.4*  HCT 31.2*  MCV 96.3  PLT 135*    Recent Labs  11/18/12 0458  INR 2.42*    Radiology/Studies: Dg Chest Bilateral Decubitus 11/14/2012    IMPRESSION: Bilateral pleural effusions are noted to partially layered on decubitus views.  Findings suggestive of congestive  heart failure.  Sequential pacemaker is in place. Cardiomegaly.  Tortuous aorta.    Electronically Signed By: Bridgett Larsson On: 11/14/2012 16:57   Dg Chest Port 1 View 11/17/2012    IMPRESSION: Cardiomegaly with bilateral pleural effusions and right worse than left airspace disease which could represent asymmetric edema or pneumonia.    Electronically Signed By: Drusilla Kanner M.D. On: 11/17/2012 05:40   Echocardiogram: Study Conclusions - Left ventricle: The cavity size was normal. Wall thickness was increased in a pattern of mild LVH. There was moderate focal basal hypertrophy of the septum. Systolic function was mildly to moderately reduced. The estimated ejection fraction was in the range of 40% to 45%. There is akinesis of the apical myocardium. Doppler parameters are consistent with abnormal left ventricular relaxation (grade 1 diastolic dysfunction). Doppler parameters are consistent with high ventricular filling pressure. - Mitral valve: Calcified annulus. There was moderate systolic anterior motion of the anterior leaflet, posterior leaflet, and chordal structures. Moderate regurgitation. - Left atrium: The atrium was mildly dilated. - Tricuspid valve: Mild-moderate regurgitation. - Pulmonary arteries: Systolic pressure was moderately increased. PA peak pressure: 61mm Hg (S). - Pericardium, extracardiac: A trivial pericardial effusion was identified. Impressions: - Apical akinesis with hyperdynamic basal function; overall  EF 40-45; trivial pericardial effusion; mitral SAM with moderate MR; LVOT gradient not well assessed but at least 2.4 m/s; findings consistent with HOCM.  Telemetry: SR with intermittent V pacing in last 24 hours; prior AF w/RVR    Assessment and Plan  1. SEMI - by RWMA on echo and enzymes. ECG with LBBB. Conservative treatment given age and previous CVA. ASA and BB (although family has refused Coreg due to concerns regarding hypotension) 2. Acute  systolic HF - Lasix d/c'd yesterday 3. PAF - on Coumadin prior to admission - amiodarone restarted  4. Bradycardia, with PPM in place  Dr. Graciela Husbands to see Signed, EDMISTEN, BROOKE PA-C  Continue current course  Will review daughter re coreg Await input from ST and OT

## 2012-11-18 NOTE — Progress Notes (Signed)
Just spoke with patients daughter regarding medications to be administered. Daughter enquired about patients vitals. BP 125/44, HR 79 with intermittent atrial pacing, 98% O2 saturation on 2L, with respirations 20. RN discussed medications to be administered including amiodarone 200mg  and coreg 3.125. Also discussed mechanism of action and side effects of the medication. Discussed with daughter that BP is with parameters to administer the medication. Daughter stated that she felt BP was too low to administer meds as stated above and requested Dr. Graciela Husbands be notified of BP prior to administration. Daughter also stated that it was her goal for mother to be discharged today!! Discussed with daughter PTs recommendation for lift r/t patients inability to participate in the moving/transfering process. Daughter stated that although they would work on getting a lift, they are able to have mother lifted for transfer. RN put page into Dr. Graciela Husbands and awaiting return phone call. Daughter also requested Dr.Klein call her to discuss BP and acceptable parameters and to update her on mothers discharge.

## 2012-11-18 NOTE — Progress Notes (Signed)
OT Cancellation Note  Patient Details Name: Dawn Barrett MRN: [id number] DOB: [birth year]   Cancelled Treatment:    Reason Eval/Treat Not Completed: OT screened, no needs identified, will sign off. Pt with baseline dependence in mobility and ADL. Has 2 caregivers.  All equipment needs are met.  No interest in lift equipment per caregivers.  Evern Bio 11/18/2012, 10:12 AM

## 2012-11-18 NOTE — Discharge Summary (Signed)
CARDIOLOGY DISCHARGE SUMMARY   Patient ID: Dawn Barrett MRN: [id number] DOB/AGE: 1918/12/25 79 y.o.  Admit date: 11/14/2012 Discharge date: 11/21/2012  Primary Discharge Diagnosis:    Acute combined systolic and diastolic CHF, NYHA class 4   Secondary Discharge Diagnosis:    SEMI (subendocardial myocardial infarction), initial episode of care   Atrial fibrillation   Pacemaker-[employer]   PNA   UTI  Procedures: 2-D echocardiogram  Hospital Course: Dawn Barrett is a 78 y.o. female with no history of CAD. She had shortness of breath not relieved by home nebulizers and home oxygen. She came to the emergency room and was hypoxic. Her initial troponin was elevated. Her heart rate was rapid. She was admitted for further evaluation and treatment.  1. Acute CHF: She was diuresed with IV Lasix. Her renal function, intake/output and daily weights were followed during hospital stay. She lost 6 pounds during her hospital stay. Her Lasix was d/c'd for 2 days because of worsening renal function but her volume status worsened. The Lasix was restarted and her electrolytes plus weights will be followed after discharge. Her discharge weight was 128 pounds. As she diuresed, her volume status improved, and her respiratory status improved. By discharge, she was back to her baseline.   2. Subendocardial MI: Her initial enzymes were slightly elevated. They trended upwards indicating a non-ST segment elevation MI. The patient was thoroughly evaluated and discussions were held with the family. She had had no chest pain. She had no ST elevation on her ECG. Medical therapy was recommended. She was noted to have a regional wall motion abnormality on her echocardiogram. She is currently tolerating aspirin, but she did not tolerate a beta blocker. Her systolic blood pressure dropped into the 80's on Coreg at the lowest dose. No statin was ordered because of abnormal liver function tests.  3. Atrial  fibrillation: She has a history of bradycardia and a pacemaker is in place. On admission, she was tachycardic (in atrial fibrillation) and not pacing. However, the pacemaker function in the past has been normal. She was on amiodarone, but the dose was increased because of the atrial fibrillation. The amiodarone was increased to 400 mg TID in the hospital and she converted to sinus rhythm. She will go home on 400 mg BID for a week, then 400 mg daily.  4. Anticoagulation with Coumadin: Her INR was therapeutic on admission. She was continued on Coumadin and this was monitored by pharmacy. She is therapeutic at discharge and is to resume her normal checks as an outpatient.  5. Social issues: Dawn Barrett lives with family. She has 24-hour caregivers. Because of a previous CVA, she was on thickened liquids. She was seen by speech and evaluated. She was felt safe for nectar thick liquids, dysphagia 3 diet and this will be ordered.  6. PNA - She had a cough and ran some fevers. She was started on Unasyn 9/24 and will complete antibiotics with Augmentin as an outpatient.  7. UTI - On admission, a UA was performed which showed many bacteria and positive leukocyte esterace. A urine culture was also performed and was positive for > 100,000 colonies of Klebsiella pneumoniae, sensitive to Cipro. She was placed on Cipro for 7 days, completing therapy prior to discharge.  8. Hypokalemia - Her potassium required supplementation with diuresis and she will be on short-term KCl at discharge. Liquid given at the family's request. Then, as needed.   By 11/18/2012, [name]'s physical status was felt to be  at baseline. She was evaluated by Dr. Delton See and considered stable for discharge, to follow up as an outpatient.  Labs:  Lab Results  Component Value Date   WBC 6.3 11/19/2012   HGB 12.2 11/19/2012   HCT 37.7 11/19/2012   MCV 96.9 11/19/2012   PLT 120* 11/19/2012     Recent Labs Lab 11/15/12 0200   11/20/12 0801  NA 136  < > 135  K 3.7  < > 3.5  CL 101  < > 99  CO2 28  < > 31  BUN 21  < > 25*  CREATININE 0.48*  < > 0.69  CALCIUM 8.1*  < > 8.0*  PROT 6.6  --   --   BILITOT 0.3  --   --   ALKPHOS 213*  --   --   ALT 17  --   --   AST 27  --   --   GLUCOSE 157*  < > 102*  < > = values in this interval not displayed. Troponin I  Date Value Range Status  11/15/2012 REPEATED TO VERIFY  <0.30 ng/mL Final  11/15/2012 0.83* <0.30 ng/mL Final  11/14/2012 0.97* <0.30 ng/mL Final   Pro B Natriuretic peptide (BNP)  Date/Time Value Range Status  11/14/2012  9:28 AM 3007.0* 0 - 450 pg/mL Final    Recent Labs  11/21/12 0540  INR 2.33*      Radiology:  Dg Chest Bilateral Decubitus 11/14/2012   CLINICAL DATA:  Evaluate for pleural effusions. Shortness of breath.  EXAM: CHEST - BILATERAL DECUBITUS VIEW  COMPARISON:  11/14/2012 chest x-ray.  FINDINGS: Bilateral pleural effusions are noted to partially layered on decubitus views.  Findings suggestive of congestive heart failure. Sequential pacemaker is in place. Cardiomegaly. Tortuous aorta.  IMPRESSION: Bilateral pleural effusions are noted to partially layered on decubitus views.  Findings suggestive of congestive heart failure.  Sequential pacemaker is in place. Cardiomegaly.  Tortuous aorta.   Electronically Signed   By: Bridgett Larsson   On: 11/14/2012 16:57   Dg Chest Port 1 View 11/17/2012   CLINICAL DATA:  Shortness of breast he  EXAM: PORTABLE CHEST - 1 VIEW  COMPARISON:  Single view of the chest 11/14/2012 and PA and lateral chest 07/01/2011.  FINDINGS: There is cardiomegaly. Airspace disease is again seen throughout the right chest. A milder degree of airspace disease is seen in the left mid and lower lung zones. There are bilateral pleural effusions.  IMPRESSION: Cardiomegaly with bilateral pleural effusions and right worse than left airspace disease which could represent asymmetric edema or pneumonia.   Electronically Signed   By: Drusilla Kanner M.D.   On: 11/17/2012 05:40   Dg Chest Portable 1 View 11/14/2012   CLINICAL DATA:  Shortness of Breath. Congestion. Pneumonia.  EXAM: PORTABLE CHEST - 1 VIEW  COMPARISON:  07/01/2011.  FINDINGS: Sequential pacemaker is in place entering from the left. Full extent of the leads not included. The portions which are visualized do not demonstrate gross interruption. There is narrowing of a proximal lead adjacent the battery pack. Tips appear to be in the region of the right atrium and right ventricle.  Marked cardiomegaly.  Diffuse asymmetric airspace disease with bilateral pleural effusions may represent pulmonary edema. Infectious infiltrate cannot be excluded in the proper clinical setting. Additionally, underlying mass could not be excluded given the pleural effusions and mediastinal prominence.  No gross pneumothorax.  Remote right-sided rib fractures.  IMPRESSION: Marked cardiomegaly with sequential pacemaker in  place.  Asymmetric airspace disease with bilateral pleural effusions may represent pulmonary edema although infectious infiltrate cannot be excluded in proper clinical setting.  Follow-up in the clearance recommended.   Electronically Signed   By: Bridgett Larsson   On: 11/14/2012 09:12   EKG: 16-Nov-2012 08:37:57  Atrial fibrillation with rapid ventricular response Left axis deviation Left bundle branch block Vent. rate 141 BPM PR interval * ms QRS duration 128 ms QT/QTc 372/569 ms P-R-T axes * -51 153  Echo:  11/14/2012 Study Conclusions - Left ventricle: The cavity size was normal. Wall thickness was increased in a pattern of mild LVH. There was moderate focal basal hypertrophy of the septum. Systolic function was mildly to moderately reduced. The estimated ejection fraction was in the range of 40% to 45%. There is akinesis of the apical myocardium. Doppler parameters are consistent with abnormal left ventricular relaxation (grade 1 diastolic dysfunction). Doppler parameters  are consistent with high ventricular filling pressure. - Mitral valve: Calcified annulus. There was moderate systolic anterior motion of the anterior leaflet, posterior leaflet, and chordal structures. Moderate regurgitation. - Left atrium: The atrium was mildly dilated. - Tricuspid valve: Mild-moderate regurgitation. - Pulmonary arteries: Systolic pressure was moderately increased. PA peak pressure: 61mm Hg (S). - Pericardium, extracardiac: A trivial pericardial effusion was identified. Impressions: - Apical akinesis with hyperdynamic basal function; overall EF 40-45; trivial pericardial effusion; mitral SAM with moderate MR; LVOT gradient not well assessed but at least 2.4 m/s; findings consistent with HOCM.  FOLLOW UP PLANS AND APPOINTMENTS Allergies  Allergen Reactions  . Codeine Other (See Comments)    REACTION: unknown  . Sulfonamide Derivatives Other (See Comments)    REACTION:  unknown     Medication List    STOP taking these medications       valsartan 40 MG tablet  Commonly known as:  DIOVAN      TAKE these medications       albuterol 1.25 MG/3ML nebulizer solution  Commonly known as:  ACCUNEB  Take 1 ampule by nebulization every 6 (six) hours as needed for wheezing.     amiodarone 200 MG tablet  Commonly known as:  PACERONE  Take 2 tablets (400 mg total) by mouth 2 (two) times daily. 2 tabs 2 x day for 1 week, then 2 tabs daily.     amoxicillin-clavulanate 500-125 MG per tablet  Commonly known as:  AUGMENTIN  Take 1 tablet (500 mg total) by mouth 2 (two) times daily.     cholecalciferol 1000 UNITS tablet  Commonly known as:  VITAMIN D  Take 2,000 Units by mouth daily.     Cyanocobalamin 1000 MCG/15ML Liqd  Take 1,000 mcg by mouth daily.     furosemide 40 MG tablet  Commonly known as:  LASIX  Take 1 tablet (40 mg total) by mouth daily.     nitroGLYCERIN 0.4 MG SL tablet  Commonly known as:  NITROSTAT  Place 1 tablet (0.4 mg total) under the  tongue every 5 (five) minutes x 3 doses as needed for chest pain.     nystatin 100000 UNIT/GM Powd  Apply 1 g topically daily as needed (to affected area).     potassium chloride 20 MEQ/15ML (10%) Soln  Take 7.5 mLs (10 mEq total) by mouth daily. Take daily for 1 week, then as directed.     vitamin C 500 MG tablet  Commonly known as:  ASCORBIC ACID  Take 500 mg by mouth daily.     warfarin 1  MG tablet  Commonly known as:  COUMADIN  Take 1 tablet (1 mg total) by mouth daily at 6 PM. 1 mg Mon-Tues-Wed-Fri-Sat; 0.5 mg Thursday and Sunday        Discharge Orders   Future Appointments Provider Department Dept Phone   11/26/2012 3:15 PM Duke Salvia, MD [facility] [place] Office [phone]   11/26/2012 3:30 PM [facility] [place] Office [phone]   01/07/2013 10:00 AM [facility] [place] Office [phone]   02/02/2013 3:30 PM [facility] [phone]   Future Orders Complete By Expires   (HEART FAILURE PATIENTS) Call MD:  Anytime you have any of the following symptoms: 1) 3 pound weight gain in 24 hours or 5 pounds in 1 week 2) shortness of breath, with or without a dry hacking cough 3) swelling in the hands, feet or stomach 4) if you have to sleep on extra pillows at night in order to breathe.  As directed    Diet - low sodium heart healthy  As directed    Increase activity slowly  As directed      Follow-up Information   Follow up with Sherryl Manges, MD On 11/26/2012. (See MD at 3:15 pm, coumadin check at 3:30 pm)    Specialty:  Cardiology   Contact information:   [address] [place] [address] [phone]       BRING ALL MEDICATIONS WITH YOU TO FOLLOW UP APPOINTMENTS  Time spent with patient to include physician time: 47 min Signed: Theodore Demark, PA-C 11/21/2012, 10:45 AM Co-Sign MD  Tobias Alexander, H 11/21/2012

## 2012-11-18 NOTE — Progress Notes (Signed)
Patient noted to be back in afib with RVR. BP remains stable. Patient nods head no to pain. Nods head yes to "Do you feel bad". Minimal shortness of breath noted. O2 sats remain above 93%. Theodore Demark, PA notified. No new orders at this time. Will monitor patient closely and notify patients daughter Rod Holler.

## 2012-11-18 NOTE — Care Management Note (Addendum)
    Page 1 of 2   11/21/2012     11:23:13 AM   CARE MANAGEMENT NOTE 11/21/2012  Patient:  Dawn Barrett, Dawn Barrett   Account Number:  [id number]  Date Initiated:  11/17/2012  Documentation initiated by:  Junius Creamer  Subjective/Objective Assessment:   adm w chf, at fib     Action/Plan:   lives w fam, has home o2  has caregivers   Anticipated DC Date:  11/21/2012   Anticipated DC Plan:  HOME W HOME HEALTH SERVICES      DC Planning Services  CM consult      [facility] Choice  Resumption Of Svcs/PTA Provider  DURABLE MEDICAL EQUIPMENT   Choice offered to / List presented to:     DME arranged  [facility] - MANUAL      DME agency  [facility]     HH arranged  HH-1 RN  HH-2 PT  HH-3 OT      [facility] agency  [facility]   Status of service:   Medicare Important Message given?   (If response is "NO", the following Medicare IM given date fields will be blank) Date Medicare IM given:   Date Additional Medicare IM given:    Discharge Disposition:  HOME W HOME HEALTH SERVICES  Per UR Regulation:  Reviewed for med. necessity/level of care/duration of stay  If discussed at Long Length of Stay Meetings, dates discussed:   11/20/2012    Comments:  9/26  1121a debbie Kebin Maye rn,bsn spoke w da [facility]. they want to cont hhc. they do request high back wheelchair. rhonda pt put in for resuming hhc and order put in for high back w/c. they get o2 from [facility] but req w/c from [facility]. ref has been placed. amb form on chart for dc home today.  9/23 1032a debbie Vinnie Gombert rn,bsn kristen w [facility] called [phone] and pt act w [facility] pta. has 24hr caregivers and supp fam. poss dc later today. will alert [facility].

## 2012-11-18 NOTE — Progress Notes (Signed)
Called to see pt re: PAF/RVR.   Pt went suddenly back into atrial fib, RVR. BB has been refused due to concerns about her BP going too low.   In atrial fib, pt feels more SOB and is weaker than previously seen.   Spoke with SK, will cancel D/C, increase amiodarone to 400 mg tid and follow closely.   Spoke with Ms. Vasco ([place]) over the phone. Updated her on pt condition and cancellation of d/c. She agrees.   She reviewed code status of pt. DCCV is OK, but no intubation, no restarting heart if it stops. Code status orders reflect this.

## 2012-11-18 NOTE — Evaluation (Signed)
Clinical/Bedside Swallow Evaluation Patient Details  Name: Dawn Barrett MRN: [id number] Date of Birth: 1918/12/09  Today's Date: 11/18/2012 Time: 1010-1026 SLP Time Calculation (min): 16 min  Past Medical History:  Past Medical History  Diagnosis Date  . Stroke   . Hypertension   . COPD (chronic obstructive pulmonary disease)   . Paroxysmal atrial fibrillation    Past Surgical History:  Past Surgical History  Procedure Laterality Date  . Pacemaker insertion  11/19/2007    St. Jude Zephyr 5020 pulse generator, serial [id number]  . Femur surgery Right 06/2011    Rod inserted after a fall.    HPI:  79 y.o. female admitted with hypoxia, respiratory distress, SEMI. Hx of HTN, PAF (on coumadin), COPD, and no known h/o CAD; has pacemaker.   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  CXR: Cardiomegaly with bilateral pleural effusions and right worse than left airspace disease  Pt currently on dys 1 diet.  Totally dependent for care PTA.  Potential for D/C home today.     Assessment / Plan / Recommendation Clinical Impression  Pt has hx of dysphagia with diet/liquid modifications.  Has been eating purees and honey-thick liquids since hospitalization in July '14.  Today, pt able to masticate soft solids adequately.  Consumed nectar-thick liquids with no s/s of aspiration.  Hired caregivers present.  Recommend liberalizing diet to dysphagia 3 with nectar-thick liquids.  Discussed recommendations with caregivers, who know pt welland have been preparing and feeding her all her meals for years.  Discussed recs with pt's daughter via phone as well.  No further SLP f/u warranted.      Aspiration Risk  Mild    Diet Recommendation Dysphagia 3 (Mechanical Soft);Nectar-thick liquid   Liquid Administration via: Cup Medication Administration: Crushed with puree Supervision: Full  supervision/cueing for compensatory strategies Compensations: Slow rate;Small sips/bites Postural Changes and/or Swallow Maneuvers: Seated upright 90 degrees    Other  Recommendations Oral Care Recommendations: Oral care QID Other Recommendations: Order thickener from pharmacy   Follow Up Recommendations  None    Frequency and Duration            SLP Swallow Goals     Swallow Study Prior Functional Status       General Date of Onset:  (chronic) HPI: 79 y.o. female admitted with hypoxia, respiratory distress, SEMI. Hx of HTN, PAF (on coumadin), COPD, and no known h/o CAD; has pacemaker.   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  CXR: Cardiomegaly with bilateral pleural effusions and right worse than left airspace disease  Pt currently on dys 1 diet. Pt totally dependent for care PTA.  Potential for D/C home today.   Type of Study: Bedside swallow evaluation Previous Swallow Assessment: MBS 2009 - records not accessible Diet Prior to this Study: Dysphagia 1 (puree);Thin liquids Temperature Spikes Noted: No Respiratory Status: Supplemental O2 delivered via (comment) (nasal cannula) History of Recent Intubation: No Behavior/Cognition: Alert Oral Cavity - Dentition: Adequate natural dentition Self-Feeding Abilities: Needs assist Patient Positioning: Upright in bed Baseline Vocal Quality: Low vocal intensity Volitional Cough: Cognitively unable to elicit Volitional Swallow: Unable to elicit    Oral/Motor/Sensory Function Overall Oral Motor/Sensory Function: Appears within functional limits for tasks assessed   Ice Chips Ice chips: Not tested   Thin Liquid Thin Liquid: Not  tested    Nectar Thick Nectar Thick Liquid: Within functional limits Presentation: Cup   Honey Thick Honey Thick Liquid: Within functional limits Presentation: Cup   Puree Puree: Within functional limits    Solid   Dawn Barrett L. Dawn Barrett, [place] CCC/SLP Pager [phone]     Solid: Impaired Oral Phase Impairments:  (prolonged mastication)       Dawn Barrett 11/18/2012,10:37 AM

## 2012-11-18 NOTE — Progress Notes (Signed)
ANTICOAGULATION CONSULT NOTE - Follow Up Consult  Pharmacy Consult for Coumadin Indication: atrial fibrillation  Allergies  Allergen Reactions  . Codeine Other (See Comments)    REACTION: unknown  . Sulfonamide Derivatives Other (See Comments)    REACTION:  unknown    Patient Measurements: Height: 5\' 5"  (165.1 cm) Weight: 127 lb 10.3 oz (57.9 kg) IBW/kg (Calculated) : 57  Vital Signs: Temp: 99.2 F (37.3 C) (09/23 0800) Temp src: Oral (09/23 0800) BP: 125/44 mmHg (09/23 0800) Pulse Rate: 69 (09/23 0800)  Labs:  Recent Labs  11/16/12 0430 11/17/12 0650 11/18/12 0458  HGB  --  10.4*  --   HCT  --  31.2*  --   PLT  --  135*  --   LABPROT 33.4* 27.1* 25.5*  INR 3.44* 2.62* 2.42*  CREATININE 0.47*  --   --     Estimated Creatinine Clearance: 40.4 ml/min (by C-G formula based on Cr of 0.47).  Assessment: Home Coumadin of 1mg  dose given on 9/19, but INR rose from 2.14 to 3.47. Coumadin held 9/20. INR still 2.42 today.  Cipro added 9/19, which can effect Coumadin dosing, but likely more significantly effected by decreased PO intake.   Daughter relates sensitivity to small Coumadin dose changes, as well as antibiotics.  Home Coumadin regimen: 1 mg MTuWFSat, 0.5 mg ThuSun (uses 2 mg tablets).  Amiodarone continues, also on Cipro    Goal of Therapy:  INR 2-3 Monitor platelets by anticoagulation protocol: Yes   Plan:  Coumadin 1 mg po x 1 today Continue daily PT/INR.  Thank you. Okey Regal, PharmD [phone]  11/18/2012,9:01 AM

## 2012-11-19 ENCOUNTER — Inpatient Hospital Stay (HOSPITAL_COMMUNITY): Payer: Medicare Other

## 2012-11-19 DIAGNOSIS — I509 Heart failure, unspecified: Secondary | ICD-10-CM

## 2012-11-19 DIAGNOSIS — I214 Non-ST elevation (NSTEMI) myocardial infarction: Secondary | ICD-10-CM

## 2012-11-19 DIAGNOSIS — Z7901 Long term (current) use of anticoagulants: Secondary | ICD-10-CM

## 2012-11-19 DIAGNOSIS — I5041 Acute combined systolic (congestive) and diastolic (congestive) heart failure: Secondary | ICD-10-CM

## 2012-11-19 LAB — GLUCOSE, CAPILLARY
Glucose-Capillary: 120 mg/dL — ABNORMAL HIGH (ref 70–99)
Glucose-Capillary: 129 mg/dL — ABNORMAL HIGH (ref 70–99)

## 2012-11-19 LAB — CBC WITH DIFFERENTIAL/PLATELET
Basophils Absolute: 0 10*3/uL (ref 0.0–0.1)
Basophils Relative: 0 % (ref 0–1)
Eosinophils Relative: 1 % (ref 0–5)
HCT: 37.7 % (ref 36.0–46.0)
Hemoglobin: 12.2 g/dL (ref 12.0–15.0)
Lymphocytes Relative: 22 % (ref 12–46)
MCHC: 32.4 g/dL (ref 30.0–36.0)
MCV: 96.9 fL (ref 78.0–100.0)
Monocytes Absolute: 0.5 10*3/uL (ref 0.1–1.0)
Monocytes Relative: 9 % (ref 3–12)
Platelets: 120 10*3/uL — ABNORMAL LOW (ref 150–400)
RDW: 15.4 % (ref 11.5–15.5)
WBC: 6.3 10*3/uL (ref 4.0–10.5)

## 2012-11-19 MED ORDER — BOOST / RESOURCE BREEZE PO LIQD
1.0000 | Freq: Every day | ORAL | Status: DC
  Administered 2012-11-19 – 2012-11-20 (×2): 1 via ORAL

## 2012-11-19 MED ORDER — SODIUM CHLORIDE 0.9 % IV SOLN
3.0000 g | Freq: Three times a day (TID) | INTRAVENOUS | Status: DC
  Administered 2012-11-19 – 2012-11-21 (×7): 3 g via INTRAVENOUS
  Filled 2012-11-19 (×9): qty 3

## 2012-11-19 MED ORDER — WARFARIN SODIUM 1 MG PO TABS
1.0000 mg | ORAL_TABLET | Freq: Once | ORAL | Status: AC
  Administered 2012-11-19: 1 mg via ORAL
  Filled 2012-11-19: qty 1

## 2012-11-19 NOTE — Progress Notes (Signed)
ANTICOAGULATION CONSULT NOTE - Follow Up Consult  Pharmacy Consult for Coumadin Indication: atrial fibrillation  Allergies  Allergen Reactions  . Codeine Other (See Comments)    REACTION: unknown  . Sulfonamide Derivatives Other (See Comments)    REACTION:  unknown    Patient Measurements: Height: 5\' 5"  (165.1 cm) Weight: 135 lb 5.8 oz (61.4 kg) IBW/kg (Calculated) : 57  Vital Signs: Temp: 98.6 F (37 C) (09/24 0758) Temp src: Oral (09/24 0758) BP: 119/84 mmHg (09/24 0826) Pulse Rate: 68 (09/24 0826)  Labs:  Recent Labs  11/17/12 0650 11/18/12 0458 11/19/12 0740  HGB 10.4*  --  12.2  HCT 31.2*  --  37.7  PLT 135*  --  120*  LABPROT 27.1* 25.5* 20.4*  INR 2.62* 2.42* 1.80*    Estimated Creatinine Clearance: 40.4 ml/min (by C-G formula based on Cr of 0.47).  Assessment: Home Coumadin of 1mg  dose given on 9/19, but INR rose from 2.14 to 3.47. Coumadin held 9/20.  Cipro added 9/19, which can effect Coumadin dosing, but likely more significantly effected by decreased PO intake.   Daughter relates sensitivity to small Coumadin dose changes, as well as antibiotics.  Home Coumadin regimen: 1 mg MTuWFSat, 0.5 mg ThuSun (uses 2 mg tablets).  Amiodarone continues, also on Cipro  INR to 1.8 today (most likely from smaller dose earlier in the week)    Goal of Therapy:  INR 2-3 Monitor platelets by anticoagulation protocol: Yes   Plan:  Coumadin 1 mg po x 1 today Continue daily PT/INR.  Thank you. Okey Regal, PharmD [phone]  11/19/2012,9:30 AM

## 2012-11-19 NOTE — Progress Notes (Signed)
Pt with weak congested cough, thick yellow sputum noted. Low grade temp. Dr. Verdie Mosher on floor and made aware. New orders obtained. Will monitor.

## 2012-11-19 NOTE — Clinical Documentation Improvement (Signed)
THIS DOCUMENT IS NOT A PERMANENT PART OF THE MEDICAL RECORD  Please update your documentation with the medical record to reflect your response to this query. If you need asistance, please call [phone]  11/19/12  Dear Associates,  In a better effort to capture your patient's severity of illness, reflect appropriate length of stay and utilization of resources, a review of the patient medical record has revealed the following indicators.  Based on your clinical judgment, please clarify and document in a progress note and/or discharge summary the clinical condition associated with the following supporting information: In responding to this query please exercise your independent judgment.  The fact that a query is asked, does not imply that any particular answer is desired or expected.  Possible Clinical Conditions?  Acute Respiratory Failure  Acute on Chronic Respiratory Failure  Chronic Respiratory Failure  Other Condition  Cannot Clinically Determine   Supporting Information:   Risk Factors: CHF, O2 dependent  Signs&Symptoms: She's been receiving albuterol treatments at home.She had bilateral wheezing and was tight.Tachypnea noted, poor air movement bilaterally with expiratory wheezing and crackles at the bases She was placed on a DuoNeb.They report the patient had acute drop in her oxygen saturations this morning into the 80s.He placed her on supplemental oxygen and gave her a breathing treatment. Tachypnea Shortness of Breath. This is a 79 year-old female with a history of CVA, hypertension, and atrial fibrillation currently on Coumadin who presents with shortness of breath.  Given recent history of bronchitis and improvement with albuterol will also given neb and steroids to see if any relief. Hypoxic on RA. Wheezing and mid edema on exam.Pt presents with acute SOB. Shortness of Breath. RR: 32;   Diagnostics: Wall thickness was increased in a pattern of mild LVH. There was  moderate focal basal hypertrophy of the septum. Systolic function was mildly to moderately reduced. The estimated ejection fraction was in the range of 40% to 45%. There is akinesis of the apical myocardium. Doppler parameters are consistent with abnormal left ventricular relaxation (grade 1 diastolic dysfunction). Doppler parameters are consistent with high ventricular filling pressure.   Lab: pro BNP: 3007.0  Radiology: Marked cardiomegaly with sequential pacemaker in place.  Asymmetric airspace disease with bilateral pleural effusions may  represent pulmonary edema although infectious infiltrate cannot be  excluded in proper clinical setting.  Respiratory Treatment: xopenex  Treatment: O2 at 4 L/min per nasal cannula                  You may use possible, probable, or suspect with inpatient documentation. possible, probable, suspected diagnoses MUST be documented at the time of discharge  Reviewed:  no additional documentation provided I am not clear on the exact definitions of the above conditions. She has heart failure, documented on the problem list.  Thank You,  Amada Kingfisher RN, BSN, CCM  Clinical Documentation Specialist: Health Information Management/[email or web address] [phone]

## 2012-11-19 NOTE — Progress Notes (Signed)
INITIAL NUTRITION ASSESSMENT  DOCUMENTATION CODES Per approved criteria  -Not Applicable   INTERVENTION:  Resource Breeze daily (250 kcals, 9 gm protein per 8 fl oz carton) RD to follow for nutrition care plan  NUTRITION DIAGNOSIS: Increased nutrient needs related to skin intergrity as evidenced by estimated nutrition needs  Goal: Pt to meet >/= 90% of their estimated nutrition needs   Monitor:  PO & supplemental intake, weight, labs, I/O's  Reason for Assessment: Low Braden  79 y.o. female  Admitting Dx: Acute combined systolic and diastolic CHF, NYHA class 4  ASSESSMENT: Patient with PMH of HTN, PAF (on coumadin), stroke and COPD; presented to ED with severe SOB and respiratory distress; per EMS, patient was hypertensive with SBP in the 180s and had bilateral wheezing.   RD spoke with patient's daughter at bedside; patient eating a banana upon visit; s/p bedside swallow evaluation 9/23 ---> patient at risk for skin breakdown given low braden score; would benefit from addition of supplements; patient doesn't care for anything "milky," however daughter agreeable to [employer] ---> RD to order.  Height: Ht Readings from Last 1 Encounters:  11/14/12 5\' 5"  (1.651 m)    Weight: Wt Readings from Last 1 Encounters:  11/19/12 135 lb 5.8 oz (61.4 kg)    Ideal Body Weight: 125 lb  % Ideal Body Weight: 108%  Wt Readings from Last 10 Encounters:  11/19/12 135 lb 5.8 oz (61.4 kg)  08/05/12 120 lb 12.8 oz (54.795 kg)  02/09/10 130 lb (58.968 kg)  06/28/09 134 lb (60.782 kg)  11/23/08 135 lb (61.236 kg)    Usual Body Weight: 120 lb  % Usual Body Weight: 112%  BMI:  Body mass index is 22.53 kg/(m^2).  Estimated Nutritional Needs: Kcal: 1400-1600 Protein: 55-65 gm Fluid: >/= 1.5 L  Skin: Intact  Diet Order: Dysphagia 3, nectar-thick liquids  EDUCATION NEEDS: -No education needs identified at this time   Intake/Output Summary (Last 24 hours) at 11/19/12  1058 Last data filed at 11/19/12 1051  Gross per 24 hour  Intake    343 ml  Output      0 ml  Net    343 ml    Labs:   Recent Labs Lab 11/14/12 0921 11/15/12 0200 11/16/12 0430  NA 132* 136 133*  K 4.0 3.7 3.4*  CL 98 101 96  CO2 25 28 28   BUN 16 21 20   CREATININE 0.47* 0.48* 0.47*  CALCIUM 8.5 8.1* 8.5  GLUCOSE 179* 157* 126*    CBG (last 3)   Recent Labs  11/19/12 0800  GLUCAP 120*    Scheduled Meds: . amiodarone  400 mg Oral TID  . ampicillin-sulbactam (UNASYN) IV  3 g Intravenous Q8H  . antiseptic oral rinse  15 mL Mouth Rinse BID  . carvedilol  3.125 mg Oral BID WC  . cholecalciferol  2,000 Units Oral Daily  . ciprofloxacin  250 mg Oral BID  . levalbuterol  1.25 mg Nebulization Q6H  . sodium chloride  3 mL Intravenous Q12H  . vitamin B-12  1,000 mcg Oral Daily  . vitamin C  500 mg Oral Daily  . warfarin  1 mg Oral ONCE-1800  . Warfarin - Pharmacist Dosing Inpatient   Does not apply q1800    Continuous Infusions:   Past Medical History  Diagnosis Date  . Stroke   . Hypertension   . COPD (chronic obstructive pulmonary disease)   . Paroxysmal atrial fibrillation     Past Surgical History  Procedure Laterality Date  . Pacemaker insertion  11/19/2007    St. Jude Zephyr 5020 pulse generator, serial [id number]  . Femur surgery Right 06/2011    Rod inserted after a fall.     Maureen Chatters, RD, LDN Pager #: [phone] After-Hours Pager #: [phone]

## 2012-11-19 NOTE — Progress Notes (Signed)
PT Cancellation Note  Patient Details Name: MAIJA BIGGERS MRN: [id number] DOB: [birth year]   Cancelled Treatment:     Pt's daughter present & pt sitting up in recliner upon arrival.  Pt's daughter requesting therapy return tomorrow due to they (family) has already got pt OOB>recliner.     Verdell Face, [place] [phone] 11/19/2012

## 2012-11-19 NOTE — Progress Notes (Addendum)
Patient Name: Dawn Barrett Date of Encounter: 11/19/2012     Principal Problem:   Acute combined systolic and diastolic CHF, NYHA class 4 Active Problems:   Atrial fibrillation   Pacemaker-St.Jude   SEMI (subendocardial myocardial infarction), initial episode of care    SUBJECTIVE  The patients daughter states that she has been tired since the amiodarone been increased. She denies chest pain.   CURRENT MEDS . amiodarone  400 mg Oral TID  . ampicillin-sulbactam (UNASYN) IV  3 g Intravenous Q8H  . antiseptic oral rinse  15 mL Mouth Rinse BID  . carvedilol  3.125 mg Oral BID WC  . cholecalciferol  2,000 Units Oral Daily  . ciprofloxacin  250 mg Oral BID  . levalbuterol  1.25 mg Nebulization Q6H  . sodium chloride  3 mL Intravenous Q12H  . vitamin B-12  1,000 mcg Oral Daily  . vitamin C  500 mg Oral Daily  . Warfarin - Pharmacist Dosing Inpatient   Does not apply q1800    OBJECTIVE  Filed Vitals:   11/19/12 0400 11/19/12 0700 11/19/12 0758 11/19/12 0800  BP: 112/50 95/38  119/84  Pulse: 66 64  65  Temp: 98.9 F (37.2 C)  98.6 F (37 C)   TempSrc: Oral  Oral   Resp: 17 11  28   Height:      Weight:  135 lb 5.8 oz (61.4 kg)    SpO2: 99% 97%  95%    Intake/Output Summary (Last 24 hours) at 11/19/12 0826 Last data filed at 11/19/12 0500  Gross per 24 hour  Intake    343 ml  Output      0 ml  Net    343 ml   Filed Weights   11/17/12 0500 11/18/12 0500 11/19/12 0700  Weight: 125 lb 10.6 oz (57 kg) 127 lb 10.3 oz (57.9 kg) 135 lb 5.8 oz (61.4 kg)    PHYSICAL EXAM  General: Pleasant, NAD. Neuro: Alert and oriented X 3. Non-verbal. Moves all extremities spontaneously. Psych: Normal affect. HEENT:  Normal  Neck: Supple without bruits or JVD. Lungs:  Decreased breathing sounds at both bases. Heart: RRR no s3, s4, or murmurs. Abdomen: Soft, non-tender, non-distended, BS + x 4.  Extremities: No clubbing, cyanosis or edema. DP/PT/Radials 2+ and equal  bilaterally.  Accessory Clinical Findings  CBC  Recent Labs  11/17/12 0650  WBC 8.1  HGB 10.4*  HCT 31.2*  MCV 96.3  PLT 135*   TELE  SR since 9/23 7 pm   Radiology/Studies  Dg Chest Port 1 View  11/19/2012   CLINICAL DATA:  Productive cough and fever  EXAM: PORTABLE CHEST - 1 VIEW  COMPARISON:  Chest x-ray 2 days prior  FINDINGS: Left approach dual-chamber pacer shows no interval displacement.  Unchanged cardiomegaly. Mediastinal contours are distorted by right rotation.  Moderate bilateral pleural effusions. Interstitial coarsening diffusely with asymmetric right perihilar opacity. Lung aeration is unchanged from 2 days prior.  IMPRESSION: 1. Asymmetric right-sided opacity which could represent pneumonia, unchanged from 2 days prior. 2. Moderate pleural effusions with mild pulmonary edema.   Electronically Signed   By: Tiburcio Pea   On: 11/19/2012 03:05    ASSESSMENT AND PLAN  1. SEMI  - by RWMA on echo and enzymes. ECG with LBBB. Conservative treatment given age and previous CVA. ASA and BB  We will add BB once her BP is more stable, currently fevers and BP in 90' 2. Acute systolic HF  - Lasix  d/c'd on 9/22 3. PAF  - on Coumadin prior to admission  - amiodarone dose increased to 400 TID for loading due to 2 episodes of A-fib in the last 2 days, now in SR 4. Pneumonia - fever, cough, started on Unasyn this am  5. Bradycardia, with PPM in place   Signed, Tobias Alexander, H MD

## 2012-11-20 DIAGNOSIS — J189 Pneumonia, unspecified organism: Secondary | ICD-10-CM

## 2012-11-20 LAB — BASIC METABOLIC PANEL
BUN: 25 mg/dL — ABNORMAL HIGH (ref 6–23)
CO2: 31 mEq/L (ref 19–32)
Calcium: 8 mg/dL — ABNORMAL LOW (ref 8.4–10.5)
Chloride: 99 mEq/L (ref 96–112)
Creatinine, Ser: 0.69 mg/dL (ref 0.50–1.10)
GFR calc Af Amer: 85 mL/min — ABNORMAL LOW (ref >90)
GFR calc non Af Amer: 73 mL/min — ABNORMAL LOW (ref >90)
Glucose, Bld: 102 mg/dL — ABNORMAL HIGH (ref 70–99)
Potassium: 3.5 mEq/L (ref 3.5–5.1)
Sodium: 135 mEq/L (ref 135–145)

## 2012-11-20 LAB — PROTIME-INR: Prothrombin Time: 23.7 seconds — ABNORMAL HIGH (ref 11.6–15.2)

## 2012-11-20 MED ORDER — AMIODARONE HCL 200 MG PO TABS
400.0000 mg | ORAL_TABLET | Freq: Two times a day (BID) | ORAL | Status: DC
  Administered 2012-11-20 – 2012-11-21 (×3): 400 mg via ORAL
  Filled 2012-11-20 (×3): qty 2

## 2012-11-20 MED ORDER — FUROSEMIDE 10 MG/ML IJ SOLN
20.0000 mg | Freq: Once | INTRAMUSCULAR | Status: AC
  Administered 2012-11-20: 20 mg via INTRAVENOUS

## 2012-11-20 MED ORDER — WARFARIN SODIUM 1 MG PO TABS
1.0000 mg | ORAL_TABLET | Freq: Once | ORAL | Status: AC
  Administered 2012-11-20: 1 mg via ORAL
  Filled 2012-11-20: qty 1

## 2012-11-20 MED ORDER — FUROSEMIDE 10 MG/ML IJ SOLN
40.0000 mg | Freq: Every day | INTRAMUSCULAR | Status: DC
  Administered 2012-11-20 – 2012-11-21 (×2): 40 mg via INTRAVENOUS
  Filled 2012-11-20 (×3): qty 4

## 2012-11-20 NOTE — Progress Notes (Signed)
Physical Therapy Treatment Patient Details Name: Dawn Barrett MRN: [id number] DOB: [birth year] Today's Date: 11/20/2012 Time: 0920-0950 PT Time Calculation (min): 30 min  PT Assessment / Plan / Recommendation  History of Present Illness Pt adm with hypoxia and SEMI.  Pt also with A-fib with RVR.   PT Comments   Pt cont's to require +2 assist for all mobility.  Daughter present entire session.  Cont to strongly recommend use of lift at home as well as instructed Nsing to use lift for bed<>recliner.     Follow Up Recommendations  Supervision/Assistance - 24 hour     Does the patient have the potential to tolerate intense rehabilitation     Barriers to Discharge        Equipment Recommendations  Other (comment) (hoyer lift)    Recommendations for Other Services    Frequency Min 2X/week   Progress towards PT Goals Progress towards PT goals: Not progressing toward goals - comment  Plan Current plan remains appropriate    Precautions / Restrictions Precautions Precautions: Fall Restrictions Weight Bearing Restrictions: No       Mobility  Bed Mobility Bed Mobility: Supine to Sit;Sitting - Scoot to Edge of Bed Supine to Sit: 1: +2 Total assist;HOB elevated;With rails Supine to Sit: Patient Percentage: 10% Sitting - Scoot to Edge of Bed: 1: +1 Total assist Details for Bed Mobility Assistance: Pt attempting to use UE's to assist but very minimally.  (A) for all components of transitional movements.  Pt with very poor trunk control sitting EOB & initially pushing backwards with hips going into extension with feet slidling forwards Transfers Transfers: Stand Pivot Transfers;Sit to Stand;Stand to Sit Sit to Stand: 1: +2 Total assist;From bed Sit to Stand: Patient Percentage: 20% Stand to Sit: 1: +2 Total assist;To bed;To chair/3-in-1 Stand to Sit: Patient Percentage: 20% Stand Pivot Transfers: 1: +2 Total assist Stand Pivot Transfers: Patient Percentage: 10% Details for  Transfer Assistance: Pt unable to move feet with pivot.  (A) for all components of transfer.        PT Goals (current goals can now be found in the care plan section) Acute Rehab PT Goals Patient Stated Goal: pt nonverbal PT Goal Formulation: Patient unable to participate in goal setting Time For Goal Achievement: 11/24/12 Potential to Achieve Goals: Fair  Visit Information  Last PT Received On: 11/20/12 Assistance Needed: +2 History of Present Illness: Pt adm with hypoxia and SEMI.  Pt also with A-fib with RVR.    Subjective Data  Patient Stated Goal: pt nonverbal   Cognition  Cognition Arousal/Alertness: Awake/alert Behavior During Therapy: Flat affect Overall Cognitive Status: Difficult to assess Difficult to assess due to: Impaired communication (pt non-verbal)    Balance  Static Sitting Balance Static Sitting - Balance Support: Feet supported;Bilateral upper extremity supported Static Sitting - Level of Assistance: 1: +1 Total assist Static Sitting - Comment/# of Minutes: Pt with poor trunk control.  Pushing backwards at times with hips going into extension vs leaning fwds with very kypotic posture & requiring assist to keep from falling fwds into floor.    End of Session PT - End of Session Patient left: in chair;with call bell/phone within reach;with family/visitor present Nurse Communication: Need for lift equipment   GP     Lara Mulch 11/20/2012, 3:04 PM  Verdell Face, PTA [phone] 11/20/2012

## 2012-11-20 NOTE — Progress Notes (Addendum)
Patient Name: Dawn Barrett Date of Encounter: 11/20/2012   Principal Problem:   Acute combined systolic and diastolic CHF, NYHA class 4 Active Problems:   Atrial fibrillation   Pacemaker-St.Jude   SEMI (subendocardial myocardial infarction), initial episode of care   SUBJECTIVE  The patient feels SOB.   CURRENT MEDS . amiodarone  400 mg Oral TID  . ampicillin-sulbactam (UNASYN) IV  3 g Intravenous Q8H  . antiseptic oral rinse  15 mL Mouth Rinse BID  . carvedilol  3.125 mg Oral BID WC  . cholecalciferol  2,000 Units Oral Daily  . ciprofloxacin  250 mg Oral BID  . feeding supplement  1 Container Oral QPC lunch  . levalbuterol  1.25 mg Nebulization Q6H  . sodium chloride  3 mL Intravenous Q12H  . vitamin B-12  1,000 mcg Oral Daily  . vitamin C  500 mg Oral Daily  . Warfarin - Pharmacist Dosing Inpatient   Does not apply q1800    OBJECTIVE  Filed Vitals:   11/20/12 0505 11/20/12 0600 11/20/12 0604 11/20/12 0700  BP: 132/41 81/32 140/46 99/38  Pulse: 59 58 60 59  Temp:      TempSrc:      Resp: 20 20 15 26   Height:      Weight:      SpO2: 100% 99% 99% 98%    Intake/Output Summary (Last 24 hours) at 11/20/12 0756 Last data filed at 11/20/12 0507  Gross per 24 hour  Intake    643 ml  Output      0 ml  Net    643 ml   Filed Weights   11/18/12 0500 11/19/12 0700 11/20/12 0500  Weight: 127 lb 10.3 oz (57.9 kg) 135 lb 5.8 oz (61.4 kg) 129 lb 10.1 oz (58.8 kg)    PHYSICAL EXAM  General: Pleasant, NAD. Neuro: Alert and oriented X 3. Non-verbal. Moves all extremities spontaneously. Psych: Normal affect. HEENT:  Normal  Neck: Supple without bruits or JVD. Lungs:  Decreased breathing sounds at both bases. + wheezing B/L Heart: RRR no s3, s4, or murmurs. Abdomen: Soft, non-tender, non-distended, BS + x 4.  Extremities: No clubbing, cyanosis, edema of both LE. DP/PT/Radials 2+ and equal bilaterally.  Accessory Clinical Findings  CBC  Recent Labs   11/19/12 0740  WBC 6.3  NEUTROABS 4.3  HGB 12.2  HCT 37.7  MCV 96.9  PLT 120*   TELE  SR since 9/23    Radiology/Studies  Dg Chest Port 1 View  11/19/2012   CLINICAL DATA:  Productive cough and fever  EXAM: PORTABLE CHEST - 1 VIEW  COMPARISON:  Chest x-ray 2 days prior  FINDINGS: Left approach dual-chamber pacer shows no interval displacement.  Unchanged cardiomegaly. Mediastinal contours are distorted by right rotation.  Moderate bilateral pleural effusions. Interstitial coarsening diffusely with asymmetric right perihilar opacity. Lung aeration is unchanged from 2 days prior.  IMPRESSION: 1. Asymmetric right-sided opacity which could represent pneumonia, unchanged from 2 days prior. 2. Moderate pleural effusions with mild pulmonary edema.   Electronically Signed   By: Tiburcio Pea   On: 11/19/2012 03:05    ASSESSMENT AND PLAN  1. SEMI  - by RWMA on echo and enzymes. ECG with LBBB. Conservative treatment given age and previous CVA.  Carvedilol 3.125 mg was tried yesterday with hypotensive response (81/32) once her BP is more stable, currently fevers and BP in 90'  2. Acute systolic HF, now fluid overloaded after Lasix d/c'd on 9/22 for concern of  pneumonia. - Start Lasix 40 mg IV now and 20 mg at 2 pm   3. PAF  - on Coumadin prior to admission  - amiodarone dose increased to 400 TID for loading due to 2 episodes of A-fib in the last 2 days, now in SR, decrease to 400 mg PO BID  4. Pneumonia - fever, cough, started on Unasyn on 11/19/12.   5. Bradycardia, with PPM in place   Signed, Tobias Alexander, H MD

## 2012-11-20 NOTE — Progress Notes (Signed)
ANTICOAGULATION CONSULT NOTE - Follow Up Consult  Pharmacy Consult for Coumadin Indication: atrial fibrillation  Allergies  Allergen Reactions  . Codeine Other (See Comments)    REACTION: unknown  . Sulfonamide Derivatives Other (See Comments)    REACTION:  unknown    Patient Measurements: Height: 5\' 5"  (165.1 cm) Weight: 129 lb 10.1 oz (58.8 kg) IBW/kg (Calculated) : 57  Vital Signs: Temp: 98.2 F (36.8 C) (09/25 0800) Temp src: Oral (09/25 0800) BP: 158/60 mmHg (09/25 0800) Pulse Rate: 64 (09/25 0820)  Labs:  Recent Labs  11/18/12 0458 11/19/12 0740 11/20/12 0801  HGB  --  12.2  --   HCT  --  37.7  --   PLT  --  120*  --   LABPROT 25.5* 20.4* 23.7*  INR 2.42* 1.80* 2.20*  CREATININE  --   --  0.69    Estimated Creatinine Clearance: 40.4 ml/min (by C-G formula based on Cr of 0.69).  Assessment: Home Coumadin of 1mg  dose given on 9/19, but INR rose from 2.14 to 3.47. Coumadin held 9/20.  Cipro added 9/19, which can effect Coumadin dosing, but likely more significantly effected by decreased PO intake.   Daughter relates sensitivity to small Coumadin dose changes, as well as antibiotics.  Home Coumadin regimen: 1 mg MTuWFSat, 0.5 mg ThuSun (uses 2 mg tablets).  Amiodarone continues, also on Cipro  INR to 2.2 today     Goal of Therapy:  INR 2-3 Monitor platelets by anticoagulation protocol: Yes   Plan:  Coumadin 1 mg po x 1 today Continue daily PT/INR.  Thank you. Okey Regal, PharmD [phone]  11/20/2012,10:22 AM

## 2012-11-21 LAB — PROTIME-INR
INR: 2.33 — ABNORMAL HIGH (ref 0.00–1.49)
Prothrombin Time: 24.8 seconds — ABNORMAL HIGH (ref 11.6–15.2)

## 2012-11-21 MED ORDER — POTASSIUM CHLORIDE 20 MEQ/15ML (10%) PO LIQD
40.0000 meq | Freq: Once | ORAL | Status: AC
  Administered 2012-11-21: 40 meq via ORAL
  Filled 2012-11-21: qty 30

## 2012-11-21 MED ORDER — LEVALBUTEROL HCL 0.63 MG/3ML IN NEBU: 1.0000 | INHALATION_SOLUTION | Freq: Four times a day (QID) | RESPIRATORY_TRACT | Status: AC | PRN

## 2012-11-21 MED ORDER — WARFARIN SODIUM 1 MG PO TABS
1.0000 mg | ORAL_TABLET | Freq: Once | ORAL | Status: DC
  Filled 2012-11-21: qty 1

## 2012-11-21 MED ORDER — NITROGLYCERIN 0.4 MG SL SUBL: 0.4000 mg | SUBLINGUAL_TABLET | SUBLINGUAL | Status: DC | PRN

## 2012-11-21 MED ORDER — AMOXICILLIN-POT CLAVULANATE 500-125 MG PO TABS: 1.0000 | ORAL_TABLET | Freq: Two times a day (BID) | ORAL | Status: DC

## 2012-11-21 MED ORDER — FUROSEMIDE 40 MG PO TABS: 40.0000 mg | ORAL_TABLET | Freq: Every day | ORAL | Status: DC

## 2012-11-21 MED ORDER — POTASSIUM CHLORIDE 20 MEQ/15ML (10%) PO SOLN: 10.0000 meq | Freq: Every day | ORAL | Status: DC

## 2012-11-21 MED ORDER — AMIODARONE HCL 200 MG PO TABS: 400.0000 mg | ORAL_TABLET | Freq: Two times a day (BID) | ORAL | Status: DC

## 2012-11-21 MED ORDER — WARFARIN SODIUM 1 MG PO TABS: 1.0000 mg | ORAL_TABLET | Freq: Every day | ORAL | Status: DC

## 2012-11-21 NOTE — Progress Notes (Addendum)
Patient Name: Dawn Barrett Date of Encounter: 11/21/2012   Principal Problem:   Acute combined systolic and diastolic CHF, NYHA class 4 Active Problems:   Atrial fibrillation   Pacemaker-[employer]Jude   SEMI (subendocardial myocardial infarction), initial episode of care   Pneumonia, organism unspecified   SUBJECTIVE  The patient feels SOB.   CURRENT MEDS . amiodarone  400 mg Oral BID  . ampicillin-sulbactam (UNASYN) IV  3 g Intravenous Q8H  . antiseptic oral rinse  15 mL Mouth Rinse BID  . cholecalciferol  2,000 Units Oral Daily  . ciprofloxacin  250 mg Oral BID  . feeding supplement  1 Container Oral QPC lunch  . furosemide  40 mg Intravenous Daily  . levalbuterol  1.25 mg Nebulization Q6H  . sodium chloride  3 mL Intravenous Q12H  . vitamin B-12  1,000 mcg Oral Daily  . vitamin C  500 mg Oral Daily  . Warfarin - Pharmacist Dosing Inpatient   Does not apply q1800    OBJECTIVE  Filed Vitals:   11/21/12 0600 11/21/12 0700 11/21/12 0726 11/21/12 0749  BP:  117/62 117/62   Pulse: 67 65 65   Temp:    97.7 F (36.5 C)  TempSrc:    Oral  Resp: 25 26 22    Height:      Weight:      SpO2: 100% 99% 99%     Intake/Output Summary (Last 24 hours) at 11/21/12 0825 Last data filed at 11/21/12 4782  Gross per 24 hour  Intake    420 ml  Output      0 ml  Net    420 ml   Filed Weights   11/19/12 0700 11/20/12 0500 11/21/12 0500  Weight: 135 lb 5.8 oz (61.4 kg) 129 lb 10.1 oz (58.8 kg) 128 lb 15.5 oz (58.5 kg)    PHYSICAL EXAM  General: Pleasant, NAD. Neuro: Alert and oriented X 3. Non-verbal. Moves all extremities spontaneously. Psych: Normal affect. HEENT:  Normal  Neck: Supple without bruits or JVD. Lungs:  Decreased breathing sounds at both bases. + wheezing B/L Heart: RRR no s3, s4, or murmurs. Abdomen: Soft, non-tender, non-distended, BS + x 4.  Extremities: No clubbing, cyanosis, edema of both LE. DP/PT/Radials 2+ and equal bilaterally.  Accessory Clinical  Findings  CBC  Recent Labs  11/19/12 0740  WBC 6.3  NEUTROABS 4.3  HGB 12.2  HCT 37.7  MCV 96.9  PLT 120*   TELE  SR since 9/23    Radiology/Studies  Dg Chest Port 1 View  11/19/2012   CLINICAL DATA:  Productive cough and fever  EXAM: PORTABLE CHEST - 1 VIEW  COMPARISON:  Chest x-ray 2 days prior  FINDINGS: Left approach dual-chamber pacer shows no interval displacement.  Unchanged cardiomegaly. Mediastinal contours are distorted by right rotation.  Moderate bilateral pleural effusions. Interstitial coarsening diffusely with asymmetric right perihilar opacity. Lung aeration is unchanged from 2 days prior.  IMPRESSION: 1. Asymmetric right-sided opacity which could represent pneumonia, unchanged from 2 days prior. 2. Moderate pleural effusions with mild pulmonary edema.   Electronically Signed   By: Tiburcio Pea   On: 11/19/2012 03:05    ASSESSMENT AND PLAN  1. SEMI  - by RWMA on echo and enzymes. ECG with LBBB. Conservative treatment given age and previous CVA.  Carvedilol 3.125 mg was tried yesterday with hypotensive response (81/32) once her BP is more stable, currently fevers and BP in 90'  2. Acute systolic HF, now fluid overloaded after  Lasix d/c'd on 9/22 for concern of pneumonia. - Start Lasix 40 mg IV now and 20 mg at 2 pm with good response, send home with Lasix 40 mg daily PO QD, we will add KCL 10 mEq daily for replacement for 1 week  3. PAF  - on Coumadin prior to admission  - amiodarone dose increased to 400 TID for loading due to 2 episodes of A-fib in the last 2 days, now in SR, decrease to 400 mg PO BID for 1 week, then decrease to 400 mg PO daily  4. Pneumonia - fever, cough, started on Unasyn on 11/19/12, continue with Augmentin at home, total of 1 week  5. Bradycardia, with PPM in place  Follow up with Dr Graciela Husbands in 3 weeks.  D/C home today  Signed, Lars Masson MD

## 2012-11-21 NOTE — Progress Notes (Signed)
ANTICOAGULATION CONSULT NOTE - Follow Up Consult  Pharmacy Consult for Coumadin Indication: atrial fibrillation  Allergies  Allergen Reactions  . Codeine Other (See Comments)    REACTION: unknown  . Sulfonamide Derivatives Other (See Comments)    REACTION:  unknown    Patient Measurements: Height: 5\' 5"  (165.1 cm) Weight: 128 lb 15.5 oz (58.5 kg) IBW/kg (Calculated) : 57  Vital Signs: Temp: 97.7 F (36.5 C) (09/26 0749) Temp src: Oral (09/26 0749) BP: 117/62 mmHg (09/26 0749) Pulse Rate: 65 (09/26 0749)  Labs:  Recent Labs  11/19/12 0740 11/20/12 0801 11/21/12 0540  HGB 12.2  --   --   HCT 37.7  --   --   PLT 120*  --   --   LABPROT 20.4* 23.7* 24.8*  INR 1.80* 2.20* 2.33*  CREATININE  --  0.69  --     Estimated Creatinine Clearance: 40.4 ml/min (by C-G formula based on Cr of 0.69).  Assessment: Home Coumadin of 1mg  dose given on 9/19, but INR rose from 2.14 to 3.47. Coumadin held 9/20.  Cipro added 9/19, which can effect Coumadin dosing, but likely more significantly effected by decreased PO intake.   Daughter relates sensitivity to small Coumadin dose changes, as well as antibiotics.  Home Coumadin regimen: 1 mg MTuWFSat, 0.5 mg ThuSun (uses 2 mg tablets).  Amiodarone continues, also on Cipro  INR to 2.33 today  - with plans to discharge home     Goal of Therapy:  INR 2-3 Monitor platelets by anticoagulation protocol: Yes   Plan:  Resume home dose at discharge 1 mg MTuWFSat, 0.5 mg ThuSun Daily INR  Thank you. Okey Regal, PharmD [phone]  11/21/2012,11:07 AM

## 2012-11-24 ENCOUNTER — Telehealth: Payer: Self-pay | Admitting: Cardiology

## 2012-11-24 ENCOUNTER — Other Ambulatory Visit: Payer: Self-pay | Admitting: Internal Medicine

## 2012-11-24 ENCOUNTER — Telehealth: Payer: Self-pay | Admitting: Physician Assistant

## 2012-11-24 ENCOUNTER — Other Ambulatory Visit: Payer: Self-pay | Admitting: Physician Assistant

## 2012-11-24 DIAGNOSIS — R531 Weakness: Secondary | ICD-10-CM

## 2012-11-24 LAB — CBC
HCT: 31 % — ABNORMAL LOW (ref 36.0–46.0)
Hemoglobin: 10.1 g/dL — ABNORMAL LOW (ref 12.0–15.0)
MCH: 31.7 pg (ref 26.0–34.0)
MCV: 97.2 fL (ref 78.0–100.0)
RBC: 3.19 MIL/uL — ABNORMAL LOW (ref 3.87–5.11)
RDW: 15.6 % — ABNORMAL HIGH (ref 11.5–15.5)

## 2012-11-24 LAB — BASIC METABOLIC PANEL
BUN: 33 mg/dL — ABNORMAL HIGH (ref 6–23)
CO2: 31 mEq/L (ref 19–32)
Calcium: 8.2 mg/dL — ABNORMAL LOW (ref 8.4–10.5)
Creat: 0.95 mg/dL (ref 0.50–1.10)
Glucose, Bld: 181 mg/dL — ABNORMAL HIGH (ref 70–99)
Sodium: 143 mEq/L (ref 135–145)

## 2012-11-24 NOTE — Telephone Encounter (Signed)
Spoke with sister Kendal Hymen regarding lab results. Labs do not explain completely the patient's increased somnolence but do demonstrate hypokalemia and though the Cr is normal is has doubled from previous. Pt not taking PO intake. Family understandably does not wish to bring the patient to the ER. Recommended holding the lasix and give one dose of the KCl. FYI to Dr. Graciela Husbands.

## 2012-11-24 NOTE — Telephone Encounter (Signed)
Pt daughter called.  Her mother is lethargic and she is concerned. Nurse is present, SBP 118, HR  68, oxygen level 98%. Respirations are not labored.  Her PO intake has been poor. With the lethargy, they have not been able to weigh her. They have been giving her Pedialyte with water.   Reviewed several possibilities with Ms. Obar.  Advised with normal VS, amiodarone is not responsible. Pt is not on sedating medications.  There has been no fever noted.   Concern for dehydration on the Lasix - will check BMET, Mg Concern for infection - ck CBC/diff.  INR checked today, it is 3.4, they are holding coumadin for now.  Strongly advised pt should be seen but they are reluctant to bring her to the ER, it is very hard on her.   Advised they were right to be concerned, labs were ordered as a stat.   Lab was contacted, OK for RN at the house to draw the labs. They must be labeled.   Faxed the orders in, lab to contact on-call MD with results.

## 2012-11-25 ENCOUNTER — Telehealth: Payer: Self-pay | Admitting: Internal Medicine

## 2012-11-25 LAB — CULTURE, BLOOD (ROUTINE X 2): Culture: NO GROWTH

## 2012-11-25 NOTE — Telephone Encounter (Signed)
I spoke with patient's daughter and advised what I left message for earlier. I explained why we were continuing potassium at this time and that we would address it again tomorrow at appointment with Dr. Graciela Husbands. She also asked about bringing patient on a gurney - I advised this was ok and we would help in whatever way we could once she arrives at office tomorrow. They are agreeable to plan.

## 2012-11-25 NOTE — Telephone Encounter (Signed)
New Problem  Pt has been taken off of the lasix because she was dehydrated and it lowered her potassium level. Wants to be sure that this is ok.. Please advise.

## 2012-11-25 NOTE — Telephone Encounter (Signed)
Left message to continue taking potassium, last week K+ was still low. Patient has an appointment tomorrow with Dr Graciela Husbands, I instructed them we would re-address this tomorrow at appointment. I also explained to call back with any further questions/concerns.

## 2012-11-25 NOTE — Telephone Encounter (Signed)
New Problem:  Pt's daughter states he mom was on Lasix and her K+ was 3.2   ....  Daughter states they stopped the lasix and continued the K+. Please advise

## 2012-11-26 ENCOUNTER — Encounter: Payer: Self-pay | Admitting: Internal Medicine

## 2012-11-26 ENCOUNTER — Ambulatory Visit (INDEPENDENT_AMBULATORY_CARE_PROVIDER_SITE_OTHER): Payer: Medicare Other | Admitting: Internal Medicine

## 2012-11-26 VITALS — BP 132/68 | HR 71 | Ht 65.0 in | Wt 130.0 lb

## 2012-11-26 DIAGNOSIS — I4891 Unspecified atrial fibrillation: Secondary | ICD-10-CM

## 2012-11-26 DIAGNOSIS — I5030 Unspecified diastolic (congestive) heart failure: Secondary | ICD-10-CM | POA: Insufficient documentation

## 2012-11-26 DIAGNOSIS — I5031 Acute diastolic (congestive) heart failure: Secondary | ICD-10-CM

## 2012-11-26 DIAGNOSIS — E876 Hypokalemia: Secondary | ICD-10-CM

## 2012-11-26 DIAGNOSIS — Z950 Presence of cardiac pacemaker: Secondary | ICD-10-CM

## 2012-11-26 DIAGNOSIS — I509 Heart failure, unspecified: Secondary | ICD-10-CM

## 2012-11-26 DIAGNOSIS — Z7901 Long term (current) use of anticoagulants: Secondary | ICD-10-CM

## 2012-11-26 DIAGNOSIS — R0602 Shortness of breath: Secondary | ICD-10-CM

## 2012-11-26 DIAGNOSIS — R531 Weakness: Secondary | ICD-10-CM | POA: Insufficient documentation

## 2012-11-26 DIAGNOSIS — I5041 Acute combined systolic (congestive) and diastolic (congestive) heart failure: Secondary | ICD-10-CM

## 2012-11-26 NOTE — Assessment & Plan Note (Signed)
Last hemoglobin was mildly depressed we will recheck it

## 2012-11-26 NOTE — Progress Notes (Signed)
Patient Care Team: Provider Default, MD as PCP - General (Orthopedic Surgery)   HPI  Dawn Barrett is a 79 y.o. female Seen following a recent hospitalization for acute heart failure accompanied by positive troponin. Echocardiogram demonstrated EF of 40-45% she was also noted to have paroxysmal atrial fibrillation for which she was started back on amiodarone.  She was ultimately discharged on Lasix and Augmentin. Over the next 48 hours she became increasingly lethargic blood work obtained on Monday demonstrated an elevated BUN and low potassium and her Lasix was discontinued. Antibiotics are to be continued for 2 more days.  Past Medical History  Diagnosis Date  . Stroke   . Hypertension   . COPD (chronic obstructive pulmonary disease)   . Paroxysmal atrial fibrillation     Past Surgical History  Procedure Laterality Date  . Pacemaker insertion  11/19/2007    [employer] Zephyr 5020 pulse generator, serial [id number]  . Femur surgery Right 06/2011    Rod inserted after a fall.     Current Outpatient Prescriptions  Medication Sig Dispense Refill  . amiodarone (PACERONE) 200 MG tablet Take 2 tablets (400 mg total) by mouth 2 (two) times daily. 2 tabs 2 x day for 1 week, then 2 tabs daily.  75 tablet  11  . amoxicillin-clavulanate (AUGMENTIN) 500-125 MG per tablet Take 1 tablet (500 mg total) by mouth 2 (two) times daily.  14 tablet  0  . cholecalciferol (VITAMIN D) 1000 UNITS tablet Take 2,000 Units by mouth daily.      . Cyanocobalamin 1000 MCG/15ML LIQD Take 1,000 mcg by mouth daily.      Marland Kitchen levalbuterol (XOPENEX) 0.63 MG/3ML nebulizer solution Take 3 mLs (0.63 mg total) by nebulization every 6 (six) hours as needed for wheezing.  3 mL  30  . nitroGLYCERIN (NITROSTAT) 0.4 MG SL tablet Place 1 tablet (0.4 mg total) under the tongue every 5 (five) minutes x 3 doses as needed for chest pain.  25 tablet  12  . nystatin (MYCOSTATIN/NYSTOP) 100000 UNIT/GM POWD Apply 1 g topically daily as  needed (to affected area).      . potassium chloride 20 MEQ/15ML (10%) SOLN Take 7.5 mLs (10 mEq total) by mouth daily. Take daily for 1 week, then as directed.  120 mL  0  . vitamin C (ASCORBIC ACID) 500 MG tablet Take 500 mg by mouth daily.      Marland Kitchen warfarin (COUMADIN) 1 MG tablet Take 1 tablet (1 mg total) by mouth daily at 6 PM. 1 mg Mon-Tues-Wed-Fri-Sat; 0.5 mg Thursday and Sunday  33 tablet  11  . furosemide (LASIX) 40 MG tablet Take 40 mg by mouth daily.       No current facility-administered medications for this visit.    Allergies  Allergen Reactions  . Codeine Other (See Comments)    REACTION: unknown  . Sulfonamide Derivatives Other (See Comments)    REACTION:  unknown    Review of Systems negative except from HPI and PMH  Physical Exam BP 132/68  Pulse 71  Ht 5\' 5"  (1.651 m)  Wt 130 lb (58.968 kg)  BMI 21.63 kg/m2 Well developed and well nourished in no acute distress HENT normal E scleral and icterus clear Neck Supple  Basilar crackles Regular rate and rhythm, no murmurs gallops or rub Soft with active bowel sounds No clubbing cyanosis none Edema Alert and oriented, grossly normal motor and sensory function Skin Warm and Dry  ECG demonstrates atrial pacing mostly sinus rhythm  with left bundle branch block   Assessment and  Plan

## 2012-11-26 NOTE — Assessment & Plan Note (Signed)
I don't see she can tolerate a diuretic. Especially not the dose prescribed. We'll discontinue his right now and see how she does. I think probably the atrial fibrillation triggered heart failure if we can maintain sinus rhythm with higher doses of amiodarone hopefully we will avoid it. It is also possible that low-grade infection was part of the process initially

## 2012-11-26 NOTE — Assessment & Plan Note (Signed)
Patient had weakness following discharge associated with Lasix. I suspect that she was overdiuresis based on her laboratories. Lasix has been stopped. We'll stop her potassium also is was 3.2 a diuretic has been discontinued. We will recheck metabolic profile today as well as a BNP which was 3000 and hospital.

## 2012-11-26 NOTE — Assessment & Plan Note (Signed)
Continues with paroxysms of atrial fibrillation. We'll continue her on amiodarone.

## 2012-11-26 NOTE — Patient Instructions (Addendum)
Your physician recommends that you return for lab work today: INR/BMP/BNP/CBC

## 2012-11-27 ENCOUNTER — Ambulatory Visit (INDEPENDENT_AMBULATORY_CARE_PROVIDER_SITE_OTHER): Payer: Medicare Other | Admitting: Pharmacist

## 2012-11-27 ENCOUNTER — Telehealth: Payer: Self-pay | Admitting: Internal Medicine

## 2012-11-27 ENCOUNTER — Telehealth: Payer: Self-pay | Admitting: Physician Assistant

## 2012-11-27 DIAGNOSIS — Z7901 Long term (current) use of anticoagulants: Secondary | ICD-10-CM

## 2012-11-27 DIAGNOSIS — I4891 Unspecified atrial fibrillation: Secondary | ICD-10-CM

## 2012-11-27 LAB — BASIC METABOLIC PANEL
BUN: 34 mg/dL — ABNORMAL HIGH (ref 6–23)
CO2: 33 mEq/L — ABNORMAL HIGH (ref 19–32)
Glucose, Bld: 124 mg/dL — ABNORMAL HIGH (ref 70–99)
Potassium: 4 mEq/L (ref 3.5–5.1)
Sodium: 138 mEq/L (ref 135–145)

## 2012-11-27 LAB — CBC WITH DIFFERENTIAL/PLATELET
Basophils Absolute: 0 10*3/uL (ref 0.0–0.1)
Basophils Relative: 0.2 % (ref 0.0–3.0)
Eosinophils Absolute: 0 10*3/uL (ref 0.0–0.7)
HCT: 32.7 % — ABNORMAL LOW (ref 36.0–46.0)
Hemoglobin: 10.7 g/dL — ABNORMAL LOW (ref 12.0–15.0)
Lymphs Abs: 1.7 10*3/uL (ref 0.7–4.0)
MCHC: 32.8 g/dL (ref 30.0–36.0)
Monocytes Absolute: 0.6 10*3/uL (ref 0.1–1.0)
Monocytes Relative: 6.1 % (ref 3.0–12.0)
Neutro Abs: 7 10*3/uL (ref 1.4–7.7)
Neutrophils Relative %: 75 % (ref 43.0–77.0)
RDW: 16.4 % — ABNORMAL HIGH (ref 11.5–14.6)

## 2012-11-27 LAB — PROTIME-INR: Prothrombin Time: 34 s — ABNORMAL HIGH (ref 10.2–12.4)

## 2012-11-27 NOTE — Telephone Encounter (Signed)
Follow up  Pt returning call about results.. Please call

## 2012-11-27 NOTE — Telephone Encounter (Signed)
Pt dtr called to f/u on her labs. She had an INR today, with a CBC, BMET and BNP yesterday.  Lab Results  Component Value Date   WBC 9.4 11/26/2012   HGB 10.7* 11/26/2012   HCT 32.7* 11/26/2012   MCV 97.0 11/26/2012   PLT 159.0 11/26/2012    Recent Labs  11/27/12  INR 2.4    Recent Labs Lab 11/26/12 1647  NA 138  K 4.0  CL 99  CO2 33*  BUN 34*  CREATININE 1.1  CALCIUM 8.6  GLUCOSE 124*   Pro B Natriuretic peptide (BNP)  Date/Time Value Range Status  11/26/2012  4:47 PM 254.0* 0.0 - 100.0 pg/mL Final  11/14/2012  9:28 AM 3007.0* 0 - 450 pg/mL Final   Reviewed labs with her. Pt is still lethargic, had CXR, results not available but NAD per dtr.   Still no fever, VS normal.   Advised her pt should resume coumadin at usual dosing tonight. Advised pt may be a little dry, not severe.   Suggestions: Give some water, not just Pedialyte Contact primary MD tomorrow and see if they can see her on Friday if she is not better.

## 2012-11-28 ENCOUNTER — Other Ambulatory Visit: Payer: Self-pay | Admitting: *Deleted

## 2012-11-28 DIAGNOSIS — I4891 Unspecified atrial fibrillation: Secondary | ICD-10-CM

## 2012-11-28 NOTE — Telephone Encounter (Signed)
I spoke with patient's daughter who stated that she got the lab results from Theodore Demark NP last night. She states that patient had an CXR yesterday that showed right side PNA, and she states that patient is aspirating. We discussed her BUN lab and Dr Odessa Fleming instruction to increase fluids - she states she will do her best but with aspiration this could be difficult. We will recheck her BMET in a week. I will pass information about recent PNA to Dr. Graciela Husbands. Patient's daughter agreeable to plan.

## 2012-12-02 ENCOUNTER — Other Ambulatory Visit: Payer: Self-pay | Admitting: *Deleted

## 2012-12-02 DIAGNOSIS — I4891 Unspecified atrial fibrillation: Secondary | ICD-10-CM

## 2012-12-03 ENCOUNTER — Telehealth: Payer: Self-pay | Admitting: Internal Medicine

## 2012-12-03 ENCOUNTER — Emergency Department (HOSPITAL_COMMUNITY): Payer: Medicare Other

## 2012-12-03 ENCOUNTER — Encounter (HOSPITAL_COMMUNITY): Payer: Self-pay | Admitting: Emergency Medicine

## 2012-12-03 ENCOUNTER — Inpatient Hospital Stay (HOSPITAL_COMMUNITY)
Admission: EM | Admit: 2012-12-03 | Discharge: 2012-12-11 | DRG: 871 | Disposition: A | Discharge status: Home Health | Payer: Medicare Other | Attending: Internal Medicine | Admitting: Internal Medicine

## 2012-12-03 DIAGNOSIS — E870 Hyperosmolality and hypernatremia: Secondary | ICD-10-CM | POA: Diagnosis present

## 2012-12-03 DIAGNOSIS — I509 Heart failure, unspecified: Secondary | ICD-10-CM | POA: Diagnosis present

## 2012-12-03 DIAGNOSIS — E860 Dehydration: Secondary | ICD-10-CM | POA: Diagnosis present

## 2012-12-03 DIAGNOSIS — I480 Paroxysmal atrial fibrillation: Secondary | ICD-10-CM

## 2012-12-03 DIAGNOSIS — Z950 Presence of cardiac pacemaker: Secondary | ICD-10-CM

## 2012-12-03 DIAGNOSIS — I252 Old myocardial infarction: Secondary | ICD-10-CM

## 2012-12-03 DIAGNOSIS — R1310 Dysphagia, unspecified: Secondary | ICD-10-CM | POA: Diagnosis present

## 2012-12-03 DIAGNOSIS — Z515 Encounter for palliative care: Secondary | ICD-10-CM

## 2012-12-03 DIAGNOSIS — I69991 Dysphagia following unspecified cerebrovascular disease: Secondary | ICD-10-CM

## 2012-12-03 DIAGNOSIS — I69920 Aphasia following unspecified cerebrovascular disease: Secondary | ICD-10-CM

## 2012-12-03 DIAGNOSIS — I421 Obstructive hypertrophic cardiomyopathy: Secondary | ICD-10-CM | POA: Diagnosis present

## 2012-12-03 DIAGNOSIS — Z66 Do not resuscitate: Secondary | ICD-10-CM | POA: Diagnosis present

## 2012-12-03 DIAGNOSIS — Z79899 Other long term (current) drug therapy: Secondary | ICD-10-CM

## 2012-12-03 DIAGNOSIS — I447 Left bundle-branch block, unspecified: Secondary | ICD-10-CM | POA: Diagnosis present

## 2012-12-03 DIAGNOSIS — A419 Sepsis, unspecified organism: Principal | ICD-10-CM | POA: Diagnosis present

## 2012-12-03 DIAGNOSIS — R627 Adult failure to thrive: Secondary | ICD-10-CM | POA: Diagnosis present

## 2012-12-03 DIAGNOSIS — T45515A Adverse effect of anticoagulants, initial encounter: Secondary | ICD-10-CM | POA: Diagnosis present

## 2012-12-03 DIAGNOSIS — G9340 Encephalopathy, unspecified: Secondary | ICD-10-CM | POA: Diagnosis present

## 2012-12-03 DIAGNOSIS — R791 Abnormal coagulation profile: Secondary | ICD-10-CM | POA: Diagnosis present

## 2012-12-03 DIAGNOSIS — J690 Pneumonitis due to inhalation of food and vomit: Secondary | ICD-10-CM | POA: Diagnosis present

## 2012-12-03 DIAGNOSIS — Z9981 Dependence on supplemental oxygen: Secondary | ICD-10-CM

## 2012-12-03 DIAGNOSIS — Z7401 Bed confinement status: Secondary | ICD-10-CM

## 2012-12-03 DIAGNOSIS — R633 Feeding difficulties, unspecified: Secondary | ICD-10-CM

## 2012-12-03 DIAGNOSIS — J441 Chronic obstructive pulmonary disease with (acute) exacerbation: Secondary | ICD-10-CM | POA: Diagnosis present

## 2012-12-03 DIAGNOSIS — Z7901 Long term (current) use of anticoagulants: Secondary | ICD-10-CM

## 2012-12-03 DIAGNOSIS — I340 Nonrheumatic mitral (valve) insufficiency: Secondary | ICD-10-CM | POA: Diagnosis present

## 2012-12-03 DIAGNOSIS — I059 Rheumatic mitral valve disease, unspecified: Secondary | ICD-10-CM | POA: Diagnosis present

## 2012-12-03 DIAGNOSIS — J189 Pneumonia, unspecified organism: Secondary | ICD-10-CM

## 2012-12-03 DIAGNOSIS — R7989 Other specified abnormal findings of blood chemistry: Secondary | ICD-10-CM | POA: Diagnosis present

## 2012-12-03 DIAGNOSIS — I10 Essential (primary) hypertension: Secondary | ICD-10-CM | POA: Diagnosis present

## 2012-12-03 DIAGNOSIS — E43 Unspecified severe protein-calorie malnutrition: Secondary | ICD-10-CM | POA: Diagnosis present

## 2012-12-03 DIAGNOSIS — H109 Unspecified conjunctivitis: Secondary | ICD-10-CM | POA: Diagnosis present

## 2012-12-03 DIAGNOSIS — J9601 Acute respiratory failure with hypoxia: Secondary | ICD-10-CM

## 2012-12-03 DIAGNOSIS — I5042 Chronic combined systolic (congestive) and diastolic (congestive) heart failure: Secondary | ICD-10-CM | POA: Diagnosis present

## 2012-12-03 DIAGNOSIS — J9620 Acute and chronic respiratory failure, unspecified whether with hypoxia or hypercapnia: Secondary | ICD-10-CM | POA: Diagnosis present

## 2012-12-03 DIAGNOSIS — I5041 Acute combined systolic (congestive) and diastolic (congestive) heart failure: Secondary | ICD-10-CM | POA: Diagnosis present

## 2012-12-03 DIAGNOSIS — J8410 Pulmonary fibrosis, unspecified: Secondary | ICD-10-CM | POA: Diagnosis present

## 2012-12-03 DIAGNOSIS — I4891 Unspecified atrial fibrillation: Secondary | ICD-10-CM | POA: Diagnosis present

## 2012-12-03 DIAGNOSIS — E876 Hypokalemia: Secondary | ICD-10-CM | POA: Diagnosis present

## 2012-12-03 DIAGNOSIS — IMO0002 Reserved for concepts with insufficient information to code with codable children: Secondary | ICD-10-CM

## 2012-12-03 DIAGNOSIS — I079 Rheumatic tricuspid valve disease, unspecified: Secondary | ICD-10-CM | POA: Diagnosis present

## 2012-12-03 DIAGNOSIS — N179 Acute kidney failure, unspecified: Secondary | ICD-10-CM | POA: Diagnosis not present

## 2012-12-03 HISTORY — DX: Chronic combined systolic (congestive) and diastolic (congestive) heart failure: I50.42

## 2012-12-03 HISTORY — DX: Nonrheumatic mitral (valve) insufficiency: I34.0

## 2012-12-03 HISTORY — DX: Left bundle-branch block, unspecified: I44.7

## 2012-12-03 HISTORY — DX: Dysphagia, unspecified: R13.10

## 2012-12-03 HISTORY — DX: Non-ST elevation (NSTEMI) myocardial infarction: I21.4

## 2012-12-03 HISTORY — DX: Rheumatic tricuspid insufficiency: I07.1

## 2012-12-03 HISTORY — DX: Pneumonia, unspecified organism: J18.9

## 2012-12-03 HISTORY — DX: Obstructive hypertrophic cardiomyopathy: I42.1

## 2012-12-03 HISTORY — DX: Bradycardia, unspecified: R00.1

## 2012-12-03 LAB — GLUCOSE, CAPILLARY: Glucose-Capillary: 129 mg/dL — ABNORMAL HIGH (ref 70–99)

## 2012-12-03 NOTE — ED Notes (Signed)
Obtain urine speimen cath in and out cath, per order of er doctor

## 2012-12-03 NOTE — Telephone Encounter (Signed)
Reviewed lab results with Bonita Quin, patient's caregiver who verbalized understanding and states she will call back to schedule repeat BMET

## 2012-12-03 NOTE — ED Notes (Addendum)
At 2300 this tech done in and out and collected the urinalysis on this patient and then was sent to the main lab to test urine.

## 2012-12-03 NOTE — ED Notes (Addendum)
Per EMS: pt from home. pt recently treated for a respiratory infection and placed on an antibiotic (unknown) pt is normally on 2L of O2 and pt was stating fine but wa placed on 3L of O2 during respiratory infection. Pt when coughing, has yellow sputum and is not fully able to get sputum up. Pt is usually alert and oriented but all day today she has been alerted not responding per her usual.

## 2012-12-03 NOTE — ED Notes (Signed)
Pt home RN and family at bedside.

## 2012-12-03 NOTE — ED Notes (Signed)
This is a 79 yo brought in d/t ams, lethergic since this am, not her current baseline Vital signs see flow sheet.will monitor for medical changes

## 2012-12-03 NOTE — Telephone Encounter (Signed)
New problem:  Mrs Dawn Barrett states she is returning a call from [employer]

## 2012-12-03 NOTE — ED Provider Notes (Signed)
CSN: [id number]     Arrival date & time 12/03/12  2055 History   First MD Initiated Contact with Patient 12/03/12 2128     Chief Complaint  Patient presents with  . Altered Mental Status   (Consider location/radiation/quality/duration/timing/severity/associated sxs/prior Treatment) Patient is a 79 y.o. female presenting with altered mental status. The history is provided by a relative and a caregiver.  Altered Mental Status Presenting symptoms: partial responsiveness   Severity:  Severe Most recent episode:  Today Episode history:  Continuous Timing:  Constant Progression:  Partially resolved Chronicity:  New Context: dementia and recent illness   Context: not a recent change in medication   Context comment:  Recently admitted 2 weeks ago for CHF exacerbation and MI.  Caretakers state today that she has been unresponsive and not eating or drinking.  chronic cough since d/c from hospital.  no choking while eating Associated symptoms: no abdominal pain, no difficulty breathing, no fever, no nausea and no vomiting     Past Medical History  Diagnosis Date  . Stroke   . Hypertension   . COPD (chronic obstructive pulmonary disease)   . Paroxysmal atrial fibrillation   . MI, old    Past Surgical History  Procedure Laterality Date  . Pacemaker insertion  11/19/2007    St. Jude Zephyr 5020 pulse generator, serial [id number]  . Femur surgery Right 06/2011    Rod inserted after a fall.    History reviewed. No pertinent family history. History  Substance Use Topics  . Smoking status: Never Smoker   . Smokeless tobacco: Not on file  . Alcohol Use: No   OB History   Grav Para Term Preterm Abortions TAB SAB Ect Mult Living                 Review of Systems  Constitutional: Positive for appetite change. Negative for fever.  Respiratory: Positive for cough. Negative for wheezing.   Cardiovascular: Positive for leg swelling.       Leg swelling improved from prior  Gastrointestinal:  Negative for nausea, vomiting and abdominal pain.  All other systems reviewed and are negative.    Allergies  Codeine and Sulfonamide derivatives  Home Medications   Current Outpatient Rx  Name  Route  Sig  Dispense  Refill  . amiodarone (PACERONE) 200 MG tablet   Oral   Take 2 tablets (400 mg total) by mouth 2 (two) times daily. 2 tabs 2 x day for 1 week, then 2 tabs daily.   75 tablet   11   . cholecalciferol (VITAMIN D) 1000 UNITS tablet   Oral   Take 2,000 Units by mouth daily.         . Cyanocobalamin 1000 MCG/15ML LIQD   Oral   Take 1,000 mcg by mouth daily.         Marland Kitchen levalbuterol (XOPENEX) 0.63 MG/3ML nebulizer solution   Nebulization   Take 3 mLs (0.63 mg total) by nebulization every 6 (six) hours as needed for wheezing.   3 mL   30     Please give 1 month supply   . nitroGLYCERIN (NITROSTAT) 0.4 MG SL tablet   Sublingual   Place 1 tablet (0.4 mg total) under the tongue every 5 (five) minutes x 3 doses as needed for chest pain.   25 tablet   12   . nystatin (MYCOSTATIN/NYSTOP) 100000 UNIT/GM POWD   Topical   Apply 1 g topically daily as needed (to affected area).         Marland Kitchen  vitamin C (ASCORBIC ACID) 500 MG tablet   Oral   Take 500 mg by mouth daily.         Marland Kitchen warfarin (COUMADIN) 1 MG tablet   Oral   Take 1 tablet (1 mg total) by mouth daily at 6 PM. 1 mg Mon-Tues-Wed-Fri-Sat; 0.5 mg Thursday and Sunday   33 tablet   11    BP 148/53  Pulse 76  Temp(Src) 95.4 F (35.2 C) (Rectal)  Resp 20  SpO2 97% Physical Exam  Nursing note and vitals reviewed. Constitutional: She is oriented to person, place, and time. She appears well-developed and well-nourished. No distress.  Pt looking around but non-verbal  HENT:  Head: Normocephalic and atraumatic.  Mouth/Throat: Oropharynx is clear and moist. Mucous membranes are dry.  Eyes: Conjunctivae and EOM are normal. Pupils are equal, round, and reactive to light.  Neck: Normal range of motion. Neck  supple.  Cardiovascular: Normal rate, regular rhythm and intact distal pulses.   No murmur heard. Pulmonary/Chest: Effort normal. No respiratory distress. She has no wheezes. She has rales in the right lower field and the left lower field.  Wet sounding cough  Abdominal: Soft. She exhibits no distension. There is no tenderness. There is no rebound and no guarding.  Musculoskeletal: Normal range of motion. She exhibits edema. She exhibits no tenderness.  1+ edema bilaterally  Neurological: She is alert and oriented to person, place, and time.  Skin: Skin is warm and dry. No rash noted. No erythema.  Psychiatric: She has a normal mood and affect. Her behavior is normal.    ED Course  Procedures (including critical care time) Labs Review Labs Reviewed  GLUCOSE, CAPILLARY - Abnormal; Notable for the following:    Glucose-Capillary 129 (*)    All other components within normal limits  CBC WITH DIFFERENTIAL  COMPREHENSIVE METABOLIC PANEL  URINALYSIS, ROUTINE W REFLEX MICROSCOPIC  PRO B NATRIURETIC PEPTIDE   Imaging Review Dg Chest 2 View  12/03/2012   *RADIOLOGY REPORT*  Clinical Data: Altered mental status.  CHEST - 2 VIEW  Comparison: Chest radiograph performed 11/19/2012  Findings: There has been interval improvement in bilateral airspace opacities.  Persistent small to moderate bilateral pleural effusions are seen, with associated airspace opacities possibly reflecting atelectasis or residual pneumonia.  No pneumothorax is seen, though the lung apices are partially obscured by the patient's head.  The cardiomediastinal silhouette is mildly enlarged.  A pacemaker is seen at the left chest wall, with leads ending at the right atrium and right ventricle.  Chronic right-sided rib deformities are seen.  There is a relatively severe compression deformity at the mid to lower thoracic spine, new from 2013, though with a somewhat chronic appearance.  IMPRESSION:  1.  Interval improvement in bilateral  airspace opacities; residual airspace opacities may reflect atelectasis or pneumonia. Persistent small to moderate bilateral pleural effusions seen. 2.  Mild cardiomegaly noted. 3.  Relatively severe compression deformity at the mid to lower thoracic spine, new from 2013, though with a somewhat chronic appearance.   Original Report Authenticated By: Tonia Ghent, M.D.    Date: 12/03/2012  Rate: 95  Rhythm: normal sinus rhythm  QRS Axis: normal  Intervals: normal  ST/T Wave abnormalities: nonspecific ST/T changes  Conduction Disutrbances:left bundle branch block  Narrative Interpretation:   Old EKG Reviewed: unchanged    MDM  No diagnosis found.  Patient brought in by family and caretakers do to altered mental status today. She apparently has been unresponsive and refuses  to eat or drink. She has had a persistent cough since hospitalization she was treated for congestive heart failure but family denies fever.  Here patient was found to be hypothermic at 95 rectally but family states she is more awake now than what she had been at home.  She denies any pain but does have coarse breath sounds bilaterally and pitting edema distally which family states is improved since prior. She was on Lasix however it caused her to become dehydrated and not was DC'd last week.  X-ray today shows interval improvement in bilateral air space opacities and residual they felt to reflect atelectasis or pneumonia. CBC, CMP, UA, BNP, troponin pending. EKG unchanged from prior.  12:02 AM Labs pending.  Pt checked out ot Dr. Lavella Lemons at 1200.  Gwyneth Sprout, MD 12/04/12 0002

## 2012-12-04 ENCOUNTER — Encounter (HOSPITAL_COMMUNITY): Payer: Self-pay | Admitting: Internal Medicine

## 2012-12-04 ENCOUNTER — Inpatient Hospital Stay (HOSPITAL_COMMUNITY): Payer: Medicare Other

## 2012-12-04 DIAGNOSIS — E860 Dehydration: Secondary | ICD-10-CM | POA: Diagnosis present

## 2012-12-04 DIAGNOSIS — I5022 Chronic systolic (congestive) heart failure: Secondary | ICD-10-CM

## 2012-12-04 DIAGNOSIS — A419 Sepsis, unspecified organism: Principal | ICD-10-CM

## 2012-12-04 DIAGNOSIS — J9600 Acute respiratory failure, unspecified whether with hypoxia or hypercapnia: Secondary | ICD-10-CM

## 2012-12-04 DIAGNOSIS — J690 Pneumonitis due to inhalation of food and vomit: Secondary | ICD-10-CM

## 2012-12-04 DIAGNOSIS — J9601 Acute respiratory failure with hypoxia: Secondary | ICD-10-CM | POA: Diagnosis present

## 2012-12-04 DIAGNOSIS — G9340 Encephalopathy, unspecified: Secondary | ICD-10-CM | POA: Diagnosis present

## 2012-12-04 DIAGNOSIS — R7989 Other specified abnormal findings of blood chemistry: Secondary | ICD-10-CM

## 2012-12-04 DIAGNOSIS — I4891 Unspecified atrial fibrillation: Secondary | ICD-10-CM

## 2012-12-04 DIAGNOSIS — R791 Abnormal coagulation profile: Secondary | ICD-10-CM | POA: Diagnosis present

## 2012-12-04 DIAGNOSIS — J189 Pneumonia, unspecified organism: Secondary | ICD-10-CM

## 2012-12-04 DIAGNOSIS — I340 Nonrheumatic mitral (valve) insufficiency: Secondary | ICD-10-CM | POA: Diagnosis present

## 2012-12-04 DIAGNOSIS — E870 Hyperosmolality and hypernatremia: Secondary | ICD-10-CM

## 2012-12-04 DIAGNOSIS — Z950 Presence of cardiac pacemaker: Secondary | ICD-10-CM

## 2012-12-04 DIAGNOSIS — I421 Obstructive hypertrophic cardiomyopathy: Secondary | ICD-10-CM | POA: Diagnosis present

## 2012-12-04 DIAGNOSIS — E43 Unspecified severe protein-calorie malnutrition: Secondary | ICD-10-CM | POA: Diagnosis present

## 2012-12-04 LAB — CBC WITH DIFFERENTIAL/PLATELET
Basophils Absolute: 0 10*3/uL (ref 0.0–0.1)
Eosinophils Absolute: 0 10*3/uL (ref 0.0–0.7)
Eosinophils Relative: 0 % (ref 0–5)
Hemoglobin: 12.7 g/dL (ref 12.0–15.0)
Lymphocytes Relative: 14 % (ref 12–46)
Lymphs Abs: 2.1 10*3/uL (ref 0.7–4.0)
MCH: 31.3 pg (ref 26.0–34.0)
MCHC: 31.4 g/dL (ref 30.0–36.0)
MCV: 99.5 fL (ref 78.0–100.0)
Monocytes Absolute: 1.2 10*3/uL — ABNORMAL HIGH (ref 0.1–1.0)
Monocytes Relative: 8 % (ref 3–12)
Platelets: 133 10*3/uL — ABNORMAL LOW (ref 150–400)
RDW: 16.5 % — ABNORMAL HIGH (ref 11.5–15.5)
WBC: 14.9 10*3/uL — ABNORMAL HIGH (ref 4.0–10.5)

## 2012-12-04 LAB — URINE MICROSCOPIC-ADD ON

## 2012-12-04 LAB — BASIC METABOLIC PANEL
BUN: 49 mg/dL — ABNORMAL HIGH (ref 6–23)
CO2: 32 mEq/L (ref 19–32)
Chloride: 112 mEq/L (ref 96–112)
Creatinine, Ser: 1.15 mg/dL — ABNORMAL HIGH (ref 0.50–1.10)

## 2012-12-04 LAB — URINALYSIS, ROUTINE W REFLEX MICROSCOPIC
Glucose, UA: NEGATIVE mg/dL
Ketones, ur: NEGATIVE mg/dL
Leukocytes, UA: NEGATIVE
Nitrite: NEGATIVE
Specific Gravity, Urine: 1.021 (ref 1.005–1.030)
pH: 6.5 (ref 5.0–8.0)

## 2012-12-04 LAB — PROTIME-INR
INR: >10 (ref 0.00–1.49)
INR: 4.33 — ABNORMAL HIGH (ref 0.00–1.49)
Prothrombin Time: 86.3 seconds — ABNORMAL HIGH (ref 11.6–15.2)
Prothrombin Time: 75.7 seconds — ABNORMAL HIGH (ref 11.6–15.2)
Prothrombin Time: 39.8 seconds — ABNORMAL HIGH (ref 11.6–15.2)

## 2012-12-04 LAB — CBC
HCT: 32 % — ABNORMAL LOW (ref 36.0–46.0)
MCHC: 32.2 g/dL (ref 30.0–36.0)
MCV: 98.5 fL (ref 78.0–100.0)
RDW: 16.6 % — ABNORMAL HIGH (ref 11.5–15.5)

## 2012-12-04 LAB — POCT I-STAT 3, VENOUS BLOOD GAS (G3P V)
Acid-Base Excess: 11 mmol/L — ABNORMAL HIGH (ref 0.0–2.0)
Bicarbonate: 37.3 mEq/L — ABNORMAL HIGH (ref 20.0–24.0)
pH, Ven: 7.408 — ABNORMAL HIGH (ref 7.250–7.300)
pO2, Ven: 31 mmHg (ref 30.0–45.0)

## 2012-12-04 LAB — TROPONIN I: Troponin I: <0.3 ng/mL (ref <0.30)

## 2012-12-04 LAB — POCT I-STAT TROPONIN I: Troponin i, poc: 0.18 ng/mL (ref 0.00–0.08)

## 2012-12-04 LAB — COMPREHENSIVE METABOLIC PANEL
ALT: 68 U/L — ABNORMAL HIGH (ref 0–35)
Calcium: 9.2 mg/dL (ref 8.4–10.5)
Creatinine, Ser: 1.14 mg/dL — ABNORMAL HIGH (ref 0.50–1.10)
GFR calc Af Amer: 47 mL/min — ABNORMAL LOW (ref >90)
Glucose, Bld: 137 mg/dL — ABNORMAL HIGH (ref 70–99)
Sodium: 153 mEq/L — ABNORMAL HIGH (ref 135–145)
Total Protein: 8.2 g/dL (ref 6.0–8.3)

## 2012-12-04 LAB — STREP PNEUMONIAE URINARY ANTIGEN: Strep Pneumo Urinary Antigen: NEGATIVE

## 2012-12-04 LAB — CK TOTAL AND CKMB (NOT AT ARMC): Relative Index: INVALID (ref 0.0–2.5)

## 2012-12-04 LAB — PRO B NATRIURETIC PEPTIDE: Pro B Natriuretic peptide (BNP): 5031 pg/mL — ABNORMAL HIGH (ref 0–450)

## 2012-12-04 MED ORDER — AMIODARONE HCL 200 MG PO TABS: 400.0000 mg | ORAL_TABLET | Freq: Two times a day (BID) | ORAL | Status: DC

## 2012-12-04 MED ORDER — WHITE PETROLATUM GEL
Status: AC
  Administered 2012-12-04: 17:00:00
  Filled 2012-12-04: qty 5

## 2012-12-04 MED ORDER — SODIUM CHLORIDE 0.9 % IV BOLUS (SEPSIS)
500.0000 mL | Freq: Once | INTRAVENOUS | Status: AC
  Administered 2012-12-04: 500 mL via INTRAVENOUS

## 2012-12-04 MED ORDER — WARFARIN SODIUM 1 MG PO TABS: 1.0000 mg | ORAL_TABLET | Freq: Every day | ORAL | Status: DC

## 2012-12-04 MED ORDER — ACETAMINOPHEN 650 MG RE SUPP: 650.0000 mg | Freq: Four times a day (QID) | RECTAL | Status: DC | PRN

## 2012-12-04 MED ORDER — ACETAMINOPHEN 325 MG PO TABS: 650.0000 mg | ORAL_TABLET | Freq: Four times a day (QID) | ORAL | Status: DC | PRN

## 2012-12-04 MED ORDER — TOBRAMYCIN-DEXAMETHASONE 0.3-0.1 % OP SUSP
1.0000 [drp] | Freq: Four times a day (QID) | OPHTHALMIC | Status: DC
  Administered 2012-12-04 – 2012-12-11 (×28): 1 [drp] via OPHTHALMIC
  Filled 2012-12-04: qty 2.5

## 2012-12-04 MED ORDER — AMIODARONE HCL 200 MG PO TABS: 400.0000 mg | ORAL_TABLET | Freq: Every day | ORAL | Status: DC

## 2012-12-04 MED ORDER — SODIUM CHLORIDE 0.9 % IV BOLUS (SEPSIS)
250.0000 mL | Freq: Once | INTRAVENOUS | Status: AC
  Administered 2012-12-04: 1000 mL via INTRAVENOUS

## 2012-12-04 MED ORDER — ONDANSETRON HCL 4 MG PO TABS: 4.0000 mg | ORAL_TABLET | Freq: Four times a day (QID) | ORAL | Status: DC | PRN

## 2012-12-04 MED ORDER — VITAMIN B-12 1000 MCG PO TABS
1000.0000 ug | ORAL_TABLET | Freq: Every day | ORAL | Status: DC
  Filled 2012-12-04: qty 1

## 2012-12-04 MED ORDER — VITAMIN D3 25 MCG (1000 UNIT) PO TABS
2000.0000 [IU] | ORAL_TABLET | Freq: Every day | ORAL | Status: DC
  Filled 2012-12-04: qty 2

## 2012-12-04 MED ORDER — VITAMIN C 500 MG PO TABS
500.0000 mg | ORAL_TABLET | Freq: Every day | ORAL | Status: DC
  Filled 2012-12-04: qty 1

## 2012-12-04 MED ORDER — PHYTONADIONE 5 MG PO TABS
5.0000 mg | ORAL_TABLET | Freq: Once | ORAL | Status: DC
  Filled 2012-12-04: qty 1

## 2012-12-04 MED ORDER — DEXTROSE 5 % IV SOLN
1.0000 g | INTRAVENOUS | Status: DC
  Administered 2012-12-04 – 2012-12-06 (×3): 1 g via INTRAVENOUS
  Filled 2012-12-04 (×4): qty 1

## 2012-12-04 MED ORDER — BIOTENE DRY MOUTH MT LIQD
15.0000 mL | Freq: Two times a day (BID) | OROMUCOSAL | Status: DC
  Administered 2012-12-04 – 2012-12-11 (×13): 15 mL via OROMUCOSAL

## 2012-12-04 MED ORDER — VITAMIN K1 10 MG/ML IJ SOLN
5.0000 mg | Freq: Once | INTRAMUSCULAR | Status: AC
  Administered 2012-12-04: 5 mg via INTRAVENOUS
  Filled 2012-12-04: qty 0.5

## 2012-12-04 MED ORDER — SODIUM CHLORIDE 0.9 % IV SOLN
INTRAVENOUS | Status: AC
  Administered 2012-12-04: 07:00:00 via INTRAVENOUS

## 2012-12-04 MED ORDER — ONDANSETRON HCL 4 MG/2ML IJ SOLN: 4.0000 mg | Freq: Four times a day (QID) | INTRAMUSCULAR | Status: DC | PRN

## 2012-12-04 MED ORDER — AMIODARONE HCL 200 MG PO TABS
400.0000 mg | ORAL_TABLET | Freq: Every day | ORAL | Status: DC
  Filled 2012-12-04: qty 2

## 2012-12-04 MED ORDER — VANCOMYCIN HCL IN DEXTROSE 1-5 GM/200ML-% IV SOLN
1000.0000 mg | Freq: Once | INTRAVENOUS | Status: AC
  Administered 2012-12-04: 1000 mg via INTRAVENOUS
  Filled 2012-12-04: qty 200

## 2012-12-04 MED ORDER — VANCOMYCIN HCL IN DEXTROSE 750-5 MG/150ML-% IV SOLN: 750.0000 mg | INTRAVENOUS | Status: DC

## 2012-12-04 MED ORDER — AMIODARONE HCL 200 MG PO TABS
400.0000 mg | ORAL_TABLET | Freq: Two times a day (BID) | ORAL | Status: DC
  Filled 2012-12-04 (×2): qty 2

## 2012-12-04 MED ORDER — LEVALBUTEROL HCL 0.63 MG/3ML IN NEBU
0.6300 mg | INHALATION_SOLUTION | Freq: Four times a day (QID) | RESPIRATORY_TRACT | Status: DC | PRN
  Administered 2012-12-05: 0.63 mg via RESPIRATORY_TRACT
  Filled 2012-12-04: qty 3

## 2012-12-04 MED ORDER — ASPIRIN 300 MG RE SUPP
300.0000 mg | Freq: Once | RECTAL | Status: AC
  Administered 2012-12-04: 300 mg via RECTAL
  Filled 2012-12-04: qty 1

## 2012-12-04 MED ORDER — SODIUM CHLORIDE 0.9 % IV SOLN
1.5000 g | Freq: Once | INTRAVENOUS | Status: AC
  Administered 2012-12-04: 1.5 g via INTRAVENOUS
  Filled 2012-12-04: qty 1.5

## 2012-12-04 NOTE — ED Provider Notes (Signed)
Patient signed out to me by Dr. Anitra Lauth. She was brought in by caregivers for evaluation of decreased mental status and copious & thick secretions in the mouth requiring frequent suctioning. She was released from this hospital approx 8 days ago after an admission for acutely decompensated heart failure. Caregivers state that her condition has steadily declined since discharge. Her po intake has been diminished over the past couple of days. No fever appreciated. The patient has complained of pain and has not seemed to be in pain.   Please see Dr. Loralee Pacas note for PE.  When I saw the patient, she was hunched over with chin almost at chest. She was responsive to verbal stimuli but really only able to answer yes or no questions. Noted to be tachypneic with RR in 28 range. Abd soft and benign.   ED work up and clinical hx are concerning for aspiration pneumonia with possible overlay of pulmonary edema although CXR appears generally improved from 1 week ago. Tpn is mildly elevated at 0.18 - an increase from 0.12 8 days ago. CK MB mildly elevated. No acute EKG changes. Tx with ASA. Patient anticoagulated on Coumadin.   Blood work is notable for leukocytosis, acute kidney injury with azotemia and hypernatremia. U/A is negative for UTI.  We are cautiously resuscitating with NS. The patient was initially hypothermic but, this has improved. The patient has had soft blood pressures with dips into the 80s. MAPs > 65 mmHg. We are treating empirically with Vancomycin and Unasyn for HCAP with sepsis. Case discussed with Dr. Betti Cruz who will see and admit.   Findings also discussed with the patient's dtr who lives out of town, and the daughter's husband, a Insurance underwriter. DNR status confirmed.   CRITICAL CARE Performed by: Brandt Loosen   Total critical care time: 20m  Critical care time was exclusive of separately billable procedures and treating other patients.  Critical care was necessary to treat or prevent  imminent or life-threatening deterioration.  Critical care was time spent personally by me on the following activities: development of treatment plan with patient and/or surrogate as well as nursing, discussions with consultants, evaluation of patient's response to treatment, examination of patient, obtaining history from patient or surrogate, ordering and performing treatments and interventions, ordering and review of laboratory studies, ordering and review of radiographic studies, pulse oximetry and re-evaluation of patient's condition.   Brandt Loosen, MD 12/04/12 [phone]
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
## 2012-12-04 NOTE — Progress Notes (Signed)
ANTICOAGULATION CONSULT NOTE - Follow Up  Pharmacy Consult for Warfarin and Vancomycin Indication: Atrial fibrillation and sepsis  Allergies  Allergen Reactions  . Codeine Other (See Comments)    REACTION: unknown  . Sulfonamide Derivatives Other (See Comments)    REACTION:  unknown   Patient Measurements: Height: 5\' 5"  (165.1 cm) Weight: 122 lb 12.7 oz (55.7 kg) IBW/kg (Calculated) : 57  Vital Signs: Temp: 97.4 F (36.3 C) (10/09 1624) Temp src: Axillary (10/09 1624) BP: 160/100 mmHg (10/09 1624) Pulse Rate: 72 (10/09 1624)  Labs:  Recent Labs  12/04/12 0009 12/04/12 0115 12/04/12 0345 12/04/12 0925 12/04/12 1540  HGB 12.7  --   --  10.3*  --   HCT 40.4  --   --  32.0*  --   PLT 133*  --   --  124*  --   LABPROT  --   --  86.3* 75.7* 39.8*  INR  --   --  >10.00* >10.00* 4.33*  CREATININE 1.14*  --   --  1.15*  --   CKTOTAL  --  39  --   --   --   CKMB  --  4.6*  --   --   --   TROPONINI  --   --  <0.30  --   --     Estimated Creatinine Clearance: 27.4 ml/min (by C-G formula based on Cr of 1.15).   Medical History: Past Medical History  Diagnosis Date  . Stroke     a. L MCA CVA 2009.  Marland Kitchen Hypertension   . COPD (chronic obstructive pulmonary disease)   . Paroxysmal atrial fibrillation   . NSTEMI (non-ST elevated myocardial infarction)     a. 10/2012 - managed medically.  . Chronic combined systolic and diastolic CHF (congestive heart failure)   . Symptomatic bradycardia     a. s/p St. Jude pacemaker 2009.  Marland Kitchen PNA (pneumonia)     a. Per notes, h/o pulm injury r/t PNA.  . LBBB (left bundle branch block)   . HOCM (hypertrophic obstructive cardiomyopathy)     a. 2D Echo 10/2012: EF 40-45%, mild LVH, mild-mod TR, mitral SAM with mod MR, LVOT gradient not well assessed but at least 2.46m/s; findings c/w HOCM.  Marland Kitchen Mitral regurgitation     a. Mod MR by echo 10/2012.  . Tricuspid regurgitation     a. Mild-mod TR by echo 10/2012.   Medications:  Prescriptions prior  to admission  Medication Sig Dispense Refill  . amiodarone (PACERONE) 200 MG tablet Take 2 tablets (400 mg total) by mouth 2 (two) times daily. 2 tabs 2 x day for 1 week, then 2 tabs daily.  75 tablet  11  . cholecalciferol (VITAMIN D) 1000 UNITS tablet Take 2,000 Units by mouth daily.      . Cyanocobalamin 1000 MCG/15ML LIQD Take 1,000 mcg by mouth daily.      Marland Kitchen levalbuterol (XOPENEX) 0.63 MG/3ML nebulizer solution Take 3 mLs (0.63 mg total) by nebulization every 6 (six) hours as needed for wheezing.  3 mL  30  . nitroGLYCERIN (NITROSTAT) 0.4 MG SL tablet Place 1 tablet (0.4 mg total) under the tongue every 5 (five) minutes x 3 doses as needed for chest pain.  25 tablet  12  . nystatin (MYCOSTATIN/NYSTOP) 100000 UNIT/GM POWD Apply 1 g topically daily as needed (to affected area).      . vitamin C (ASCORBIC ACID) 500 MG tablet Take 500 mg by mouth daily.      Marland Kitchen  warfarin (COUMADIN) 1 MG tablet Take 1 tablet (1 mg total) by mouth daily at 6 PM. 1 mg Mon-Tues-Wed-Fri-Sat; 0.5 mg Thursday and Sunday  33 tablet  11   Assessment: 79 yo female admitted with AMS, likely sepsis, for empiric antibiotics.  Vancomycin 1 g IV given in ED at 0230.  She has been receiving chronic Warfarin therapy for hx. of Afib.  She had a supra-therapeutic INR on admit which is likely due to the sepsis and poor PO intake.  Vitamin K 5mg  was given and her repeat INR is 4.33.  She is without noted bleeding complications.  She may require thrice weekly dosing with her poor PO intake.  Chest xray reveals likely PNA or atelectasis.  She is tolerating her vancomycin and Cefepime dosing.  She has an estimated crcl of ~ 27 ml/min and doses have been adjusted for her renal function.   Goal of Therapy:  Vancomycin trough 15-20 INR 2-3 Monitor platelets by anticoagulation protocol: Yes   Plan:  1.  Continue current Vancomycin 750 IV q48h 2.  Hold Warfarin therapy for now.   3.  PT/INR daily  Nadara Mustard, PharmD., MS Clinical  Pharmacist Pager:  [phone] Thank you for allowing pharmacy to be part of this patients care team. 12/04/2012,5:06 PM

## 2012-12-04 NOTE — Consult Note (Signed)
PULMONARY  / CRITICAL CARE MEDICINE  Name: Dawn Barrett MRN: [id number] DOB: April 04, 1918    ADMISSION DATE:  12/03/2012 CONSULTATION DATE:  12/04/2012  REFERRING MD :  Betti Cruz PRIMARY SERVICE: TRH  CHIEF COMPLAINT:  Hypotension  BRIEF PATIENT DESCRIPTION: 79 y/o female with multiple comorbid illnesses presented to the [facility] ED with hypotension in the setting of cough, decreased responsiveness, and no po intake for several days.  PCCM consulted for hypotension.  SIGNIFICANT EVENTS / STUDIES:    LINES / TUBES:   CULTURES: 10/9 blood >>  ANTIBIOTICS: 10/9 Ampicillin/sulbactam >>  HISTORY OF PRESENT ILLNESS:  79 y/o female with multiple comorbid illnesses presented to the [facility] ED with hypotension in the setting of cough, decreased responsiveness, and no po intake for several days.  PCCM consulted for hypotension. She is non-verbal so her caregivers provide the history.  They state that for the last 2-3 days she has been coughing more, not taking by mouth, and has been less responsive.  In the ED she was found to be hypotensive and to have an elevated WBC count.  No reported fever or chills.    PAST MEDICAL HISTORY :  Past Medical History  Diagnosis Date  . Stroke   . Hypertension   . COPD (chronic obstructive pulmonary disease)   . Paroxysmal atrial fibrillation   . MI, old    Past Surgical History  Procedure Laterality Date  . Pacemaker insertion  11/19/2007    St. Jude Zephyr 5020 pulse generator, serial [id number]  . Femur surgery Right 06/2011    Rod inserted after a fall.    Prior to Admission medications   Medication Sig Start Date End Date Taking? Authorizing Provider  amiodarone (PACERONE) 200 MG tablet Take 2 tablets (400 mg total) by mouth 2 (two) times daily. 2 tabs 2 x day for 1 week, then 2 tabs daily. 11/21/12  Yes [name], PA-C  cholecalciferol (VITAMIN D) 1000 UNITS tablet Take 2,000 Units by mouth daily.   Yes Historical Provider, MD  Cyanocobalamin 1000  MCG/15ML LIQD Take 1,000 mcg by mouth daily.   Yes Historical Provider, MD  levalbuterol Pauline Aus) 0.63 MG/3ML nebulizer solution Take 3 mLs (0.63 mg total) by nebulization every 6 (six) hours as needed for wheezing. 11/21/12  Yes [name], PA-C  nitroGLYCERIN (NITROSTAT) 0.4 MG SL tablet Place 1 tablet (0.4 mg total) under the tongue every 5 (five) minutes x 3 doses as needed for chest pain. 11/21/12  Yes [name], PA-C  nystatin (MYCOSTATIN/NYSTOP) 100000 UNIT/GM POWD Apply 1 g topically daily as needed (to affected area).   Yes Historical Provider, MD  vitamin C (ASCORBIC ACID) 500 MG tablet Take 500 mg by mouth daily.   Yes Historical Provider, MD  warfarin (COUMADIN) 1 MG tablet Take 1 tablet (1 mg total) by mouth daily at 6 PM. 1 mg Mon-Tues-Wed-Fri-Sat; 0.5 mg Thursday and Sunday 11/21/12  Yes [name], PA-C   Allergies  Allergen Reactions  . Codeine Other (See Comments)    REACTION: unknown  . Sulfonamide Derivatives Other (See Comments)    REACTION:  unknown    FAMILY HISTORY: SOCIAL HISTORY:REVIEW OF SYSTEMS:  Cannot obtain due to aphasia/decreased somnolence  SUBJECTIVE:   VITAL SIGNS: Temp:  [95.4 F (35.2 C)-98.5 F (36.9 C)] 98.5 F (36.9 C) (10/08 2245) Pulse Rate:  [65-76] 69 (10/09 0215) Resp:  [13-28] 23 (10/09 0215) BP: (82-151)/(32-71) 145/58 mmHg (10/09 0215) SpO2:  [94 %-100 %] 98 % (10/09  0215) HEMODYNAMICS:   VENTILATOR SETTINGS:   INTAKE / OUTPUT: Intake/Output   None     PHYSICAL EXAMINATION:  Gen: chronically ill appearing HEENT: head flexion (chronic?), thick sclera bilaterally, dry mucus membranes PULM: diminished airflow in bases, no wheezing, normal respiratory effort CV: paced, no murmur AB: BS infrequent but positive, soft, non tender, no guarding or rebound Ext: trace edema Derm: dry skin Neuro: minimal response to tactile or verbal stimuli, non-verbal, MSK: severely kyphotic  LABS:  CBC Recent Labs      12/04/12  0009  WBC  14.9*  HGB  12.7  HCT  40.4  PLT  133*   Coag's No results found for this basename: APTT, INR,  in the last 72 hours BMET Recent Labs     12/04/12  0009  NA  153*  K  4.7  CL  112  CO2  33*  BUN  46*  CREATININE  1.14*  GLUCOSE  137*   Electrolytes Recent Labs     12/04/12  0009  CALCIUM  9.2   Sepsis Markers No results found for this basename: LACTICACIDVEN, PROCALCITON, O2SATVEN,  in the last 72 hours ABG No results found for this basename: PHART, PCO2ART, PO2ART,  in the last 72 hours Liver Enzymes Recent Labs     12/04/12  0009  AST  114*  ALT  68*  ALKPHOS  586*  BILITOT  1.1  ALBUMIN  2.2*   Cardiac Enzymes Recent Labs     12/04/12  0009  PROBNP  5031.0*   Glucose Recent Labs     12/03/12  2335  GLUCAP  129*    Imaging   CXR: RLL infiltrate, cardiomegally, kyphosis  ASSESSMENT / PLAN:  PULMONARY A: Persistent RLL infiltrate > suspect atelectasis and possibly HCAP/aspiration given cough and elevated WBC COPD, not in exacerbation P:   -npo for now -see ID -HOB -prn Xopenex  CARDIOVASCULAR A: Hypotension most likely due to volume depletion > she is dry on exam and she has not had good po intake lately, further, she had recently been receiving diuresis after her 10/2012 hospitalization until 11/26/2012 Afib CHF > not in exacerbation P:  -would continue to give gentle IVF and monitor respiratory status closely -no role for central line, vasopressors -would consult Dr. Graciela Husbands 10/9 AM  RENAL A:  Pre-renal renal failure Hyeprnatremia P:   -IVF  GASTROINTESTINAL A:  Elevated alk phos without belly tenderness; uncertain cause, but would still want to rule out biliary pathology considering elevated WBC P:   -would check RUQ ultrasound  HEMATOLOGIC A:  No acute issues P:  -monitor for bleeding  INFECTIOUS A:  HCAP Question biliary pathology based on LFTs P:   -would change antibiotics to Vanc, Zosyn -f/u  cultures  ENDOCRINE A:  No acute issues P:   -monitor glucose  NEUROLOGIC A:  Old Stroke Aphasia Generalized weakness P:   -no sedating meds  Code status: DNR per [facility] physician and the patient's caregivers  Global: There is no role for vasopressor agents at this time.  Her physical exam and history supports hypovolemia as the cause of her hypotension.  Further, considering her age and multiple comorbid illnesses, there would be very little benefit to placing a central line and administering vasopressors.  I recommend broad spectrum antibiotics, RUQ ultrasound, continued IVF, and admission to step down.  PCCM will sign off, call if questions.  Fonnie Jarvis Pulmonary and [facility] Pager: [phone]  12/04/2012, 3:53  AM    

## 2012-12-04 NOTE — Progress Notes (Signed)
Utilization Review Completed.  

## 2012-12-04 NOTE — H&P (Signed)
Patient's PCP: Default, Provider, MD  Chief Complaint: Altered mental status  History of Present Illness: Dawn Barrett is a 79 y.o. Caucasian female history of CVA 5 years ago resulting in dysphasia on a pured diet, hypertension, COPD, paroxysmal atrial fibrillation on chronic anticoagulation, MI, status post pacemaker, recent history of acute on chronic systolic and diastolic congestive heart for which she was hospitalized from 11/14/2012 till 11/21/2012.  Subsequently after discharge she followed up with her cardiologist, Dr. Graciela Husbands, Lasix was discontinued due to concern for dehydration.  Patient is not able to provide any history at this time, caretakers at bedside provide most of the history.  Patient yesterday has had increased confusion, normally she is able to have simple interaction with the caretakers, however she was not able to do the at home.  She was lethargic.  She also had cough at home.  She was suctioned aggressively at home and was brought to the emergency department for further care.  Hospitalist service was asked to admit the patient for further care and management  Review of Systems: Cannot be obtained from the patient due to her confusion.  Past Medical History  Diagnosis Date  . Stroke   . Hypertension   . COPD (chronic obstructive pulmonary disease)   . Paroxysmal atrial fibrillation   . MI, old    Past Surgical History  Procedure Laterality Date  . Pacemaker insertion  11/19/2007    [employer] Zephyr 5020 pulse generator, serial [id number]  . Femur surgery Right 06/2011    Rod inserted after a fall.    Family History  Problem Relation Age of Onset  . Heart disease Mother    History   Social History  . Marital Status: Widowed    Spouse Name: N/A    Number of Children: N/A  . Years of Education: N/A   Occupational History  . Retired    Social History Main Topics  . Smoking status: Never Smoker   . Smokeless tobacco: Not on file  . Alcohol Use: No   . Drug Use: No  . Sexual Activity: Not on file   Other Topics Concern  . Not on file   Social History Narrative   Pt lives with daughter in [place] but has been in [place] recently.   Allergies: Codeine and Sulfonamide derivatives  Home Meds: Prior to Admission medications   Medication Sig Start Date End Date Taking? Authorizing Provider  amiodarone (PACERONE) 200 MG tablet Take 2 tablets (400 mg total) by mouth 2 (two) times daily. 2 tabs 2 x day for 1 week, then 2 tabs daily. 11/21/12  Yes [name], PA-C  cholecalciferol (VITAMIN D) 1000 UNITS tablet Take 2,000 Units by mouth daily.   Yes Historical Provider, MD  Cyanocobalamin 1000 MCG/15ML LIQD Take 1,000 mcg by mouth daily.   Yes Historical Provider, MD  levalbuterol Pauline Aus) 0.63 MG/3ML nebulizer solution Take 3 mLs (0.63 mg total) by nebulization every 6 (six) hours as needed for wheezing. 11/21/12  Yes [name], PA-C  nitroGLYCERIN (NITROSTAT) 0.4 MG SL tablet Place 1 tablet (0.4 mg total) under the tongue every 5 (five) minutes x 3 doses as needed for chest pain. 11/21/12  Yes [name], PA-C  nystatin (MYCOSTATIN/NYSTOP) 100000 UNIT/GM POWD Apply 1 g topically daily as needed (to affected area).   Yes Historical Provider, MD  vitamin C (ASCORBIC ACID) 500 MG tablet Take 500 mg by mouth daily.   Yes Historical Provider, MD  warfarin (COUMADIN)  1 MG tablet Take 1 tablet (1 mg total) by mouth daily at 6 PM. 1 mg Mon-Tues-Wed-Fri-Sat; 0.5 mg Thursday and Sunday 11/21/12  Yes Joline Salt Barrett, PA-C    Physical Exam: Blood pressure 145/58, pulse 69, temperature 98.5 F (36.9 C), temperature source Rectal, resp. rate 23, SpO2 98.00%. General: Somnolent, not oriented x3, No acute distress, nonverbal. HEENT: slightly dry mucous membranes Neck: Supple CV: S1 and S2 Lungs: Good respiratory effort bilaterally, scattered crackles at bases. Abdomen: Soft, Nontender, Nondistended, +bowel sounds. Ext: Good  pulses. Trace edema. No clubbing or cyanosis noted. Neuro: Cannot be assessed due to patient's compliance.  Lab results:  Recent Labs  12/04/12 0009  NA 153*  K 4.7  CL 112  CO2 33*  GLUCOSE 137*  BUN 46*  CREATININE 1.14*  CALCIUM 9.2    Recent Labs  12/04/12 0009  AST 114*  ALT 68*  ALKPHOS 586*  BILITOT 1.1  PROT 8.2  ALBUMIN 2.2*   No results found for this basename: LIPASE, AMYLASE,  in the last 72 hours  Recent Labs  12/04/12 0009  WBC 14.9*  NEUTROABS 11.5*  HGB 12.7  HCT 40.4  MCV 99.5  PLT 133*    Recent Labs  12/04/12 0115  CKTOTAL 39  CKMB 4.6*   No components found with this basename: POCBNP,  No results found for this basename: DDIMER,  in the last 72 hours No results found for this basename: HGBA1C,  in the last 72 hours No results found for this basename: CHOL, HDL, LDLCALC, TRIG, CHOLHDL, LDLDIRECT,  in the last 72 hours No results found for this basename: TSH, T4TOTAL, FREET3, T3FREE, THYROIDAB,  in the last 72 hours No results found for this basename: VITAMINB12, FOLATE, FERRITIN, TIBC, IRON, RETICCTPCT,  in the last 72 hours Imaging results:  Dg Chest 2 View  12/03/2012   *RADIOLOGY REPORT*  Clinical Data: Altered mental status.  CHEST - 2 VIEW  Comparison: Chest radiograph performed 11/19/2012  Findings: There has been interval improvement in bilateral airspace opacities.  Persistent small to moderate bilateral pleural effusions are seen, with associated airspace opacities possibly reflecting atelectasis or residual pneumonia.  No pneumothorax is seen, though the lung apices are partially obscured by the patient's head.  The cardiomediastinal silhouette is mildly enlarged.  A pacemaker is seen at the left chest wall, with leads ending at the right atrium and right ventricle.  Chronic right-sided rib deformities are seen.  There is a relatively severe compression deformity at the mid to lower thoracic spine, new from 2013, though with a  somewhat chronic appearance.  IMPRESSION:  1.  Interval improvement in bilateral airspace opacities; residual airspace opacities may reflect atelectasis or pneumonia. Persistent small to moderate bilateral pleural effusions seen. 2.  Mild cardiomegaly noted. 3.  Relatively severe compression deformity at the mid to lower thoracic spine, new from 2013, though with a somewhat chronic appearance.   Original Report Authenticated By: Tonia Ghent, M.D.   Dg Chest Bilateral Decubitus  11/14/2012   CLINICAL DATA:  Evaluate for pleural effusions. Shortness of breath.  EXAM: CHEST - BILATERAL DECUBITUS VIEW  COMPARISON:  11/14/2012 chest x-ray.  FINDINGS: Bilateral pleural effusions are noted to partially layered on decubitus views.  Findings suggestive of congestive heart failure. Sequential pacemaker is in place. Cardiomegaly. Tortuous aorta.  IMPRESSION: Bilateral pleural effusions are noted to partially layered on decubitus views.  Findings suggestive of congestive heart failure.  Sequential pacemaker is in place. Cardiomegaly.  Tortuous aorta.  Electronically Signed   By: Bridgett Larsson   On: 11/14/2012 16:57   Dg Chest Port 1 View  11/19/2012   CLINICAL DATA:  Productive cough and fever  EXAM: PORTABLE CHEST - 1 VIEW  COMPARISON:  Chest x-ray 2 days prior  FINDINGS: Left approach dual-chamber pacer shows no interval displacement.  Unchanged cardiomegaly. Mediastinal contours are distorted by right rotation.  Moderate bilateral pleural effusions. Interstitial coarsening diffusely with asymmetric right perihilar opacity. Lung aeration is unchanged from 2 days prior.  IMPRESSION: 1. Asymmetric right-sided opacity which could represent pneumonia, unchanged from 2 days prior. 2. Moderate pleural effusions with mild pulmonary edema.   Electronically Signed   By: Tiburcio Pea   On: 11/19/2012 03:05   Dg Chest Port 1 View  11/17/2012   CLINICAL DATA:  Shortness of breast he  EXAM: PORTABLE CHEST - 1 VIEW   COMPARISON:  Single view of the chest 11/14/2012 and PA and lateral chest 07/01/2011.  FINDINGS: There is cardiomegaly. Airspace disease is again seen throughout the right chest. A milder degree of airspace disease is seen in the left mid and lower lung zones. There are bilateral pleural effusions.  IMPRESSION: Cardiomegaly with bilateral pleural effusions and right worse than left airspace disease which could represent asymmetric edema or pneumonia.   Electronically Signed   By: Drusilla Kanner M.D.   On: 11/17/2012 05:40   Dg Chest Portable 1 View  11/14/2012   CLINICAL DATA:  Shortness of Breath. Congestion. Pneumonia.  EXAM: PORTABLE CHEST - 1 VIEW  COMPARISON:  07/01/2011.  FINDINGS: Sequential pacemaker is in place entering from the left. Full extent of the leads not included. The portions which are visualized do not demonstrate gross interruption. There is narrowing of a proximal lead adjacent the battery pack. Tips appear to be in the region of the right atrium and right ventricle.  Marked cardiomegaly.  Diffuse asymmetric airspace disease with bilateral pleural effusions may represent pulmonary edema. Infectious infiltrate cannot be excluded in the proper clinical setting. Additionally, underlying mass could not be excluded given the pleural effusions and mediastinal prominence.  No gross pneumothorax.  Remote right-sided rib fractures.  IMPRESSION: Marked cardiomegaly with sequential pacemaker in place.  Asymmetric airspace disease with bilateral pleural effusions may represent pulmonary edema although infectious infiltrate cannot be excluded in proper clinical setting.  Follow-up in the clearance recommended.   Electronically Signed   By: Bridgett Larsson   On: 11/14/2012 09:12   Other results: EKG: Left bundle branch block heart rate 95.  Assessment & Plan by Problem: Sepsis likely due to aspiration pneumonia causing hypothermia and hypotension Admit to step down.  Start broad-spectrum antibiotics  with vancomycin and cefepime.  Send for blood cultures x2.  Will fluid resuscitate the patient with normal saline.  Requested critical care consult to help manage this critically ill patient.  Appreciate their input.  Discussed with patient's daughter, if the patient needs pressors, daughter was ok with starting the patient on pressors.  Mildly elevated troponin/NSTEMI Siobhan plan of care was only mildly elevated.  Discussed with cardiology, Dr. Ronney Asters, hold off on anticoagulation with heparin at this time at this time.  Appreciate their input.  Patient not started on aspirin as patient is on chronic anticoagulation with Coumadin.  Atrial fibrillation Rate controlled.  Continue anticoagulation on Coumadin.  Dysphasia Will have the patient n.p.o. until speech therapy evaluate the patient in the morning.  Acute and chronic combined systolic and diastolic congestive heart Patient has been  off of diuretics.  Due to your hypertension, is receiving IV hydration.  Requested cardiology consultation for further management.  Acute encephalopathy Suspect patient has underlying dementia at baseline.  Will get a head CT to evaluate for any intracranial etiology given patient is on anticoagulation.  Hypernatremia Due to dehydration.  Hydrate the patient and reassess sodium.  History of CVA Will get a head CT as indicated above.  Otherwise stable.  CODE STATUS DO NOT RESUSCITATE/DO NOT INTUBATE.  Patient has GOLD out of facility DO NOT RESUSCITATE form. Discussed with patient's daughter Ambrose Mantle, HOPA about patient's condition and CODE STATUS, daughter confirmed DO NOT RESUSCITATE/DO NOT INTUBATE.  Overall poor prognosis, if patient does not improve in the next one to 2 days, may consider getting palliative care involved, daughter was notified of plan.  Prophylaxis On Coumadin.  Disposition Admit the patient to step down for closer monitoring.  Critical care time spent on admission, talking  to the patient, and coordinating care was: 60 mins, greater than 35 minutes were spent at patient's bedside.  Bronwyn Belasco A, MD 12/04/2012, 3:40 AM

## 2012-12-04 NOTE — Progress Notes (Signed)
[facility] Progress Note  TEAM 1 - Stepdown ICU Team   Dawn Barrett ZOX:[id number] DOB: August 17, 1918 DOA: 12/03/2012 PCP: Default, Provider, MD  Brief narrative:  Assessment/Plan: Active Problems:   Sepsis -suspected due to aspiration pneumonia -follow up on all cx's  Acute on chronic respiratory failure with hypoxia due to:   A) Aspiration pneumonia   B) COPD -per dtr on chronic O2 at 2.5 L/ same dose here -d/w dtr rationale for not continuing scheduled q 4 hour Xopenex nebs while here since not actively wheezing- use prn only -cont empiric anbx to cover for aspiration - recent CXR shows improvement when compared to most recent during last admit last month -doubt CHF exacerbation since is dehydrated so holding Lasix    Acute encephalopathy/ lethargic -metabolic etiology due to hypernatremia -ck urine cx despite benign UA- was on anbx's pre admit which could influence UA appearance    Dehydration with hypernatremia -Na slowly trend down with rehydration -follow lytes    Elevated LFTs -probably due to low perfusion from [facility] but could be from Amiodarone- follow trends -AP markedly elevated- likely due to [facility]- Ca also was elevated and decreased with fluids    Protein-calorie malnutrition, severe/known dysphagia after CVA -SLP has evaluated pt this admission but may need to defer add'l eval (MBSS) until Na normalized    Elevated troponin -due to [facility]    Supratherapeutic INR/Long term (current) use of anticoagulants -multifactorial: hypo albunemia, DH and recent use of anbx's -has been given Vit K since initial INR >10 -d/w dtr may need to consider stopping Coumadin given pt's advanced age -repeat INR at 2pm    Paroxysmal atrial fibrillation/H/O cardiac pacemaker -Cards following -currently rate controlled -since NPO will hold all PO's including Amiodarone -dtr says pt has significant hypotension with BB in past    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)/Mitral regurgitation -Cards following -in future likely will use Lasix prn fluid weight gain and not scheduled   Bilateral conjunctivitis -on Tobra eye gtts pre admit   DVT prophylaxis: Supratherapeutic INR-consider SCDs once INR subtherapeutic Code Status: DO NOT RESUSCITATE Family Communication: Spoke with daughter Rod Holler [phone] (10/9) Disposition Plan/Expected LOS: Althia Forts Isolation: None Nutritional Status: Acute on chronic protein calorie malnutrition as evidenced by low albumin and low body weight in setting of patient with known dysphagia post CVA  Consultants: Cardiology  Procedures: None  Antibiotics: Unasyn times one dose in the emergency department Cefepime 10/9 >>> Vancomycin 10/8 >>>  HPI/Subjective: Patient quite lethargic and doesn't really make eye contact but also has known communication issues related to previous stroke. Caretakers at bedside and all questions and answered  Objective: Blood pressure 150/59, pulse 68, temperature 97.7 F (36.5 C), temperature source Oral, resp. rate 26, height 5\' 5"  (1.651 m), weight 55.7 kg (122 lb 12.7 oz), SpO2 100.00%. No intake or output data in the 24 hours ending 12/04/12 1240   Exam: Patient admitted at 3:43 AM today so followup exam completed.  Scheduled Meds:  Scheduled Meds: . amiodarone  400 mg Oral Daily  . antiseptic oral rinse  15 mL Mouth Rinse BID  . ceFEPime (MAXIPIME) IV  1 g Intravenous Q24H  . cholecalciferol  2,000 Units Oral Daily  . [START ON 12/06/2012] vancomycin  750 mg Intravenous Q48H  . vitamin B-12  1,000 mcg Oral Daily  . vitamin C  500 mg Oral Daily  . white petrolatum       Continuous Infusions: . sodium chloride 75 mL/hr at 12/04/12 0640    **  Reviewed in detail by the Attending Physicia  Data Reviewed: Basic Metabolic Panel:  Recent Labs Lab 12/04/12 0009 12/04/12 0925  NA 153* 151*  K 4.7 4.2  CL 112 112  CO2 33* 32  GLUCOSE 137* 125*    BUN 46* 49*  CREATININE 1.14* 1.15*  CALCIUM 9.2 8.8   Liver Function Tests:  Recent Labs Lab 12/04/12 0009  AST 114*  ALT 68*  ALKPHOS 586*  BILITOT 1.1  PROT 8.2  ALBUMIN 2.2*   No results found for this basename: LIPASE, AMYLASE,  in the last 168 hours No results found for this basename: AMMONIA,  in the last 168 hours CBC:  Recent Labs Lab 12/04/12 0009 12/04/12 0925  WBC 14.9* 14.9*  NEUTROABS 11.5*  --   HGB 12.7 10.3*  HCT 40.4 32.0*  MCV 99.5 98.5  PLT 133* 124*   Cardiac Enzymes:  Recent Labs Lab 12/04/12 0115 12/04/12 0345  CKTOTAL 39  --   CKMB 4.6*  --   TROPONINI  --  <0.30   BNP (last 3 results)  Recent Labs  11/14/12 0928 11/26/12 1647 12/04/12 0009  PROBNP 3007.0* 254.0* 5031.0*   CBG:  Recent Labs Lab 12/03/12 2335  GLUCAP 129*    Recent Results (from the past 240 hour(s))  MRSA PCR SCREENING     Status: None   Collection Time    12/04/12  5:17 AM      Result Value Range Status   MRSA by PCR NEGATIVE  NEGATIVE Final   Comment:            The GeneXpert MRSA Assay (FDA     approved for NASAL specimens     only), is one component of a     comprehensive MRSA colonization     surveillance program. It is not     intended to diagnose MRSA     infection nor to guide or     monitor treatment for     MRSA infections.     Studies:  Recent x-ray studies have been reviewed in detail by the Attending Physician    Junious Silk, ANP [facility] Office  [phone] Pager [phone]  **If unable to reach the above provider after paging please contact the Flow Manager @ [phone]  On-Call/Text Page:      [email or web address]      password [email or web address]  If 7PM-7AM, please contact night-coverage [email or web address] Password [email or web address] 12/04/2012, 12:40 PM   LOS: 1 day   I have examined the patient, reviewed the chart and modified the above note which I agree with.   Burley Kopka,MD [phone] 12/04/2012, 3:27 PM

## 2012-12-04 NOTE — Progress Notes (Signed)
ANTICOAGULATION CONSULT NOTE - Initial Consult  Pharmacy Consult for Warfarin and Vancomycin Indication: Atrial fibrillation and sepsis  Allergies  Allergen Reactions  . Codeine Other (See Comments)    REACTION: unknown  . Sulfonamide Derivatives Other (See Comments)    REACTION:  unknown    Patient Measurements: Height: 5\' 5"  (165.1 cm) Weight: 122 lb 12.7 oz (55.7 kg) IBW/kg (Calculated) : 57  Vital Signs: Temp: 97.3 F (36.3 C) (10/09 0450) Temp src: Axillary (10/09 0450) BP: 139/61 mmHg (10/09 0500) Pulse Rate: 68 (10/09 0500)  Labs:  Recent Labs  12/04/12 0009 12/04/12 0115 12/04/12 0345  HGB 12.7  --   --   HCT 40.4  --   --   PLT 133*  --   --   LABPROT  --   --  86.3*  INR  --   --  >10.00*  CREATININE 1.14*  --   --   CKTOTAL  --  39  --   CKMB  --  4.6*  --   TROPONINI  --   --  <0.30    Estimated Creatinine Clearance: 27.7 ml/min (by C-G formula based on Cr of 1.14).   Medical History: Past Medical History  Diagnosis Date  . Stroke   . Hypertension   . COPD (chronic obstructive pulmonary disease)   . Paroxysmal atrial fibrillation   . MI, old     Medications:  Prescriptions prior to admission  Medication Sig Dispense Refill  . amiodarone (PACERONE) 200 MG tablet Take 2 tablets (400 mg total) by mouth 2 (two) times daily. 2 tabs 2 x day for 1 week, then 2 tabs daily.  75 tablet  11  . cholecalciferol (VITAMIN D) 1000 UNITS tablet Take 2,000 Units by mouth daily.      . Cyanocobalamin 1000 MCG/15ML LIQD Take 1,000 mcg by mouth daily.      Marland Kitchen levalbuterol (XOPENEX) 0.63 MG/3ML nebulizer solution Take 3 mLs (0.63 mg total) by nebulization every 6 (six) hours as needed for wheezing.  3 mL  30  . nitroGLYCERIN (NITROSTAT) 0.4 MG SL tablet Place 1 tablet (0.4 mg total) under the tongue every 5 (five) minutes x 3 doses as needed for chest pain.  25 tablet  12  . nystatin (MYCOSTATIN/NYSTOP) 100000 UNIT/GM POWD Apply 1 g topically daily as needed (to  affected area).      . vitamin C (ASCORBIC ACID) 500 MG tablet Take 500 mg by mouth daily.      Marland Kitchen warfarin (COUMADIN) 1 MG tablet Take 1 tablet (1 mg total) by mouth daily at 6 PM. 1 mg Mon-Tues-Wed-Fri-Sat; 0.5 mg Thursday and Sunday  33 tablet  11    Assessment: 79 yo female admitted with AMS, likely sepsis, for empiric antibiotics.  Vancomycin 1 g IV given in ED at 0230.  Coumadin to continue for h/o Afib.  Goal of Therapy:  Vancomycin trough 15-20 INR 2-3 Monitor platelets by anticoagulation protocol: Yes   Plan:  Vancomycin 750 IV q48h Vitamin K 5 mg po now for INR > 10. INR this afternoon to reassess.  Dawn Barrett 12/04/2012,6:08 AM

## 2012-12-04 NOTE — Progress Notes (Signed)
INITIAL NUTRITION ASSESSMENT  DOCUMENTATION CODES Per approved criteria  -Severe malnutrition in the context of acute illness or injury   INTERVENTION: 1.  Modify diet; per SLP/MD discretion once medically appropriate.  NUTRITION DIAGNOSIS: Inadequate oral intake related to dysphagia, inability to eat as evidenced by dysphagia at baseline, currently NPO for safety.   Monitor:  1.  Food/Beverage; diet advancement if appropriate with pt meeting >/=90% estimated needs with tolerance. 2.  Wt/wt change; monitor trends  Reason for Assessment: Low Braden  79 y.o. female  Admitting Dx: Sepsis  ASSESSMENT: Pt admitted with AMS. Pt with h/o dysphagia from CVA 5 years ago.  Pt is on a pureed diet at baseline. Pt has been evaluated by SLP this AM who recommends NPO and interventions to improve swallow ability prior to objective assessment.  Family reports pt has been eating well.  She typically eats pureed foods at home prepared by her home cook who is aware of her nutritional concerns and needs.  Per family, pt likes to eat chicken and salmon as well as vegetables and applesauce.  They report her usual wt as 130 lbs.  She has recently been hospitalized and attribute some wt loss to admissions.   Nutrition Focused Physical Exam:  Subcutaneous Fat:  Orbital Region: severe wasting Upper Arm Region: moderate wasting Thoracic and Lumbar Region: N/A, pt unable to move in bed  Muscle:  Temple Region: moderate wasting Clavicle Bone Region: severe wasting Clavicle and Acromion Bone Region: severe wasting Scapular Bone Region: severe wasting Dorsal Hand: mild wasting Patellar Region:N/A, edema present Anterior Thigh Region: N/A, edema present Posterior Calf Region: N/A, edema present  Edema: lower extremity edema  Pt meets criteria for severe MALNUTRITION in the context of acute illness as evidenced by 6% wt loss in 2 weeks, subcutaneous fat and muscle loss.   Height: Ht Readings from  Last 1 Encounters:  12/04/12 5\' 5"  (1.651 m)    Weight: Wt Readings from Last 1 Encounters:  12/04/12 122 lb 12.7 oz (55.7 kg)    Ideal Body Weight: 125 lbs  % Ideal Body Weight: 97%  Wt Readings from Last 10 Encounters:  12/04/12 122 lb 12.7 oz (55.7 kg)  11/26/12 130 lb (58.968 kg)  11/21/12 128 lb 15.5 oz (58.5 kg)  08/05/12 120 lb 12.8 oz (54.795 kg)  02/09/10 130 lb (58.968 kg)  06/28/09 134 lb (60.782 kg)  11/23/08 135 lb (61.236 kg)    Usual Body Weight: variable, ~130 lbs  % Usual Body Weight: 93%  BMI:  Body mass index is 20.43 kg/(m^2).  Estimated Nutritional Needs: Kcal: 1350-1560 Protein: 55-66g Fluid: ~1.5 L/day  Skin: non-pitting edema  Diet Order: NPO  EDUCATION NEEDS: -Education needs addressed  No intake or output data in the 24 hours ending 12/04/12 1054  Last BM: PTA  Labs:   Recent Labs Lab 12/04/12 0009 12/04/12 0925  NA 153* 151*  K 4.7 4.2  CL 112 112  CO2 33* 32  BUN 46* 49*  CREATININE 1.14* 1.15*  CALCIUM 9.2 8.8  GLUCOSE 137* 125*    CBG (last 3)   Recent Labs  12/03/12 2335  GLUCAP 129*    Scheduled Meds: . amiodarone  400 mg Oral Daily  . antiseptic oral rinse  15 mL Mouth Rinse BID  . ceFEPime (MAXIPIME) IV  1 g Intravenous Q24H  . cholecalciferol  2,000 Units Oral Daily  . [START ON 12/06/2012] vancomycin  750 mg Intravenous Q48H  . vitamin B-12  1,000 mcg  Oral Daily  . vitamin C  500 mg Oral Daily    Continuous Infusions: . sodium chloride 75 mL/hr at 12/04/12 0640    Past Medical History  Diagnosis Date  . Stroke     a. L MCA CVA 2009.  Marland Kitchen Hypertension   . COPD (chronic obstructive pulmonary disease)   . Paroxysmal atrial fibrillation   . NSTEMI (non-ST elevated myocardial infarction)     a. 10/2012 - managed medically.  . Chronic combined systolic and diastolic CHF (congestive heart failure)   . Symptomatic bradycardia     a. s/p St. Jude pacemaker 2009.  Marland Kitchen PNA (pneumonia)     a. Per  notes, h/o pulm injury r/t PNA.  . LBBB (left bundle branch block)   . HOCM (hypertrophic obstructive cardiomyopathy)     a. 2D Echo 10/2012: EF 40-45%, mild LVH, mild-mod TR, mitral SAM with mod MR, LVOT gradient not well assessed but at least 2.60m/s; findings c/w HOCM.  Marland Kitchen Mitral regurgitation     a. Mod MR by echo 10/2012.  . Tricuspid regurgitation     a. Mild-mod TR by echo 10/2012.    Past Surgical History  Procedure Laterality Date  . Pacemaker insertion  11/19/2007    St. Jude Zephyr 5020 pulse generator, serial [id number]  . Femur surgery Right 06/2011    Rod inserted after a fall.     Loyce Dys, MS RD LDN Clinical Inpatient Dietitian Pager: [phone] Weekend/After hours pager: [phone]

## 2012-12-04 NOTE — ED Notes (Signed)
admiting dr at bed side, awaiting bed assignment

## 2012-12-04 NOTE — Progress Notes (Signed)
CRITICAL VALUE ALERT  Critical value received:  INR Date of notification: 12/04/2012  Time of notification:  1045  Critical value read back:yes  Nurse who received alert:  Jonetta Speak RN  MD notified (1st page):  Dr. Butler Denmark  Time of first page:  1045  MD notified (2nd page):  Time of second page:  Responding MD: Time MD responded:

## 2012-12-04 NOTE — Evaluation (Signed)
Clinical/Bedside Swallow Evaluation Patient Details  Name: Dawn Barrett MRN: [id number] Date of Birth: 13-Feb-1919  Today's Date: 12/04/2012 Time:  -     Past Medical History:  Past Medical History  Diagnosis Date  . Stroke     a. L MCA CVA 2009.  Marland Kitchen Hypertension   . COPD (chronic obstructive pulmonary disease)   . Paroxysmal atrial fibrillation   . NSTEMI (non-ST elevated myocardial infarction)     a. 10/2012 - managed medically.  . Chronic combined systolic and diastolic CHF (congestive heart failure)   . Symptomatic bradycardia     a. s/p St. Jude pacemaker 2009.  Marland Kitchen PNA (pneumonia)     a. Per notes, h/o pulm injury r/t PNA.  . LBBB (left bundle branch block)   . HOCM (hypertrophic obstructive cardiomyopathy)     a. 2D Echo 10/2012: EF 40-45%, mild LVH, mild-mod TR, mitral SAM with mod MR, LVOT gradient not well assessed but at least 2.75m/s; findings c/w HOCM.  Marland Kitchen Mitral regurgitation     a. Mod MR by echo 10/2012.  . Tricuspid regurgitation     a. Mild-mod TR by echo 10/2012.   Past Surgical History:  Past Surgical History  Procedure Laterality Date  . Pacemaker insertion  11/19/2007    St. Jude Zephyr 5020 pulse generator, serial [id number]  . Femur surgery Right 06/2011    Rod inserted after a fall.    HPI:  79 year old admitted  12/03/12 due to altered mental status.  Pt with increasing cough, poor po intake, and decreased responsiveness.  Recent hospitalization (9/29-36/14) due to respiratory difficulty.  PMH significnt for CVA (2009), dysphagia, HTN, COPD, MI, CHF, dehydration.  Head CT revealed nothing acute; CXR revealed improving bilateral opacities, and pleural effusions.  At baseline pt has "simple interaction" with caregivers.     Assessment / Plan / Recommendation Clinical Impression  Oral care completed with suction.  Thick secretions removed from posterior oral cavity.  Pt noted to bite oral swab/yankauer during oral care.  Pt has longstanding hx of dysphagia  following CVA in 2009.  She has been eating purees, soft foods,  and honey-thick liquids since hospitalization in September '14. Hired caregivers present during this assessment.  Limited po trials were given at this time.  Pt exhibited poor oral coordination with delayed posterior propulsion and suspected delayed swallow on ice chips and puree.  Pt had difficulty coordinating the act of taking ice chips and applesauce from the spoon, and required up to 60 seconds between presentation of bolus and trigger of swallow on each bolus tested.  Delayed wet voice quality was noted minutes after termination of po trials.  Discussed recommendations with caregivers (present) and daughter (via phone).  Given long standing history of dysphagia with risk of silent aspiration, repeat objective study is recommended prior to initiating po diet.  At this point, po intake is not functional for pt, based on bedside presentation.  ST to follow for improvement and readiness to complete MBS.  Of note, daughter requests that a minimum of barium be used during the MBS, due to pt inability to process it.      Aspiration Risk  Moderate    Diet Recommendation NPO   Medication Administration: Via alternative means    Other  Recommendations Recommended Consults: MBS Oral Care Recommendations: Oral care Q4 per protocol;Staff/trained caregiver to provide oral care   Follow Up Recommendations  24 hour supervision/assistance    Frequency and Duration min 1 x/week  1 week   Pertinent Vitals/Pain 1 on PAINAD scale    SLP Swallow Goals  Pt will participate in dx tx to determine appropriateness for po intake/objective study.   Swallow Study Prior Functional Status   Puree diet with honey thick liquids.     General Date of Onset: 12/03/12 HPI: 79 year old admitted  12/03/12 due to altered mental status.  Pt with increasing cough, poor po intake, and decreased responsiveness.  Recent hospitalization (9/29-36/14) due to  respiratory difficulty.  PMH significnt for CVA (2009), dysphagia, HTN, COPD, MI, CHF, dehydration.  Head CT revealed nothing acute; CXR revealed improving bilateral opacities, and pleural effusions.  At baseline pt has "simple interaction" with caregivers.   Type of Study: Bedside swallow evaluation Previous Swallow Assessment: BSE 11/18/12 - recommended Dys 2/NTL.  MBS 2009 - records not accessible Diet Prior to this Study: NPO Temperature Spikes Noted: No Respiratory Status: Nasal cannula (@2L ) History of Recent Intubation: No Behavior/Cognition: Alert;Decreased sustained attention;Doesn't follow directions;Requires cueing;Lethargic Oral Cavity - Dentition: Adequate natural dentition Self-Feeding Abilities: Needs assist Patient Positioning: Upright in bed Baseline Vocal Quality: Low vocal intensity Volitional Cough: Cognitively unable to elicit Volitional Swallow: Unable to elicit    Oral/Motor/Sensory Function Overall Oral Motor/Sensory Function: Appears within functional limits for tasks assessed   Ice Chips Ice chips: Impaired Presentation: Spoon Oral Phase Impairments: Impaired anterior to posterior transit Oral Phase Functional Implications: Prolonged oral transit Pharyngeal Phase Impairments: Suspected delayed Swallow;Decreased hyoid-laryngeal movement   Thin Liquid Thin Liquid: Not tested    Nectar Thick Nectar Thick Liquid: Not tested   Honey Thick Honey Thick Liquid: Not tested   Puree Puree: Impaired Presentation: Spoon Oral Phase Impairments: Impaired anterior to posterior transit;Poor awareness of bolus Oral Phase Functional Implications: Oral holding;Prolonged oral transit Pharyngeal Phase Impairments: Suspected delayed Swallow;Decreased hyoid-laryngeal movement;Wet Vocal Quality   Solid   GO   Celia B. Murvin Natal [facility], CCC-SLP [phone]  Solid: Not tested       Leigh Aurora 12/04/2012,10:36 AM

## 2012-12-04 NOTE — Consult Note (Signed)
Cardiology Consultation Note  Patient ID: [name], MRN: [id number], DOB/AGE: 1918/11/05 79 y.o. Admit date: 12/03/2012   Date of Consult: 12/04/2012 Primary Physician: Default, Provider, MD Primary Cardiologist: Graciela Husbands  Chief Complaint: cough Reason for Consult: elevated POC troponin, h/o CHF  HPI: [name] is a 79 y/o F with history of HTN, PAF (on Coumadin) with history of bradycardia s/p pacemaker, CVA 2009, COPD, chronic combined CHF, and NSTEMI who presented to [facility] 12/03/2012 with confusion. We are asked to see her for mildly elevated POC troponin and CHF. Note she had a recent admission 10/2012 for acute respiratory distress in the setting of pneumonia/CHF/UTI. She had elevated troponin at that time and was treated conservatively given age and prior CVA. She had followed up with Dr. Graciela Husbands on 10/1 and Lasix was discontinued due to weakness and suspicion for overdiuresis. At baseline, her caregivers say she generally doesn't talk much - she'll say a few words here and there, but doesn't speak in sentences. She spends most of the day in a recliner or wheelchair. She no longer ambulates.  The patient is unable to provide adequate history, but per caregivers, she hasn't done well since the last hospital admission. She's had a progressive cough and decreased PO intake. Yesterday she developed lethargy so they brought her to the ER. She has not complained of any pain. She was hypothermic on arrival at 95.4. She was found to be dehydrated with Na 153, BUN/Cr 46/1.14, elevated alk phos of 586 with AST/ALT 114/68, albumin 2.2, pBNP 5031. POC troponin was 0.18 but f/u full troponin was negative. WBC was 14.9. BP did temporarily drop into the 80's overnight but has remained stable in the 130s-140s the remainder of the night. CXR suggested residual airspace opacities possibly reflecting atelectasis or pneumonia. CT head was nonacute. PCCM has been consulted and recommended broad  spectrum antibiotics for possible HCAP/aspiration, a workup for biliary path based on elevated alk phos/LFTs, and gentle IV hydration. IV Vitamin K is also being given for INR >10 (Hgb stable, if not up likely due to dehydration concentration).  Past Medical History  Diagnosis Date  . Stroke     a. L MCA CVA 2009.  Marland Kitchen Hypertension   . COPD (chronic obstructive pulmonary disease)   . Paroxysmal atrial fibrillation   . NSTEMI (non-ST elevated myocardial infarction)     a. 10/2012 - managed medically.  . Chronic combined systolic and diastolic CHF (congestive heart failure)   . Symptomatic bradycardia     a. s/p St. Jude pacemaker 2009.  Marland Kitchen PNA (pneumonia)     a. Per notes, h/o pulm injury r/t PNA.  . LBBB (left bundle branch block)   . HOCM (hypertrophic obstructive cardiomyopathy)     a. 2D Echo 10/2012: EF 40-45%, mild LVH, mild-mod TR, mitral SAM with mod MR, LVOT gradient not well assessed but at least 2.46m/s; findings c/w HOCM.  Marland Kitchen Mitral regurgitation     a. Mod MR by echo 10/2012.  . Tricuspid regurgitation     a. Mild-mod TR by echo 10/2012.      Most Recent Cardiac Studies: 2D Echo 11/14/12 - Left ventricle: The cavity size was normal. Wall thickness was increased in a pattern of mild LVH. There was moderate focal basal hypertrophy of the septum. Systolic function was mildly to moderately reduced. The estimated ejection fraction was in the range of 40% to 45%. There is akinesis of the apical myocardium. Doppler parameters are consistent with abnormal left  ventricular relaxation (grade 1 diastolic dysfunction). Doppler parameters are consistent with high ventricular filling pressure. - Mitral valve: Calcified annulus. There was moderate systolic anterior motion of the anterior leaflet, posterior leaflet, and chordal structures. Moderate regurgitation. - Left atrium: The atrium was mildly dilated. - Tricuspid valve: Mild-moderate regurgitation. - Pulmonary arteries: Systolic  pressure was moderately increased. PA peak pressure: 61mm Hg (S). - Pericardium, extracardiac: A trivial pericardial effusion was identified. Impressions:  - Apical akinesis with hyperdynamic basal function; overall EF 40-45; trivial pericardial effusion; mitral SAMwith moderate MR; LVOT gradient not well assessed but at least 2.4 m/s; findings consistent with HOCM.    Surgical History:  Past Surgical History  Procedure Laterality Date  . Pacemaker insertion  11/19/2007    St. Jude Zephyr 5020 pulse generator, serial [id number]  . Femur surgery Right 06/2011    Rod inserted after a fall.      Home Meds: Prior to Admission medications   Medication Sig Start Date End Date Taking? Authorizing Provider  amiodarone (PACERONE) 200 MG tablet Take 2 tablets (400 mg total) by mouth 2 (two) times daily. 2 tabs 2 x day for 1 week, then 2 tabs daily. 11/21/12  Yes [name], PA-C  cholecalciferol (VITAMIN D) 1000 UNITS tablet Take 2,000 Units by mouth daily.   Yes Historical Provider, MD  Cyanocobalamin 1000 MCG/15ML LIQD Take 1,000 mcg by mouth daily.   Yes Historical Provider, MD  levalbuterol Pauline Aus) 0.63 MG/3ML nebulizer solution Take 3 mLs (0.63 mg total) by nebulization every 6 (six) hours as needed for wheezing. 11/21/12  Yes [name], PA-C  nitroGLYCERIN (NITROSTAT) 0.4 MG SL tablet Place 1 tablet (0.4 mg total) under the tongue every 5 (five) minutes x 3 doses as needed for chest pain. 11/21/12  Yes [name], PA-C  nystatin (MYCOSTATIN/NYSTOP) 100000 UNIT/GM POWD Apply 1 g topically daily as needed (to affected area).   Yes Historical Provider, MD  vitamin C (ASCORBIC ACID) 500 MG tablet Take 500 mg by mouth daily.   Yes Historical Provider, MD  warfarin (COUMADIN) 1 MG tablet Take 1 tablet (1 mg total) by mouth daily at 6 PM. 1 mg Mon-Tues-Wed-Fri-Sat; 0.5 mg Thursday and Sunday 11/21/12  Yes Joline Salt Barrett, PA-C    Inpatient Medications:  . amiodarone  400 mg  Oral BID   Followed by  . [START ON 12/05/2012] amiodarone  400 mg Oral Daily  . antiseptic oral rinse  15 mL Mouth Rinse BID  . ceFEPime (MAXIPIME) IV  1 g Intravenous Q24H  . cholecalciferol  2,000 Units Oral Daily  . phytonadione (VITAMIN K) IV  5 mg Intravenous Once  . [START ON 12/06/2012] vancomycin  750 mg Intravenous Q48H  . vitamin B-12  1,000 mcg Oral Daily  . vitamin C  500 mg Oral Daily   . sodium chloride 75 mL/hr at 12/04/12 0640    Allergies:  Allergies  Allergen Reactions  . Codeine Other (See Comments)    REACTION: unknown  . Sulfonamide Derivatives Other (See Comments)    REACTION:  unknown    History   Social History  . Marital Status: Widowed    Spouse Name: N/A    Number of Children: N/A  . Years of Education: N/A   Occupational History  . Retired    Social History Main Topics  . Smoking status: Never Smoker   . Smokeless tobacco: Not on file  . Alcohol Use: No  . Drug Use: No  . Sexual  Activity: Not on file   Other Topics Concern  . Not on file   Social History Narrative   Pt lives with daughter in [place] but has been in [place] recently.     Family History  Problem Relation Age of Onset  . Heart disease Mother      Review of Systems: unable to obtain due to mental status  Labs:  Recent Labs  12/04/12 0115 12/04/12 0345  CKTOTAL 39  --   CKMB 4.6*  --   TROPONINI  --  <0.30   Lab Results  Component Value Date   WBC 14.9* 12/04/2012   HGB 12.7 12/04/2012   HCT 40.4 12/04/2012   MCV 99.5 12/04/2012   PLT 133* 12/04/2012     Recent Labs Lab 12/04/12 0009  NA 153*  K 4.7  CL 112  CO2 33*  BUN 46*  CREATININE 1.14*  CALCIUM 9.2  PROT 8.2  BILITOT 1.1  ALKPHOS 586*  ALT 68*  AST 114*  GLUCOSE 137*   Radiology/Studies:  Dg Chest 2 View10/09/2012   *RADIOLOGY REPORT*  Clinical Data: Altered mental status.  CHEST - 2 VIEW  Comparison: Chest radiograph performed 11/19/2012  Findings: There has been interval  improvement in bilateral airspace opacities.  Persistent small to moderate bilateral pleural effusions are seen, with associated airspace opacities possibly reflecting atelectasis or residual pneumonia.  No pneumothorax is seen, though the lung apices are partially obscured by the patient's head.  The cardiomediastinal silhouette is mildly enlarged.  A pacemaker is seen at the left chest wall, with leads ending at the right atrium and right ventricle.  Chronic right-sided rib deformities are seen.  There is a relatively severe compression deformity at the mid to lower thoracic spine, new from 2013, though with a somewhat chronic appearance.  IMPRESSION:  1.  Interval improvement in bilateral airspace opacities; residual airspace opacities may reflect atelectasis or pneumonia. Persistent small to moderate bilateral pleural effusions seen. 2.  Mild cardiomegaly noted. 3.  Relatively severe compression deformity at the mid to lower thoracic spine, new from 2013, though with a somewhat chronic appearance.   Original Report Authenticated By: Tonia Ghent, M.D.   Ct Head Wo Contrast 12/04/2012   *RADIOLOGY REPORT*  Clinical Data: Altered mental status.  CT HEAD WITHOUT CONTRAST  Technique:  Contiguous axial images were obtained from the base of the skull through the vertex without contrast.  Comparison: CT of head October 27, 2007  Findings: Please note, as the patient is markedly kyphotic, initial scanning resulting in near coronal images, these were reformatted to the axial images. Moderately motion degraded examination.  No intraparenchymal hemorrhage, mass effect or midline shift.  Left frontal encephalomalacia with mineralization, corresponding to patient's known remote left middle cerebral artery territory infarct.  No hydrocephalous.  No midline shift or mass effect. Patchy to confluent supratentorial white matter hypodensities suggest sequelae of chronic small vessel ischemic disease.  No definite abnormal  extra-axial fluid collections.  Mild right maxillary mucosal thickening mild paranasal sinus air-fluid levels. No depressed skull fracture.  IMPRESSION: Motion and habitus limited examination.  No convincing evidence of acute intracranial process.  Remote left middle cerebral artery territory infarct.  Moderate to severe white matter changes suggest sequelae of chronic small vessel ischemic disease.   Original Report Authenticated By: Awilda Metro   EKG: NSR 95bpm LBBB , baseline wander present, nonspecific ST-T changes Tele: av paced, NSR  Physical Exam: Blood pressure 139/61, pulse 68, temperature 97.3 F (36.3 C),  temperature source Axillary, resp. rate 17, height 5\' 5"  (1.651 m), weight 122 lb 12.7 oz (55.7 kg), SpO2 100.00%. General: Frail ill appearing elderly WF in no acute distress. Head: Normocephalic, atraumatic, sclera non-icteric, no xanthomas, nares are without discharge.  Neck: JVD not elevated. Lungs: Course rhonchorous BS bilaterally. Pt does not cooperate with breathing instructions. Crackly occasional cough. Breathing is unlabored. Heart: RRR with S1 S2. No murmurs, rubs, or gallops appreciated. Abdomen: Soft, non-tender, non-distended with normoactive bowel sounds. No hepatomegaly. No rebound/guarding. No obvious abdominal masses. Msk:  Strength and tone appear normal for age. Extremities: No clubbing or cyanosis. No edema.  Distal pedal pulses are 2+ and equal bilaterally. Neuro: Nonverbal. She will follow simple commands by caregiver but does not follow my commands. She is awake (apparently more so than on admission) Psych:  Flat affect.   Assessment and Plan:   1. Suspected HCAP/possible aspiration pneumonia 2. Acute encephalopathy, with poor mental status at baseline, likely due to underlying illness 3. Elevated LFTs raising suspicion for GI involvement 4. Acute renal insufficiency due to dehydration 5. Hypernatremia due to dehydration 6. Coagulopathy due to  Coumadin in the setting of poor oral intake 7. Chronic systolic CHF EF 40-45% 8. Hypertrophic obstructive cardiomyopathy by echo 10/2012 9. Isolated elevated POC troponin, likely related to demand ischemia rather than MI 10. Paroxysmal atrial fibrillation, maintaining NSR 11. Moderate mitral regurgitation by echo 10/2012 12. Severe protein malnutrition with hypoalbuminemia 13. Symptomatic bradycardia s/p pacemaker, stable  POC troponin may reflect demand ischemia rather than ACS. F/u full troponin negative. As before, will continue to treat medically due to underlying comorbidities, poor functional status and advanced age. No need to cycle further troponins. Agree with gentle hydration with attention to volume status. Would keep amiodarone on board at home dose of 400mg  daily for now but follow LFTs in case they continue to rise. She is not on ACEI at this time - will not start at this time due to renal insuff/hypotension this admission. IM team is managing coagulopathy - no current evidence of bleeding. No further cardiac workup needed at this time. Will sign off. Please call with questions.  Signed, Dayna Dunn PA-C 12/04/2012, 6:51 AM   History and all data above reviewed.  Patient examined.  I agree with the findings as above.  The patient is well known to [facility].  She presents with failure to thrive secondary to advanced age with dehydration and possible CPA.  She is not responding this AM but she is in no acute distress.  We are called secondary to a very mildly elevated POC marker.  EKG is LBBB and telemetry with AV pacing.  Follow up cardiac markers are negative.  She is unable to give any history.  The patient exam reveals COR:RRR  ,  Lungs: Decreased breath sounds  ,  Abd: Decreased bowel sounds, Ext Mild extremity edema  .  All available labs, radiology testing, previous records reviewed. Agree with documented assessment and plan. Given the comorbid conditions no further testing is  indicated.  There is no evidence for an acute cardiac condition.  I did discuss her case with her daughter.  Please call us with further questions.    Fayrene Fearing Adelise Buswell  8:20 AM  12/04/2012

## 2012-12-04 NOTE — ED Notes (Signed)
Family at bedside. Patient given antibiotic

## 2012-12-04 NOTE — ED Notes (Signed)
Noted brusied on pt lt hand, this was noted below the iv site, pt came into ed with this bruise, notify floor of situation

## 2012-12-04 NOTE — Progress Notes (Signed)
Pt with increased peripheral edema, cough, no adventitious breath sounds. Pts home caregiver requesting reduction in IVF rate as pt has hx of CHF. NP [place] made aware. IVF decreased from 75-10 mL/hr

## 2012-12-04 NOTE — ED Notes (Signed)
MD at bedside. Awaiting bed assisment.

## 2012-12-05 ENCOUNTER — Telehealth: Payer: Self-pay | Admitting: Internal Medicine

## 2012-12-05 LAB — CBC
HCT: 32.1 % — ABNORMAL LOW (ref 36.0–46.0)
Hemoglobin: 10.4 g/dL — ABNORMAL LOW (ref 12.0–15.0)
MCH: 31.9 pg (ref 26.0–34.0)
MCHC: 32.4 g/dL (ref 30.0–36.0)
RBC: 3.26 MIL/uL — ABNORMAL LOW (ref 3.87–5.11)

## 2012-12-05 LAB — BASIC METABOLIC PANEL
BUN: 51 mg/dL — ABNORMAL HIGH (ref 6–23)
BUN: 49 mg/dL — ABNORMAL HIGH (ref 6–23)
CO2: 29 mEq/L (ref 19–32)
Calcium: 8.4 mg/dL (ref 8.4–10.5)
Chloride: 115 mEq/L — ABNORMAL HIGH (ref 96–112)
GFR calc Af Amer: 58 mL/min — ABNORMAL LOW (ref >90)
GFR calc non Af Amer: 50 mL/min — ABNORMAL LOW (ref >90)
Glucose, Bld: 84 mg/dL (ref 70–99)
Glucose, Bld: 113 mg/dL — ABNORMAL HIGH (ref 70–99)
Potassium: 4.3 mEq/L (ref 3.5–5.1)
Potassium: 3.6 mEq/L (ref 3.5–5.1)
Sodium: 154 mEq/L — ABNORMAL HIGH (ref 135–145)
Sodium: 152 mEq/L — ABNORMAL HIGH (ref 135–145)

## 2012-12-05 LAB — PROTIME-INR
INR: 1.98 — ABNORMAL HIGH (ref 0.00–1.49)
Prothrombin Time: 21.9 seconds — ABNORMAL HIGH (ref 11.6–15.2)

## 2012-12-05 LAB — HEPATIC FUNCTION PANEL
AST: 61 U/L — ABNORMAL HIGH (ref 0–37)
Albumin: 2 g/dL — ABNORMAL LOW (ref 3.5–5.2)
Bilirubin, Direct: 0.5 mg/dL — ABNORMAL HIGH (ref 0.0–0.3)

## 2012-12-05 LAB — LEGIONELLA ANTIGEN, URINE

## 2012-12-05 MED ORDER — WARFARIN - PHARMACIST DOSING INPATIENT: Freq: Every day | Status: DC

## 2012-12-05 MED ORDER — DEXTROSE 5 % IV SOLN
INTRAVENOUS | Status: DC
  Administered 2012-12-05: 1000 mL via INTRAVENOUS
  Administered 2012-12-06 – 2012-12-10 (×5): via INTRAVENOUS

## 2012-12-05 MED ORDER — VANCOMYCIN HCL 500 MG IV SOLR
500.0000 mg | INTRAVENOUS | Status: DC
  Administered 2012-12-05: 500 mg via INTRAVENOUS
  Filled 2012-12-05 (×3): qty 500

## 2012-12-05 MED ORDER — AMIODARONE HCL 100 MG PO TABS
100.0000 mg | ORAL_TABLET | Freq: Every day | ORAL | Status: DC
  Administered 2012-12-06 – 2012-12-11 (×6): 100 mg via ORAL
  Filled 2012-12-05 (×7): qty 1

## 2012-12-05 MED ORDER — WARFARIN 0.5 MG HALF TABLET
0.5000 mg | ORAL_TABLET | Freq: Once | ORAL | Status: AC
  Administered 2012-12-05: 0.5 mg via ORAL
  Filled 2012-12-05 (×2): qty 1

## 2012-12-05 NOTE — Telephone Encounter (Signed)
New message   Pt in hosp---concerned about fluid overload--family want chest xray

## 2012-12-05 NOTE — Progress Notes (Signed)
Speech Language Pathology Treatment: Dysphagia  Patient Details Name: Dawn Barrett MRN: [id number] DOB: 1918-10-24 Today's Date: 12/05/2012 Time: 0815-0830 SLP Time Calculation (min): 15 min  Assessment / Plan / Recommendation Clinical Impression  Treatment focused on diagnostic po trials to determine readiness for a po diet. Patient weak but alert and able to participate. Oral care complete by SLP. Po trials provided (ice chips). Patient presented with improved awareness of bolus, oral control, and oral transit time as compared to initial evaluation 10/9. Swallow initiation delayed however with therapist providing moderate tactile cueing for completion. Wet vocal quality and delayed throat clear/coughing remain present indicative of decreased airway protection. Although patient making small gains, aspiration risk high at this time. General gains in strength needed to be a candidate for initiation of a full po diet and/or completion of repeat MBS. SLP will continue to f/u.       Pertinent Vitals n/a  SLP Plan  Continue with current plan of care    Recommendations Diet recommendations: NPO (except ice chips after oral care) Medication Administration: Via alternative means              Oral Care Recommendations: Oral care Q4 per protocol;Oral care prior to ice chips Plan: Continue with current plan of care    GO   [facility] MA, CCC-SLP [phone]   Dawn Barrett 12/05/2012, 8:33 AM

## 2012-12-05 NOTE — Progress Notes (Signed)
[facility] Progress Note [place] TEAM 1 - Stepdown ICU Team   PEG FIFER FAO:[id number] DOB: 04-Jun-1918 DOA: 12/03/2012 PCP: Default, Provider, MD  Brief narrative:  Assessment/Plan: Active Problems:   Sepsis -suspected due to aspiration pneumonia -follow up on all cx's  Acute on chronic respiratory failure with hypoxia due to:   A) Aspiration pneumonia   B) COPD -per dtr on chronic O2 at 2.5 L same dose here -d/w dtr rationale for not continuing scheduled q 4 hour Xopenex nebs while here since not actively wheezing- use prn only -cont empiric anbx to cover for aspiration - recent CXR shows improvement when compared to most recent during last admit last month -doubt CHF exacerbation since is dehydrated so holding Lasix -has developed peripheral edema after hydration so suspect due to third spacing from low albumin and not HF    Acute encephalopathy/ lethargic -metabolic etiology due to hypernatremia-Na up to 154 noting IVF rate decreased in setting of peripheral edema overnight -ck'd urine cx despite benign UA- was on anbx's pre admit which could influence UA appearance    Dehydration with hypernatremia -Na back up after IVF rate decreased overnite so will resume at 50 and change to D5W -follow lytes    Elevated LFTs -probably due to low perfusion from [facility] but could be from Amiodarone- follow trends -AP markedly elevated- likely due to osteoporosis- Ca also was elevated and decreased with fluids    Protein-calorie malnutrition, severe/known dysphagia after CVA -SLP has evaluated pt this admission but may need to defer add'l eval (MBSS) until Na normalized -will allow crushed meds only    Elevated troponin -due to [facility]    Supratherapeutic INR/Long term (current) use of anticoagulants -multifactorial: hypo albunemia, DH and recent use of anbx's -was given Vit K since initial INR >10 -d/w dtr 10/9 need to consider stopping Coumadin given pt's advanced  age -repeat INR now <2.0 so will ask pharmacist to begin dosing Coumadin-d/w pharmacist/due to presentation rec holding Lovenox until INR < 1.8 but OK to resume Coumadin    Paroxysmal atrial fibrillation/H/O cardiac pacemaker -Cards following -currently rate controlled -cannot resume meds per SLP -dtr says pt has significant hypotension with BB in past    Hypertrophic obstructive cardiomyopathy(425.11)/Mitral regurgitation -Cards following -in future likely will use Lasix prn fluid weight gain and not scheduled   Bilateral conjunctivitis -on Tobra eye gtts pre admit   DVT prophylaxis: Supratherapeutic INR-consider SCDs once INR subtherapeutic Code Status: DO NOT RESUSCITATE Family Communication: Spoke with daughter Rod Holler [phone] (10/9) Disposition Plan/Expected LOS: Transfer to floor Isolation: None Nutritional Status: Acute on chronic protein calorie malnutrition as evidenced by low albumin and low body weight in setting of patient with known dysphagia post CVA  Consultants: Cardiology  Procedures: None  Antibiotics: Unasyn times one dose in the emergency department Cefepime 10/9 >>> Vancomycin 10/8 >>>  HPI/Subjective: Patient less lethargic and basically awake- apparently talking with usual caregivers prior to our arrival.  Objective: Blood pressure 98/46, pulse 69, temperature 97.5 F (36.4 C), temperature source Axillary, resp. rate 19, height 5\' 5"  (1.651 m), weight 57 kg (125 lb 10.6 oz), SpO2 99.00%.  Intake/Output Summary (Last 24 hours) at 12/05/12 1059 Last data filed at 12/05/12 0817  Gross per 24 hour  Intake 794.58 ml  Output      0 ml  Net 794.58 ml     Exam: General: No acute respiratory distress Lungs: Clear to auscultation bilaterally without wheezes or crackles, 2.5 L Cardiovascular: Regular rate  and rhythm (alt between paced and NSR) without murmur gallop or rub normal S1 and S2, 1-2+ lower extremity peripheral edema without JVD Abdomen:  Nontender, nondistended, soft, bowel sounds positive, no rebound, no ascites, no appreciable mass Musculoskeletal: No significant cyanosis, clubbing of bilateral lower extremities Neurological: Awake, limited eye contact, moves all extremities x 4  Weakly without definitive  focal neurological deficits, CN 2-12 grossly intact   Scheduled Meds:  Scheduled Meds: . amiodarone  100 mg Oral Daily  . antiseptic oral rinse  15 mL Mouth Rinse BID  . ceFEPime (MAXIPIME) IV  1 g Intravenous Q24H  . tobramycin-dexamethasone  1 drop Both Eyes Q6H  . [START ON 12/06/2012] vancomycin  750 mg Intravenous Q48H   Continuous Infusions: . dextrose 1,000 mL (12/05/12 0910)    **Reviewed in detail by the Attending Physicia  Data Reviewed: Basic Metabolic Panel:  Recent Labs Lab 12/04/12 0009 12/04/12 0925 12/05/12 0842  NA 153* 151* 154*  K 4.7 4.2 4.3  CL 112 112 115*  CO2 33* 32 30  GLUCOSE 137* 125* 84  BUN 46* 49* 51*  CREATININE 1.14* 1.15* 0.95  CALCIUM 9.2 8.8 8.4   Liver Function Tests:  Recent Labs Lab 12/04/12 0009 12/05/12 0500  AST 114* 61*  ALT 68* 49*  ALKPHOS 586* 452*  BILITOT 1.1 1.2  PROT 8.2 7.4  ALBUMIN 2.2* 2.0*   No results found for this basename: LIPASE, AMYLASE,  in the last 168 hours No results found for this basename: AMMONIA,  in the last 168 hours CBC:  Recent Labs Lab 12/04/12 0009 12/04/12 0925 12/05/12 0842  WBC 14.9* 14.9* 12.4*  NEUTROABS 11.5*  --   --   HGB 12.7 10.3* 10.4*  HCT 40.4 32.0* 32.1*  MCV 99.5 98.5 98.5  PLT 133* 124* 140*   Cardiac Enzymes:  Recent Labs Lab 12/04/12 0115 12/04/12 0345  CKTOTAL 39  --   CKMB 4.6*  --   TROPONINI  --  <0.30   BNP (last 3 results)  Recent Labs  11/14/12 0928 11/26/12 1647 12/04/12 0009  PROBNP 3007.0* 254.0* 5031.0*   CBG:  Recent Labs Lab 12/03/12 2335  GLUCAP 129*    Recent Results (from the past 240 hour(s))  MRSA PCR SCREENING     Status: None   Collection Time      12/04/12  5:17 AM      Result Value Range Status   MRSA by PCR NEGATIVE  NEGATIVE Final   Comment:            The GeneXpert MRSA Assay (FDA     approved for NASAL specimens     only), is one component of a     comprehensive MRSA colonization     surveillance program. It is not     intended to diagnose MRSA     infection nor to guide or     monitor treatment for     MRSA infections.  CULTURE, BLOOD (ROUTINE X 2)     Status: None   Collection Time    12/04/12  9:00 AM      Result Value Range Status   Specimen Description BLOOD LEFT ANTECUBITAL   Final   Special Requests BOTTLES DRAWN AEROBIC AND ANAEROBIC 10CC   Final   Culture  Setup Time     Final   Value: 12/04/2012 16:53     Performed at [facility]   Culture     Final   Value:  BLOOD CULTURE RECEIVED NO GROWTH TO DATE CULTURE WILL BE HELD FOR 5 DAYS BEFORE ISSUING A FINAL NEGATIVE REPORT     Performed at [facility]   Report Status PENDING   Incomplete  CULTURE, BLOOD (ROUTINE X 2)     Status: None   Collection Time    12/04/12  9:25 AM      Result Value Range Status   Specimen Description BLOOD HAND RIGHT   Final   Special Requests BOTTLES DRAWN AEROBIC ONLY 5CC   Final   Culture  Setup Time     Final   Value: 12/04/2012 16:53     Performed at [facility]   Culture     Final   Value:        BLOOD CULTURE RECEIVED NO GROWTH TO DATE CULTURE WILL BE HELD FOR 5 DAYS BEFORE ISSUING A FINAL NEGATIVE REPORT     Performed at [facility]   Report Status PENDING   Incomplete     Studies:  Recent x-ray studies have been reviewed in detail by the Attending Physician    Junious Silk, ANP [facility] Office  [phone] Pager [phone]  **If unable to reach the above provider after paging please contact the Flow Manager @ [phone]  On-Call/Text Page:      [email or web address]      password [email or web address]  If 7PM-7AM, please contact night-coverage [email or web address] Password  [email or web address] 12/05/2012, 10:59 AM   LOS: 2 days    I have examined the patient, reviewed the chart and modified the above note which I agree with.   Takesha Steger,MD [phone] 12/05/2012, 12:51 PM

## 2012-12-05 NOTE — Progress Notes (Signed)
Daughter called expressing concern about pt. Receiving chest x-ray due to edema in legs. Paged MD. MD plans to not do chest x-ray. Plan is to follow up with cardiologist once discharged.

## 2012-12-05 NOTE — Progress Notes (Addendum)
ANTICOAGULATION CONSULT NOTE - Follow Up  Pharmacy Consult for Warfarin, vancomycin   Indication: Atrial fibrillation and sepsis  Allergies  Allergen Reactions  . Codeine Other (See Comments)    REACTION: unknown  . Sulfonamide Derivatives Other (See Comments)    REACTION:  unknown   Patient Measurements: Height: 5\' 5"  (165.1 cm) Weight: 125 lb 10.6 oz (57 kg) IBW/kg (Calculated) : 57  Vital Signs: Temp: 97.5 F (36.4 C) (10/10 0732) Temp src: Axillary (10/10 0732) BP: 98/46 mmHg (10/10 0900) Pulse Rate: 69 (10/10 0900)  Labs:  Recent Labs  12/04/12 0009 12/04/12 0115  12/04/12 0345 12/04/12 0925 12/04/12 1540 12/05/12 0500 12/05/12 0842  HGB 12.7  --   --   --  10.3*  --   --  10.4*  HCT 40.4  --   --   --  32.0*  --   --  32.1*  PLT 133*  --   --   --  124*  --   --  140*  LABPROT  --   --   < > 86.3* 75.7* 39.8* 21.9*  --   INR  --   --   < > >10.00* >10.00* 4.33* 1.98*  --   CREATININE 1.14*  --   --   --  1.15*  --   --  0.95  CKTOTAL  --  39  --   --   --   --   --   --   CKMB  --  4.6*  --   --   --   --   --   --   TROPONINI  --   --   --  <0.30  --   --   --   --   < > = values in this interval not displayed.  Estimated Creatinine Clearance: 34 ml/min (by C-G formula based on Cr of 0.95).   Assessment: 79 yo female admitted with AMS, likely sepsis on antibiotics.  Patient also noted on coumadin PTA and admit INR > 10 (s/p vitamin K 5mg  IV on 10/9) with todays INR= 1.98 (down from 4.33 10/10). Hg= 10.4, plt =140.  Patient to resume coumadin today.  Lovenox ordered and after discussion with Junious Silk NP will begin if INR < 1.8)  Home coumadin dose: 1mg  MTuWeFrSa, 0.5mg  TSu  Patient also noted on cefepime/vancomycin for aspiration PNA. SCr= 0.95 abd CrCl ~35.   10/9 Vanc>> (last dose on 10/16) 10/9 Cefepime>> (10/17)  10/9 blood x2>> ngtd    Goal of Therapy:  Vancomycin trough 15-20 INR 2-3 Monitor platelets by anticoagulation protocol:  Yes   Plan:  -Will begin coumadin at 0.5mg  po x1 (will need to be cautious with INR > 10 at admit however patient also s/p vitamin K) -Begin lovenox if INR < 1.8 -Daily PT/INR -Change vancomycin to 500mg  IV q24hr   Harland German, Pharm D 12/05/2012 11:52 AM

## 2012-12-05 NOTE — Telephone Encounter (Signed)
Discussed circumstances with Rod Holler, patient is still in hospital. I explained Dr. Graciela Husbands is not in office today to aid in this order. I advised her to discuss their concerns with the doctor in charge of her care in the hospital. Patient's daughter Rod Holler verbalized understanding and agreeable to plan.

## 2012-12-06 LAB — BASIC METABOLIC PANEL
BUN: 39 mg/dL — ABNORMAL HIGH (ref 6–23)
CO2: 30 mEq/L (ref 19–32)
Calcium: 8.4 mg/dL (ref 8.4–10.5)
Chloride: 114 mEq/L — ABNORMAL HIGH (ref 96–112)
Creatinine, Ser: 0.79 mg/dL (ref 0.50–1.10)
GFR calc Af Amer: 81 mL/min — ABNORMAL LOW (ref >90)
GFR calc non Af Amer: 70 mL/min — ABNORMAL LOW (ref >90)
Glucose, Bld: 123 mg/dL — ABNORMAL HIGH (ref 70–99)
Potassium: 3.3 mEq/L — ABNORMAL LOW (ref 3.5–5.1)
Sodium: 151 mEq/L — ABNORMAL HIGH (ref 135–145)

## 2012-12-06 LAB — PROTIME-INR
INR: 1.5 — ABNORMAL HIGH (ref 0.00–1.49)
Prothrombin Time: 17.7 seconds — ABNORMAL HIGH (ref 11.6–15.2)

## 2012-12-06 LAB — URINE CULTURE
Colony Count: NO GROWTH
Culture: NO GROWTH

## 2012-12-06 LAB — CBC
HCT: 34.6 % — ABNORMAL LOW (ref 36.0–46.0)
Hemoglobin: 10.8 g/dL — ABNORMAL LOW (ref 12.0–15.0)
MCV: 98.9 fL (ref 78.0–100.0)
Platelets: 97 10*3/uL — ABNORMAL LOW (ref 150–400)
RBC: 3.5 MIL/uL — ABNORMAL LOW (ref 3.87–5.11)
RDW: 16.4 % — ABNORMAL HIGH (ref 11.5–15.5)
WBC: 8.8 10*3/uL (ref 4.0–10.5)

## 2012-12-06 MED ORDER — ENOXAPARIN SODIUM 60 MG/0.6ML ~~LOC~~ SOLN
1.0000 mg/kg | Freq: Two times a day (BID) | SUBCUTANEOUS | Status: DC
  Administered 2012-12-06 – 2012-12-08 (×5): 55 mg via SUBCUTANEOUS
  Filled 2012-12-06 (×9): qty 0.6

## 2012-12-06 MED ORDER — AMOXICILLIN-POT CLAVULANATE 500-125 MG PO TABS
1.0000 | ORAL_TABLET | Freq: Two times a day (BID) | ORAL | Status: DC
  Administered 2012-12-06 – 2012-12-11 (×11): 500 mg via ORAL
  Filled 2012-12-06 (×14): qty 1

## 2012-12-06 MED ORDER — WARFARIN SODIUM 1 MG PO TABS
1.0000 mg | ORAL_TABLET | Freq: Once | ORAL | Status: AC
  Administered 2012-12-06: 1 mg via ORAL
  Filled 2012-12-06: qty 1

## 2012-12-06 NOTE — Progress Notes (Signed)
ANTICOAGULATION CONSULT NOTE - Follow Up  Pharmacy Consult for Warfarin + lovenox Indication: Atrial fibrillation  Allergies  Allergen Reactions  . Codeine Other (See Comments)    REACTION: unknown  . Sulfonamide Derivatives Other (See Comments)    REACTION:  unknown   Patient Measurements: Height: 5\' 5"  (165.1 cm) Weight: 125 lb 10.6 oz (57 kg) IBW/kg (Calculated) : 57  Vital Signs: Temp: 97.5 F (36.4 C) (10/11 0621) Temp src: Oral (10/11 0621) BP: 148/64 mmHg (10/11 0621) Pulse Rate: 70 (10/11 0621)  Labs:  Recent Labs  12/04/12 0009 12/04/12 0115  12/04/12 0345 12/04/12 0925 12/04/12 1540 12/05/12 0500 12/05/12 0842 12/05/12 1618 12/06/12 0700  HGB 12.7  --   --   --  10.3*  --   --  10.4*  --   --   HCT 40.4  --   --   --  32.0*  --   --  32.1*  --   --   PLT 133*  --   --   --  124*  --   --  140*  --   --   LABPROT  --   --   < > 86.3* 75.7* 39.8* 21.9*  --   --  17.7*  INR  --   --   < > >10.00* >10.00* 4.33* 1.98*  --   --  1.50*  CREATININE 1.14*  --   --   --  1.15*  --   --  0.95 0.95  --   CKTOTAL  --  39  --   --   --   --   --   --   --   --   CKMB  --  4.6*  --   --   --   --   --   --   --   --   TROPONINI  --   --   --  <0.30  --   --   --   --   --   --   < > = values in this interval not displayed.  Estimated Creatinine Clearance: 34 ml/min (by C-G formula based on Cr of 0.95).  Assessment: 79 yo female admitted with AMS, likely sepsis on antibiotics.  Patient also noted on coumadin PTA and admit INR > 10 (s/p vitamin K 5mg  IV on 10/9) with todays INR= 1.5 (trending down). Warfarin resumed yesterday (Home coumadin dose: 1mg  MTuWeFrSa, 0.5mg  TSu)  Goal of Therapy:  INR 2-3 Monitor platelets by anticoagulation protocol: Yes   Plan:  1. Coumadin 1mg  PO x 1 tonight 2. F/u AM INR 3. Start lovenox 55mg  SQ Q12H per discussion with NP 4. CBC Q72H while on lovenox  Lysle Pearl, PharmD, BCPS Pager # [phone] 12/06/2012 8:35 AM

## 2012-12-06 NOTE — Progress Notes (Signed)
Speech Language Pathology Treatment: Dysphagia  Patient Details Name: KIYONNA TORTORELLI MRN: [id number] DOB: 08-14-1918 Today's Date: 12/06/2012 Time: 4098-1191 SLP Time Calculation (min): 32 min  Assessment / Plan / Recommendation Clinical Impression  Pt seen with caregiver present. Caregiver concerned that pt wants to eat/drink. SLP provided teaspoons of honey and pudding/puree, all with overt evidence of aspiration following each, despite timely oral manipulation and only mildly delayed swallow response. Pt obviously enjoys eating and wants to eat, though risk is high. Discussed with MD who feels objective testing would have little benefit at this point, SLP in agreement. MD having conversation with daughter regarding POs with known risk.   Recommend Dys 1/pudding thick liquids with probable aspiration events. Discussed importance of posture, good awareness of PO and oral care before and after meals. Will defer decision to daughter and MD.    HPI     Pertinent Vitals NA  SLP Plan  Continue with current plan of care    Recommendations Diet recommendations: Dysphagia 1 (puree);Pudding-thick liquid Liquids provided via: Teaspoon Medication Administration: Crushed with puree Supervision: Full supervision/cueing for compensatory strategies Compensations: Slow rate;Small sips/bites Postural Changes and/or Swallow Maneuvers: Seated upright 90 degrees              Oral Care Recommendations: Oral care before and after PO Plan: Continue with current plan of care    GO    [facility], MA CCC-SLP [phone]  Claudine Mouton 12/06/2012, 9:56 AM

## 2012-12-06 NOTE — Progress Notes (Signed)
[facility] PROGRESS NOTE  Dawn Barrett YNW:[id number] DOB: 09-Sep-1918 DOA: 12/03/2012 PCP: Default, Provider, MD  Assessment/Plan: 1. Acute on chronic respiratory failure. Likely secondary to aspiration ammonia and probable superimposed chronic obstructive pulmonary disease exacerbation. Patient with history of CVA, vascular dementia, as well as history of aspiration. She was started on empiric IV antimicrobial therapy, given supplemental oxygen, supportive care. 2. Acute encephalopathy. Patient having a steep functional decline, likely multifactorial with underlying infectious process, dehydration and hypernatremia contributing. Today she appears awake, taken in some by mouth. 3. Dehydration. Patient having minimal by mouth intake, likely secondary to steep functional decline as well as underlying infectious process. She received IV fluid resuscitation during this hospitalization. 4. Aspiration. Patient was evaluated by speech pathology, had signs of overt aspiration. I discussed this with her daughter over telephone conversation. She agreed him not pursuing further invasive interventions such as feeding tubes or artificial nutrition. Understanding the there is a risk for future aspirations, she agreed with restarting nectar thick, and focusing care on comfort. 5. Severe protein calorie malnutrition. Patient with history of dysphagia following CVA, chronically aspirating, underlying vascular dementia, will continue providing supportive care, nectar thickened fluids. 6. Paroxysmal atrial fibrillation. Patient on anticoagulation, pharmacy consulted for Coumadin dosing. She initially presented with a supratherapeutic INR, now down to 1.5. Follow pharmacist recommendations. 7. Goals of care. I had a discussion with patient's daughter over telephone conversation regarding goals of care. She expressed her concerns for patient "starving to death" and did not feel that further swallow evaluations  would be helpful as patient is known to chronically aspirate. We talked about minimizing future potential invasive procedures, including the avoidance of artificial nutrition and tube feeds. Her daughter agreed with advance her diet today restarting dysphasia 1 nectar thick liquids, providing supportive care, and seeing how she does in the next 24 hours. She verbalizes understanding about the potential consequences of future aspirations.   Code Status: DO NOT RESUSCITATE Family Communication: Plan Will discuss with patient's daughter over telephone conversation Disposition Plan: Monitor patient for the next 24 hours, anticipate discharge tomorrow if she remains stable   Consultants:  Cardiology  Pulmonary critical care   Antibiotics:  Vancomycin, discontinue 12/06/2012  Cefepime, discontinue 12/06/2012  Augmentin, starting 12/06/2012  HPI/Subjective: Patient is awake, work with speech pathology this morning. She was found to have overt signs of aspiration to pudding. I discussed case with her daughter over telephone conversation, who would like for [place] to continue dysphagia 1 diet understanding the risks of aspiration. Daughter wishing to focus her care on comfort, and symptomatic management.   Objective: Filed Vitals:   12/06/12 0621  BP: 148/64  Pulse: 70  Temp: 97.5 F (36.4 C)  Resp: 18    Intake/Output Summary (Last 24 hours) at 12/06/12 1019 Last data filed at 12/05/12 2200  Gross per 24 hour  Intake    400 ml  Output      0 ml  Net    400 ml   Filed Weights   12/04/12 0500 12/05/12 0500  Weight: 55.7 kg (122 lb 12.7 oz) 57 kg (125 lb 10.6 oz)    Exam:   General:  Thin-appearing, chronically ill, however awake. Nonverbal.   Cardiovascular: 2/6 systolic ejection murmur, regular rate and rhythm normal S1-S2  Respiratory: By basilar crackles noted, scattered expiratory wheezes, normal respiratory effort on supplemental oxygen  Abdomen: Soft nontender  nondistended positive bowel sounds  Extremities: Patient having 1+ bilateral extremity pitting edema  Data Reviewed: Basic  Metabolic Panel:  Recent Labs Lab 12/04/12 0009 12/04/12 0925 12/05/12 0842 12/05/12 1618  NA 153* 151* 154* 152*  K 4.7 4.2 4.3 3.6  CL 112 112 115* 114*  CO2 33* 32 30 29  GLUCOSE 137* 125* 84 113*  BUN 46* 49* 51* 49*  CREATININE 1.14* 1.15* 0.95 0.95  CALCIUM 9.2 8.8 8.4 8.4   Liver Function Tests:  Recent Labs Lab 12/04/12 0009 12/05/12 0500  AST 114* 61*  ALT 68* 49*  ALKPHOS 586* 452*  BILITOT 1.1 1.2  PROT 8.2 7.4  ALBUMIN 2.2* 2.0*   No results found for this basename: LIPASE, AMYLASE,  in the last 168 hours No results found for this basename: AMMONIA,  in the last 168 hours CBC:  Recent Labs Lab 12/04/12 0009 12/04/12 0925 12/05/12 0842 12/06/12 0700  WBC 14.9* 14.9* 12.4* 8.8  NEUTROABS 11.5*  --   --   --   HGB 12.7 10.3* 10.4* 10.8*  HCT 40.4 32.0* 32.1* 34.6*  MCV 99.5 98.5 98.5 98.9  PLT 133* 124* 140* PENDING   Cardiac Enzymes:  Recent Labs Lab 12/04/12 0115 12/04/12 0345  CKTOTAL 39  --   CKMB 4.6*  --   TROPONINI  --  <0.30   BNP (last 3 results)  Recent Labs  11/14/12 0928 11/26/12 1647 12/04/12 0009  PROBNP 3007.0* 254.0* 5031.0*   CBG:  Recent Labs Lab 12/03/12 2335  GLUCAP 129*    Recent Results (from the past 240 hour(s))  MRSA PCR SCREENING     Status: None   Collection Time    12/04/12  5:17 AM      Result Value Range Status   MRSA by PCR NEGATIVE  NEGATIVE Final   Comment:            The GeneXpert MRSA Assay (FDA     approved for NASAL specimens     only), is one component of a     comprehensive MRSA colonization     surveillance program. It is not     intended to diagnose MRSA     infection nor to guide or     monitor treatment for     MRSA infections.  CULTURE, BLOOD (ROUTINE X 2)     Status: None   Collection Time    12/04/12  9:00 AM      Result Value Range Status    Specimen Description BLOOD LEFT ANTECUBITAL   Final   Special Requests BOTTLES DRAWN AEROBIC AND ANAEROBIC 10CC   Final   Culture  Setup Time     Final   Value: 12/04/2012 16:53     Performed at [facility]   Culture     Final   Value:        BLOOD CULTURE RECEIVED NO GROWTH TO DATE CULTURE WILL BE HELD FOR 5 DAYS BEFORE ISSUING A FINAL NEGATIVE REPORT     Performed at [facility]   Report Status PENDING   Incomplete  CULTURE, BLOOD (ROUTINE X 2)     Status: None   Collection Time    12/04/12  9:25 AM      Result Value Range Status   Specimen Description BLOOD HAND RIGHT   Final   Special Requests BOTTLES DRAWN AEROBIC ONLY 5CC   Final   Culture  Setup Time     Final   Value: 12/04/2012 16:53     Performed at [facility]  Final   Value:        BLOOD CULTURE RECEIVED NO GROWTH TO DATE CULTURE WILL BE HELD FOR 5 DAYS BEFORE ISSUING A FINAL NEGATIVE REPORT     Performed at [facility]   Report Status PENDING   Incomplete  URINE CULTURE     Status: None   Collection Time    12/04/12 10:45 PM      Result Value Range Status   Specimen Description URINE, CATHETERIZED   Final   Special Requests NONE   Final   Culture  Setup Time     Final   Value: 12/05/2012 05:05     Performed at [facility] Count     Final   Value: NO GROWTH     Performed at [facility]   Culture     Final   Value: NO GROWTH     Performed at [facility]   Report Status 12/06/2012 FINAL   Final     Studies: No results found.  Scheduled Meds: . amiodarone  100 mg Oral Daily  . amoxicillin-clavulanate  1 tablet Oral BID  . antiseptic oral rinse  15 mL Mouth Rinse BID  . enoxaparin (LOVENOX) injection  1 mg/kg Subcutaneous Q12H  . tobramycin-dexamethasone  1 drop Both Eyes Q6H  . warfarin  1 mg Oral ONCE-1800  . Warfarin - Pharmacist Dosing Inpatient   Does not apply q1800   Continuous Infusions: . dextrose 50 mL/hr at  12/06/12 0701    Principal Problem:   Sepsis Active Problems:   Long term (current) use of anticoagulants   Paroxysmal atrial fibrillation   Aspiration pneumonia   Acute encephalopathy   Dehydration   Elevated LFTs   Hypertrophic obstructive cardiomyopathy(425.11)   Mitral regurgitation   Protein-calorie malnutrition, severe   H/O cardiac pacemaker   Elevated troponin   Acute respiratory failure with hypoxia   Supratherapeutic INR    Time spent: 35 minutes    Jeralyn Bennett  [facility] Pager [phone] 7PM-7AM, please contact night-coverage at [email or web address], password [facility] 12/06/2012, 10:19 AM  LOS: 3 days

## 2012-12-06 NOTE — Progress Notes (Signed)
Pt's daughter would like a consult for placement of G-tube before discharge from hospital.

## 2012-12-07 DIAGNOSIS — R633 Feeding difficulties: Secondary | ICD-10-CM

## 2012-12-07 DIAGNOSIS — Z7901 Long term (current) use of anticoagulants: Secondary | ICD-10-CM

## 2012-12-07 LAB — BASIC METABOLIC PANEL
CO2: 32 mEq/L (ref 19–32)
Calcium: 8.2 mg/dL — ABNORMAL LOW (ref 8.4–10.5)
Chloride: 110 mEq/L (ref 96–112)
Creatinine, Ser: 0.81 mg/dL (ref 0.50–1.10)
Glucose, Bld: 118 mg/dL — ABNORMAL HIGH (ref 70–99)
Potassium: 3 mEq/L — ABNORMAL LOW (ref 3.5–5.1)

## 2012-12-07 LAB — CBC
HCT: 30.6 % — ABNORMAL LOW (ref 36.0–46.0)
Hemoglobin: 9.6 g/dL — ABNORMAL LOW (ref 12.0–15.0)
MCH: 31 pg (ref 26.0–34.0)
MCV: 98.7 fL (ref 78.0–100.0)
Platelets: 94 10*3/uL — ABNORMAL LOW (ref 150–400)
RBC: 3.1 MIL/uL — ABNORMAL LOW (ref 3.87–5.11)
WBC: 6.6 10*3/uL (ref 4.0–10.5)

## 2012-12-07 MED ORDER — WARFARIN SODIUM 1 MG PO TABS
1.0000 mg | ORAL_TABLET | Freq: Once | ORAL | Status: DC
  Filled 2012-12-07: qty 1

## 2012-12-07 MED ORDER — POTASSIUM CHLORIDE 10 MEQ/100ML IV SOLN
10.0000 meq | INTRAVENOUS | Status: AC
  Administered 2012-12-07 (×2): 10 meq via INTRAVENOUS
  Filled 2012-12-07 (×2): qty 100

## 2012-12-07 NOTE — Consult Note (Signed)
Referring Provider: No ref. provider found Primary Care Physician:  Default, Provider, MD Primary Gastroenterologist:  Corinda Gubler, ? Dr. Juanda Chance  Reason for Consultation:  PEG tube  HPI: Dawn Barrett is a 79 y.o. female with acute on chronic respiratory failure secondary to aspiration PNA.  She has failed two swallow evaluations and is still having aspiration on dysphagia 1 diet with nectar think liquids.  She is non-verbal for the most part.  Has atrial fibrillation and is on coumadin.  Had supra-therapeutic INR, but today it is 1.68.  Initially family was going to have only supportive care and no artificial means of feeding, but they have all discussed and changed their minds; they would like PEG placement.  The family knows Dr. Juanda Chance and called her personally.  She will resume patient's care tomorrow, 10/13.   Past Medical History  Diagnosis Date  . Stroke     a. L MCA CVA 2009.  Marland Kitchen Hypertension   . COPD (chronic obstructive pulmonary disease)   . Paroxysmal atrial fibrillation   . NSTEMI (non-ST elevated myocardial infarction)     a. 10/2012 - managed medically.  . Chronic combined systolic and diastolic CHF (congestive heart failure)   . Symptomatic bradycardia     a. s/p St. Jude pacemaker 2009.  Marland Kitchen PNA (pneumonia)     a. Per notes, h/o pulm injury r/t PNA.  . LBBB (left bundle branch block)   . HOCM (hypertrophic obstructive cardiomyopathy)     a. 2D Echo 10/2012: EF 40-45%, mild LVH, mild-mod TR, mitral SAM with mod MR, LVOT gradient not well assessed but at least 2.2m/s; findings c/w HOCM.  Marland Kitchen Mitral regurgitation     a. Mod MR by echo 10/2012.  . Tricuspid regurgitation     a. Mild-mod TR by echo 10/2012.    Past Surgical History  Procedure Laterality Date  . Pacemaker insertion  11/19/2007    St. Jude Zephyr 5020 pulse generator, serial [id number]  . Femur surgery Right 06/2011    Rod inserted after a fall.     Prior to Admission medications   Medication Sig Start Date  End Date Taking? Authorizing Provider  amiodarone (PACERONE) 200 MG tablet Take 2 tablets (400 mg total) by mouth 2 (two) times daily. 2 tabs 2 x day for 1 week, then 2 tabs daily. 11/21/12  Yes [name], PA-C  cholecalciferol (VITAMIN D) 1000 UNITS tablet Take 2,000 Units by mouth daily.   Yes Historical Provider, MD  Cyanocobalamin 1000 MCG/15ML LIQD Take 1,000 mcg by mouth daily.   Yes Historical Provider, MD  levalbuterol Pauline Aus) 0.63 MG/3ML nebulizer solution Take 3 mLs (0.63 mg total) by nebulization every 6 (six) hours as needed for wheezing. 11/21/12  Yes [name], PA-C  nitroGLYCERIN (NITROSTAT) 0.4 MG SL tablet Place 1 tablet (0.4 mg total) under the tongue every 5 (five) minutes x 3 doses as needed for chest pain. 11/21/12  Yes [name], PA-C  nystatin (MYCOSTATIN/NYSTOP) 100000 UNIT/GM POWD Apply 1 g topically daily as needed (to affected area).   Yes Historical Provider, MD  vitamin C (ASCORBIC ACID) 500 MG tablet Take 500 mg by mouth daily.   Yes Historical Provider, MD  warfarin (COUMADIN) 1 MG tablet Take 1 tablet (1 mg total) by mouth daily at 6 PM. 1 mg Mon-Tues-Wed-Fri-Sat; 0.5 mg Thursday and Sunday 11/21/12  Yes [name], PA-C    Current Facility-Administered Medications  Medication Dose Route Frequency Provider Last Rate Last Dose  .  acetaminophen (TYLENOL) tablet 650 mg  650 mg Oral Q6H PRN Cristal Ford, MD       Or  . acetaminophen (TYLENOL) suppository 650 mg  650 mg Rectal Q6H PRN Cristal Ford, MD      . amiodarone (PACERONE) tablet 100 mg  100 mg Oral Daily Calvert Cantor, MD   100 mg at 12/06/12 1107  . amoxicillin-clavulanate (AUGMENTIN) 500-125 MG per tablet 500 mg  1 tablet Oral BID Jeralyn Bennett, MD   500 mg at 12/06/12 2322  . antiseptic oral rinse (BIOTENE) solution 15 mL  15 mL Mouth Rinse BID Lonia Blood, MD   15 mL at 12/06/12 0903  . dextrose 5 % solution   Intravenous Continuous Calvert Cantor, MD 50 mL/hr at 12/07/12  0336    . enoxaparin (LOVENOX) injection 55 mg  1 mg/kg Subcutaneous Q12H Drake Leach Rumbarger, RPH   55 mg at 12/06/12 2156  . levalbuterol (XOPENEX) nebulizer solution 0.63 mg  0.63 mg Nebulization Q6H PRN Cristal Ford, MD   0.63 mg at 12/05/12 1918  . ondansetron (ZOFRAN) tablet 4 mg  4 mg Oral Q6H PRN Cristal Ford, MD       Or  . ondansetron (ZOFRAN) injection 4 mg  4 mg Intravenous Q6H PRN Cristal Ford, MD      . potassium chloride 10 mEq in 100 mL IVPB  10 mEq Intravenous Q1 Hr x 2 Jeralyn Bennett, MD      . tobramycin-dexamethasone [facility]) ophthalmic suspension 1 drop  1 drop Both Eyes Q6H Russella Dar, NP   1 drop at 12/07/12 [phone]  . warfarin (COUMADIN) tablet 1 mg  1 mg Oral ONCE-1800 Drake Leach Rumbarger, [facility]      . Warfarin - Pharmacist Dosing Inpatient   Does not apply q1800 Benny Lennert, [facility]        Allergies as of 12/03/2012 - Review Complete 12/03/2012  Allergen Reaction Noted  . Codeine Other (See Comments)   . Sulfonamide derivatives Other (See Comments)     Family History  Problem Relation Age of Onset  . Heart disease Mother     History   Social History  . Marital Status: Widowed    Spouse Name: N/A    Number of Children: N/A  . Years of Education: N/A   Occupational History  . Retired    Social History Main Topics  . Smoking status: Never Smoker   . Smokeless tobacco: Not on file  . Alcohol Use: No  . Drug Use: No  . Sexual Activity: Not on file   Other Topics Concern  . Not on file   Social History Narrative   Pt lives with daughter in [place] but has been in [place] recently.    Review of Systems: Ten point ROS is O/W negative except as mentioned in HPI.  Physical Exam: Vital signs in last 24 hours: Temp:  [97.8 F (36.6 C)-98.2 F (36.8 C)] 97.8 F (36.6 C) (10/12 0530) Pulse Rate:  [63-74] 74 (10/12 0530) Resp:  [16-18] 18 (10/12 0530) BP: (122-150)/(52-59) 122/52 mmHg (10/12 0530) SpO2:  [94 %-100 %] 94 %  (10/12 0530) Weight:  [125 lb 3.5 oz (56.8 kg)] 125 lb 3.5 oz (56.8 kg) (10/12 0530)   General:   Alert, frail and elderly female who is non-verbal, but cooperative in NAD Head:  Normocephalic and atraumatic. Eyes:  Sclera clear, no icterus.  Conjunctiva pink. Ears:  Normal auditory acuity. Mouth:  No  deformity or lesions.   Lungs:  Decreased breath sounds noted. Heart:  Regular rate and rhythm; 2/6 SEM present. Abdomen:  Soft, non-distended, non-tender, BS active, nonpalp mass or hsm.   Rectal:  Deferred  Msk:  Symmetrical without gross deformities. Pulses:  Normal pulses noted. Extremities:  +1 pitting edema noted. Neurologic:  Alert.  Non-verbal. Skin:  Intact without significant lesions or rashes.  Intake/Output from previous day: 10/11 0701 - 10/12 0700 In: 800 [I.V.:800] Out: -   Lab Results:  Recent Labs  12/05/12 0842 12/06/12 0700 12/07/12 0550  WBC 12.4* 8.8 6.6  HGB 10.4* 10.8* 9.6*  HCT 32.1* 34.6* 30.6*  PLT 140* 97* 94*   BMET  Recent Labs  12/05/12 1618 12/06/12 0700 12/07/12 0550  NA 152* 151* 148*  K 3.6 3.3* 3.0*  CL 114* 114* 110  CO2 29 30 32  GLUCOSE 113* 123* 118*  BUN 49* 39* 34*  CREATININE 0.95 0.79 0.81  CALCIUM 8.4 8.4 8.2*   LFT  Recent Labs  12/05/12 0500  PROT 7.4  ALBUMIN 2.0*  AST 61*  ALT 49*  ALKPHOS 452*  BILITOT 1.2  BILIDIR 0.5*  IBILI 0.7   PT/INR  Recent Labs  12/06/12 0700 12/07/12 0550  LABPROT 17.7* 19.3*  INR 1.50* 1.68*   IMPRESSION:  -Dysphagia:  Failed two swallow evaluations and having aspiration on dysphagia 1 diet with nectar thick liquids.  Family now wants PEG placement. -Acute on chronic respiratory failure secondary to aspiration PNA. -Atrial fibrillation on coumadin with supra-therapeutic INR initially.  PLAN: -Will put her NPO past midnight and let Dr. Juanda Chance reassess in AM before scheduling procedure.  Will check INR again in AM to be sure that it is <1.5.   ZEHR, JESSICA D.   12/07/2012, 10:36 AM  Pager number [phone]  Attending MD note:   I have reviewed the above note,and spoke to pt's daughter Dawn Barrett. We will reassess in am. Pt needs K+ supplement, Coumadin on hold.  [facility] Pager # [phone]

## 2012-12-07 NOTE — Progress Notes (Signed)
ANTICOAGULATION CONSULT NOTE - Follow Up  Pharmacy Consult for Warfarin + lovenox Indication: Atrial fibrillation  Allergies  Allergen Reactions  . Codeine Other (See Comments)    REACTION: unknown  . Sulfonamide Derivatives Other (See Comments)    REACTION:  unknown   Patient Measurements: Height: 5\' 5"  (165.1 cm) Weight: 125 lb 3.5 oz (56.8 kg) IBW/kg (Calculated) : 57  Vital Signs: Temp: 97.8 F (36.6 C) (10/12 0530) Temp src: Axillary (10/12 0530) BP: 122/52 mmHg (10/12 0530) Pulse Rate: 74 (10/12 0530)  Labs:  Recent Labs  12/05/12 0500 12/05/12 0842 12/05/12 1618 12/06/12 0700 12/07/12 0550  HGB  --  10.4*  --  10.8* 9.6*  HCT  --  32.1*  --  34.6* 30.6*  PLT  --  140*  --  97* 94*  LABPROT 21.9*  --   --  17.7* 19.3*  INR 1.98*  --   --  1.50* 1.68*  CREATININE  --  0.95 0.95 0.79  --     Estimated Creatinine Clearance: 40.2 ml/min (by C-G formula based on Cr of 0.79).  Assessment: 79 yo female admitted with AMS, likely sepsis on antibiotics.  Patient also noted on coumadin PTA and admit INR > 10 (s/p vitamin K 5mg  IV on 10/9) with todays INR= 1.68. Warfarin resumed 10/10 (Home coumadin dose: 1mg  [id number], 0.5mg  TSu)  Goal of Therapy:  INR 2-3 Monitor platelets by anticoagulation protocol: Yes   Plan:  1. Repeat Coumadin 1mg  PO x 1 tonight 2. F/u AM INR 3. Continue lovenox 55mg  SQ Q12H 4. CBC Q72H while on lovenox  Lysle Pearl, PharmD, BCPS Pager # [phone] 12/07/2012 7:59 AM

## 2012-12-07 NOTE — Progress Notes (Signed)
I have received a tel call from pt's daughter Kendal Hymen, who has a POA, requesting PEG placement. All family members ( son and another daughter ) agree with the placement. I will see pt tomorrow  to assess . Please hold Coumadin for now, last INR 1.6  Thank you Lina Sar MD, [facility]

## 2012-12-07 NOTE — Progress Notes (Signed)
[facility] PROGRESS NOTE  Dawn Barrett ZOX:[id number] DOB: 01/28/1919 DOA: 12/03/2012 PCP: Default, Provider, MD  Assessment/Plan: 1. Acute on chronic respiratory failure. Likely secondary to aspiration ammonia and probable superimposed chronic obstructive pulmonary disease exacerbation. Continue antibiotic therapy. She appears stabilized from a respiratory standpoint. 2. Acute encephalopathy. Patient having a steep functional decline, likely multifactorial with underlying infectious process, dehydration and hypernatremia contributing. Today she did say a few words to me, is awake alert.  3. Dehydration. Patient unable to take PO secondary to aspiration. GI consult for PEG tube placement. 4. Aspiration. Patient was evaluated by speech pathology, had signs of overt aspiration. Options were discussed with family members over telephone conversation this morning, as they have elected to pursue PEG tube placement. I explained the potential risks and benefits involved. Gastroenterology was consulted, I discussed case with Dr. Arlyce Dice. Family members wishing for Dr. Juanda Chance to be involved in patient's care. 5. Severe protein calorie malnutrition. Patient with history of CVA and chronic aspiration. Gastroenterology was consulted for PEG tube placement.  6. Hypokalemia. Patient potassium trending down to 3.0, will administer 20 mEq of IV potassium chloride 7. Paroxysmal atrial fibrillation. Patient on anticoagulation, pharmacy consulted for Coumadin dosing. She initially presented with a supratherapeutic INR, now down to 1.5. Follow pharmacist recommendations. 8. Goals of care. I had an extensive discussion yesterday regarding goals of care. These were readdressed today  noting patient's inability to take PO due to aspiration.  Family members requesting gastroenterology consult for PEG tube placement. I went over the potential burdens of PEG tube including wound infections, aspiration, obstruction of  the feeding tubes and pain.  I also explained that PEG tube placement could also provide a means for providing nutrition and administration medications. GI consulted.   Code Status: DO NOT RESUSCITATE Family Communication: Plan Will discuss with patient's daughter over telephone conversation Disposition Plan: GI consult for PEG tube placement   Consultants:  Cardiology  Pulmonary critical care  GI   Antibiotics:  Vancomycin, discontinue 12/06/2012  Cefepime, discontinue 12/06/2012  Augmentin, starting 12/06/2012  HPI/Subjective: Patient is awake and alert, and said a few words to me this morning. Unfortunately despite attempting to provide dysphasia 1 diet with nectar thick fluids, she has been unable to tolerate any by mouth due to choking and aspiration. I discussed this with family members over telephone conversation, who requested GI consultation for PEG tube placement.   Objective: Filed Vitals:   12/07/12 0530  BP: 122/52  Pulse: 74  Temp: 97.8 F (36.6 C)  Resp: 18    Intake/Output Summary (Last 24 hours) at 12/07/12 1027 Last data filed at 12/07/12 0600  Gross per 24 hour  Intake    800 ml  Output      0 ml  Net    800 ml   Filed Weights   12/04/12 0500 12/05/12 0500 12/07/12 0530  Weight: 55.7 kg (122 lb 12.7 oz) 57 kg (125 lb 10.6 oz) 56.8 kg (125 lb 3.5 oz)    Exam:   General:  Thin-appearing, chronically ill, however awake. Nonverbal.   Cardiovascular: 2/6 systolic ejection murmur, regular rate and rhythm normal S1-S2  Respiratory: By basilar crackles noted, scattered expiratory wheezes, normal respiratory effort on supplemental oxygen  Abdomen: Soft nontender nondistended positive bowel sounds  Extremities: Patient having 1+ bilateral extremity pitting edema  Data Reviewed: Basic Metabolic Panel:  Recent Labs Lab 12/04/12 0925 12/05/12 0842 12/05/12 1618 12/06/12 0700 12/07/12 0550  NA 151* 154* 152* 151* 148*  K 4.2 4.3 3.6 3.3*  3.0*  CL 112 115* 114* 114* 110  CO2 32 30 29 30  32  GLUCOSE 125* 84 113* 123* 118*  BUN 49* 51* 49* 39* 34*  CREATININE 1.15* 0.95 0.95 0.79 0.81  CALCIUM 8.8 8.4 8.4 8.4 8.2*   Liver Function Tests:  Recent Labs Lab 12/04/12 0009 12/05/12 0500  AST 114* 61*  ALT 68* 49*  ALKPHOS 586* 452*  BILITOT 1.1 1.2  PROT 8.2 7.4  ALBUMIN 2.2* 2.0*   No results found for this basename: LIPASE, AMYLASE,  in the last 168 hours No results found for this basename: AMMONIA,  in the last 168 hours CBC:  Recent Labs Lab 12/04/12 0009 12/04/12 0925 12/05/12 0842 12/06/12 0700 12/07/12 0550  WBC 14.9* 14.9* 12.4* 8.8 6.6  NEUTROABS 11.5*  --   --   --   --   HGB 12.7 10.3* 10.4* 10.8* 9.6*  HCT 40.4 32.0* 32.1* 34.6* 30.6*  MCV 99.5 98.5 98.5 98.9 98.7  PLT 133* 124* 140* 97* 94*   Cardiac Enzymes:  Recent Labs Lab 12/04/12 0115 12/04/12 0345  CKTOTAL 39  --   CKMB 4.6*  --   TROPONINI  --  <0.30   BNP (last 3 results)  Recent Labs  11/14/12 0928 11/26/12 1647 12/04/12 0009  PROBNP 3007.0* 254.0* 5031.0*   CBG:  Recent Labs Lab 12/03/12 2335  GLUCAP 129*    Recent Results (from the past 240 hour(s))  MRSA PCR SCREENING     Status: None   Collection Time    12/04/12  5:17 AM      Result Value Range Status   MRSA by PCR NEGATIVE  NEGATIVE Final   Comment:            The GeneXpert MRSA Assay (FDA     approved for NASAL specimens     only), is one component of a     comprehensive MRSA colonization     surveillance program. It is not     intended to diagnose MRSA     infection nor to guide or     monitor treatment for     MRSA infections.  CULTURE, BLOOD (ROUTINE X 2)     Status: None   Collection Time    12/04/12  9:00 AM      Result Value Range Status   Specimen Description BLOOD LEFT ANTECUBITAL   Final   Special Requests BOTTLES DRAWN AEROBIC AND ANAEROBIC 10CC   Final   Culture  Setup Time     Final   Value: 12/04/2012 16:53     Performed at  [facility]   Culture     Final   Value:        BLOOD CULTURE RECEIVED NO GROWTH TO DATE CULTURE WILL BE HELD FOR 5 DAYS BEFORE ISSUING A FINAL NEGATIVE REPORT     Performed at [facility]   Report Status PENDING   Incomplete  CULTURE, BLOOD (ROUTINE X 2)     Status: None   Collection Time    12/04/12  9:25 AM      Result Value Range Status   Specimen Description BLOOD HAND RIGHT   Final   Special Requests BOTTLES DRAWN AEROBIC ONLY 5CC   Final   Culture  Setup Time     Final   Value: 12/04/2012 16:53     Performed at [facility]   Culture     Final  Value:        BLOOD CULTURE RECEIVED NO GROWTH TO DATE CULTURE WILL BE HELD FOR 5 DAYS BEFORE ISSUING A FINAL NEGATIVE REPORT     Performed at [facility]   Report Status PENDING   Incomplete  URINE CULTURE     Status: None   Collection Time    12/04/12 10:45 PM      Result Value Range Status   Specimen Description URINE, CATHETERIZED   Final   Special Requests NONE   Final   Culture  Setup Time     Final   Value: 12/05/2012 05:05     Performed at [facility] Count     Final   Value: NO GROWTH     Performed at [facility]   Culture     Final   Value: NO GROWTH     Performed at [facility]   Report Status 12/06/2012 FINAL   Final     Studies: No results found.  Scheduled Meds: . amiodarone  100 mg Oral Daily  . amoxicillin-clavulanate  1 tablet Oral BID  . antiseptic oral rinse  15 mL Mouth Rinse BID  . enoxaparin (LOVENOX) injection  1 mg/kg Subcutaneous Q12H  . potassium chloride  10 mEq Intravenous Q1 Hr x 2  . tobramycin-dexamethasone  1 drop Both Eyes Q6H  . warfarin  1 mg Oral ONCE-1800  . Warfarin - Pharmacist Dosing Inpatient   Does not apply q1800   Continuous Infusions: . dextrose 50 mL/hr at 12/07/12 1610    Principal Problem:   Sepsis Active Problems:   Long term (current) use of anticoagulants   Paroxysmal atrial fibrillation    Aspiration pneumonia   Acute encephalopathy   Dehydration   Elevated LFTs   Hypertrophic obstructive cardiomyopathy(425.11)   Mitral regurgitation   Protein-calorie malnutrition, severe   H/O cardiac pacemaker   Elevated troponin   Acute respiratory failure with hypoxia   Supratherapeutic INR    Time spent: 35 minutes    Jeralyn Bennett  [facility] Pager [phone] 7PM-7AM, please contact night-coverage at [email or web address], password [email or web address] 12/07/2012, 10:27 AM  LOS: 4 days

## 2012-12-08 ENCOUNTER — Encounter (HOSPITAL_COMMUNITY): Payer: Self-pay | Admitting: Physician Assistant

## 2012-12-08 ENCOUNTER — Other Ambulatory Visit: Payer: Self-pay | Admitting: *Deleted

## 2012-12-08 DIAGNOSIS — E43 Unspecified severe protein-calorie malnutrition: Secondary | ICD-10-CM

## 2012-12-08 LAB — BASIC METABOLIC PANEL
BUN: 34 mg/dL — ABNORMAL HIGH (ref 6–23)
CO2: 31 mEq/L (ref 19–32)
Chloride: 107 mEq/L (ref 96–112)
GFR calc Af Amer: 66 mL/min — ABNORMAL LOW (ref >90)
Potassium: 4 mEq/L (ref 3.5–5.1)
Sodium: 142 mEq/L (ref 135–145)

## 2012-12-08 LAB — PROTIME-INR: Prothrombin Time: 19.3 seconds — ABNORMAL HIGH (ref 11.6–15.2)

## 2012-12-08 MED ORDER — ENOXAPARIN SODIUM 60 MG/0.6ML ~~LOC~~ SOLN: 1.0000 mg/kg | Freq: Two times a day (BID) | SUBCUTANEOUS | Status: DC

## 2012-12-08 MED ORDER — ENOXAPARIN SODIUM 60 MG/0.6ML ~~LOC~~ SOLN
1.0000 mg/kg | Freq: Two times a day (BID) | SUBCUTANEOUS | Status: DC
  Administered 2012-12-08 – 2012-12-10 (×4): 55 mg via SUBCUTANEOUS
  Filled 2012-12-08 (×6): qty 0.6

## 2012-12-08 NOTE — Progress Notes (Addendum)
Upon assessment of patients' IV arm was edematous, ecchymotic, and mild warmth. IV d/c'd d/t possible infiltration.

## 2012-12-08 NOTE — Progress Notes (Signed)
Spoke with patient's daughter Nicosha Struve regarding PICC line placement at bedside.  After discussing with her that this would be done at bedside by specially trained PICC RNs with ultrasound guidance and ECG tip confirmation, daughter requested that interventional radiology perform procedure.  Informed daughter that this would not be able to be done tonight and would have to be scheduled with radiology in the morning.  Bedside nurse Herbert Seta, RN updated on phone conversation.  Kaelon Weekes, Lajean Manes, RN IV team

## 2012-12-08 NOTE — Progress Notes (Signed)
[facility] Daily Rounding Note 12/08/2012, 11:12 AM  SUBJECTIVE:       No family at bedside, private duty nursing assistant is present.  Pt is NPO.  5 episodes of urination recorded yesterday.  The amount of urine output is not being measured. She is incontinent of urine and stool per charting.  1 stool recorded yesterday.   OBJECTIVE:         Vital signs in last 24 hours:    Temp:  [97.2 F (36.2 C)-98.9 F (37.2 C)] 97.8 F (36.6 C) (10/13 0538) Pulse Rate:  [65-70] 65 (10/13 0538) Resp:  [16-20] 16 (10/13 0538) BP: (120-133)/(47-60) 125/60 mmHg (10/13 0538) SpO2:  [98 %-100 %] 100 % (10/13 0538) Weight:  [55.9 kg (123 lb 3.8 oz)] 55.9 kg (123 lb 3.8 oz) (10/13 0538)   General: exceptionally frail, kyphotic aged WF.   ENT: oro pharynx with limited visualization had some white spots on tongue, not obviously candida though.  MM moist.   Can hear mucous rattling in back of throat as she breathes.    Heart: RRR Chest: clear in front.  Abdomen: soft, NT, ND.  BS active.   Extremities: + pedal/LE edema.  + purpura on all limbs Neuro/Psych:  Not verbal but follows commands and nods appropriately to questions.   Intake/Output from previous day: 10/12 0701 - 10/13 0700 In: 1690 [P.O.:240; I.V.:1250; IV Piggyback:200] Out: -   Intake/Output this shift:    Lab Results:  Recent Labs  12/06/12 0700 12/07/12 0550  WBC 8.8 6.6  HGB 10.8* 9.6*  HCT 34.6* 30.6*  PLT 97* 94*   BMET  Recent Labs  12/06/12 0700 12/07/12 0550 12/08/12 0655  NA 151* 148* 142  K 3.3* 3.0* 4.0  CL 114* 110 107  CO2 30 32 31  GLUCOSE 123* 118* 112*  BUN 39* 34* 34*  CREATININE 0.79 0.81 0.86  CALCIUM 8.4 8.2* 8.1*   LFT No results found for this basename: PROT, ALBUMIN, AST, ALT, ALKPHOS, BILITOT, BILIDIR, IBILI,  in the last 72 hours PT/INR  Recent Labs  12/07/12 0550 12/08/12 0655  LABPROT 19.3* 19.3*  INR 1.68* 1.68*   Hepatitis Panel No results found for this basename:  HEPBSAG, HCVAB, HEPAIGM, HEPBIGM,  in the last 72 hours  Studies/Results: No results found.  ASSESMENT: *  Aspiration PNA.  On po Augmentin.  Previously Rxd with Vanc, Maxipime, Unasyn.   *  Chronic dysphagia.  Apparently failed 2 prior swallow evals at outside hospital.  Was on D1/nectar diet and still actively aspirating. SLP bedside swallow eval of 10/11 with overt aspiration of pudding/puree/honeythicks.   Family had been taking Palliative approach with plans for terminal care at home (see [facility] hospital  discharge note of 09/19/12) until very recently. Did not want hospice involved (pt called hospice the "death squad") *  Chronic Coumadin for A fib/Hx CVA2009, INR 1.6, down from high of > 10 on 10/9.  Got IV Vit K on 10/9.  No FFP given to date.  Coumadin on hold, last dose was 12/06/12 Lovenox in place.  *  Cardiac pacemaker 2009 for bradycardia.  *  Pulmonary fibrosis. , chronic home oxygen.  *  Elevation of Transaminases to~ 2 to 4 x normal: improving.  Agree this is likely due to hypoperfusion.  *  Hx SIADH *  FTT, bed bound at baseline.    PLAN: *  Dr Juanda Chance to discuss PEG with family and pt today.   The  PEG is unlikely to prevent or delay inevitable decline.  *  Was made NPO for possible PEG today.  PEG, if pursued, will be in OR.  I arranged 3:30 PM slot for tomorrow.  *  Stop Lovenox after tonights 2200 dose.   *  Could resume the pure/nectar diet for today but as the SLP recs say: only if alert enough to swallow.  *  Added IVF of 1/2 NS at 50 cc per hour, given her limited po intake    LOS: 5 days   Jennye Moccasin  12/08/2012, 11:12 AM Pager: [phone] Attending MD note:   I have reviewed the above note, examined the patient . I had a conference with  Both daughters concerning need for nutritional support and hydration without an invasive procedure. It seems that pt is mostly in need of hydration  Which could be delivered via PICC line at home ( she has an Charity fundraiser and a  CNA at home). Patient and family desires to continue oral intake even in presence of a PEG. The advantages of a  PEG in this situation are small compared with the risk of the placing of the PEG ( would need Propofol anesthesia).For now the family wishes the following:   No  PEG  Placements of a PICC line for home IV hydration Continue Dysphagia 1 diet.  Try to assess her po intake of liquids and calories for next 24-48 hours  To help with the home plan. Discharge home  soon, may use IV antibiotics via PICC line.      Willa [facility] Pager # [phone]

## 2012-12-08 NOTE — Progress Notes (Signed)
[facility] PROGRESS NOTE  Dawn Barrett ZOX:[id number] DOB: [birth year] DOA: 12/03/2012 PCP: Default, Provider, MD  Assessment/Plan: 1. Acute on chronic respiratory failure. Likely secondary to aspiration ammonia and probable superimposed chronic obstructive pulmonary disease exacerbation. Continue antibiotic therapy. She appears stabilized from a respiratory standpoint. 2. Acute encephalopathy. Patient having a steep functional decline, likely multifactorial with underlying infectious process, dehydration and hypernatremia contributing. Today she did say a few words to me, is awake alert.  3. Dehydration. Patient unable to take PO secondary to aspiration. GI consult for PEG tube placement. 4. Aspiration. Family discussion held today with Dr. Juanda Chance of gastroenterology. If family wishes to proceed with PEG tube placement will likely be done tomorrow. Followup on INR. 5. Severe protein calorie malnutrition. Patient with history of CVA and chronic aspiration. Gastroenterology was consulted for PEG tube placement.  6. Hypokalemia. Resolved, patient receiving potassium supplementation he is  7. Paroxysmal atrial fibrillation. Patient on anticoagulation, pharmacy consulted for Coumadin dosing. She initially presented with a supratherapeutic INR, now down to 1.5. Follow pharmacist recommendations. 8. Goals of care. Patient's family members expressed a desire for PEG tube placement. I discussed this with Dr. Juanda Chance of gastroenterology.  Code Status: DO NOT RESUSCITATE Family Communication: Plan Will discuss with patient's daughter over telephone conversation Disposition Plan: GI consult for PEG tube placement   Consultants:  Cardiology  Pulmonary critical care  GI   Antibiotics:  Vancomycin, discontinue 12/06/2012  Cefepime, discontinue 12/06/2012  Augmentin, starting 12/06/2012  HPI/Subjective: Patient has not changed clinically since yesterday. She is awake, and did say a  few words to me today. She also noted a properly to several my questions.  Objective: Filed Vitals:   12/08/12 0538  BP: 125/60  Pulse: 65  Temp: 97.8 F (36.6 C)  Resp: 16    Intake/Output Summary (Last 24 hours) at 12/08/12 1236 Last data filed at 12/08/12 0600  Gross per 24 hour  Intake   1690 ml  Output      0 ml  Net   1690 ml   Filed Weights   12/05/12 0500 12/07/12 0530 12/08/12 0538  Weight: 57 kg (125 lb 10.6 oz) 56.8 kg (125 lb 3.5 oz) 55.9 kg (123 lb 3.8 oz)    Exam:   General:  Thin-appearing, chronically ill, however awake. Nonverbal.   Cardiovascular: 2/6 systolic ejection murmur, regular rate and rhythm normal S1-S2  Respiratory: By basilar crackles noted, scattered expiratory wheezes, normal respiratory effort on supplemental oxygen  Abdomen: Soft nontender nondistended positive bowel sounds  Extremities: Patient having 1+ bilateral extremity pitting edema  Data Reviewed: Basic Metabolic Panel:  Recent Labs Lab 12/05/12 0842 12/05/12 1618 12/06/12 0700 12/07/12 0550 12/08/12 0655  NA 154* 152* 151* 148* 142  K 4.3 3.6 3.3* 3.0* 4.0  CL 115* 114* 114* 110 107  CO2 30 29 30  32 31  GLUCOSE 84 113* 123* 118* 112*  BUN 51* 49* 39* 34* 34*  CREATININE 0.95 0.95 0.79 0.81 0.86  CALCIUM 8.4 8.4 8.4 8.2* 8.1*   Liver Function Tests:  Recent Labs Lab 12/04/12 0009 12/05/12 0500  AST 114* 61*  ALT 68* 49*  ALKPHOS 586* 452*  BILITOT 1.1 1.2  PROT 8.2 7.4  ALBUMIN 2.2* 2.0*   No results found for this basename: LIPASE, AMYLASE,  in the last 168 hours No results found for this basename: AMMONIA,  in the last 168 hours CBC:  Recent Labs Lab 12/04/12 0009 12/04/12 0925 12/05/12 0842 12/06/12 0700 12/07/12 0550  WBC 14.9* 14.9* 12.4* 8.8 6.6  NEUTROABS 11.5*  --   --   --   --   HGB 12.7 10.3* 10.4* 10.8* 9.6*  HCT 40.4 32.0* 32.1* 34.6* 30.6*  MCV 99.5 98.5 98.5 98.9 98.7  PLT 133* 124* 140* 97* 94*   Cardiac Enzymes:  Recent  Labs Lab 12/04/12 0115 12/04/12 0345  CKTOTAL 39  --   CKMB 4.6*  --   TROPONINI  --  <0.30   BNP (last 3 results)  Recent Labs  11/14/12 0928 11/26/12 1647 12/04/12 0009  PROBNP 3007.0* 254.0* 5031.0*   CBG:  Recent Labs Lab 12/03/12 2335  GLUCAP 129*    Recent Results (from the past 240 hour(s))  MRSA PCR SCREENING     Status: None   Collection Time    12/04/12  5:17 AM      Result Value Range Status   MRSA by PCR NEGATIVE  NEGATIVE Final   Comment:            The GeneXpert MRSA Assay (FDA     approved for NASAL specimens     only), is one component of a     comprehensive MRSA colonization     surveillance program. It is not     intended to diagnose MRSA     infection nor to guide or     monitor treatment for     MRSA infections.  CULTURE, BLOOD (ROUTINE X 2)     Status: None   Collection Time    12/04/12  9:00 AM      Result Value Range Status   Specimen Description BLOOD LEFT ANTECUBITAL   Final   Special Requests BOTTLES DRAWN AEROBIC AND ANAEROBIC 10CC   Final   Culture  Setup Time     Final   Value: 12/04/2012 16:53     Performed at [facility]   Culture     Final   Value:        BLOOD CULTURE RECEIVED NO GROWTH TO DATE CULTURE WILL BE HELD FOR 5 DAYS BEFORE ISSUING A FINAL NEGATIVE REPORT     Performed at [facility]   Report Status PENDING   Incomplete  CULTURE, BLOOD (ROUTINE X 2)     Status: None   Collection Time    12/04/12  9:25 AM      Result Value Range Status   Specimen Description BLOOD HAND RIGHT   Final   Special Requests BOTTLES DRAWN AEROBIC ONLY 5CC   Final   Culture  Setup Time     Final   Value: 12/04/2012 16:53     Performed at [facility]   Culture     Final   Value:        BLOOD CULTURE RECEIVED NO GROWTH TO DATE CULTURE WILL BE HELD FOR 5 DAYS BEFORE ISSUING A FINAL NEGATIVE REPORT     Performed at [facility]   Report Status PENDING   Incomplete  URINE CULTURE     Status: None    Collection Time    12/04/12 10:45 PM      Result Value Range Status   Specimen Description URINE, CATHETERIZED   Final   Special Requests NONE   Final   Culture  Setup Time     Final   Value: 12/05/2012 05:05     Performed at [facility] Count     Final   Value: NO GROWTH  Performed at [facility]     Final   Value: NO GROWTH     Performed at [facility]   Report Status 12/06/2012 FINAL   Final     Studies: No results found.  Scheduled Meds: . amiodarone  100 mg Oral Daily  . amoxicillin-clavulanate  1 tablet Oral BID  . antiseptic oral rinse  15 mL Mouth Rinse BID  . enoxaparin (LOVENOX) injection  1 mg/kg Subcutaneous Q12H  . tobramycin-dexamethasone  1 drop Both Eyes Q6H   Continuous Infusions: . dextrose 50 mL/hr at 12/07/12 2252    Principal Problem:   Sepsis Active Problems:   Long term (current) use of anticoagulants   Paroxysmal atrial fibrillation   Aspiration pneumonia   Acute encephalopathy   Dehydration   Elevated LFTs   Hypertrophic obstructive cardiomyopathy(425.11)   Mitral regurgitation   Protein-calorie malnutrition, severe   H/O cardiac pacemaker   Elevated troponin   Acute respiratory failure with hypoxia   Supratherapeutic INR   Feeding difficulties and mismanagement    Time spent: 35 minutes    Jeralyn Bennett  [facility] Pager [phone] 7PM-7AM, please contact night-coverage at [email or web address], password [facility] 12/08/2012, 12:36 PM  LOS: 5 days

## 2012-12-08 NOTE — Progress Notes (Signed)
IV team notified of order for picc line.  When IV nurse attempted to receive consent for procedure from daughter Rod Holler she was told family does not want IV team to place picc line but only wants radiology to place the line.  MD notified of families desire for picc line to be placed in interventional radiology.

## 2012-12-08 NOTE — Progress Notes (Signed)
SLP Cancellation Note  Patient Details Name: Dawn Barrett MRN: [id number] DOB: 1918-10-11   Cancelled treatment:       Reason Eval/Treat Not Completed: Medical issues which prohibited therapy. Pt to have PEG placement. If family has elected PEG d/t high risk of aspiration, would not recommend resuming POs for pt comfort unless there is an obvious improvement in pt strength/function. She was observed to have overt aspiration of purees and pudding thick liquids on Saturday. May need to be reevaluated by Home Health SLP after d/c for readiness to resume POs if that is part of pt/family's wishes. Will call pts daughter with this recommendation.   Harlon Ditty, MA CCC-SLP [phone]  Claudine Mouton 12/08/2012, 9:27 AM

## 2012-12-09 ENCOUNTER — Encounter (HOSPITAL_COMMUNITY)
Admission: EM | Disposition: A | Discharge status: Home Health | Payer: Self-pay | Source: Home / Self Care | Attending: Internal Medicine

## 2012-12-09 ENCOUNTER — Inpatient Hospital Stay (HOSPITAL_COMMUNITY): Payer: Medicare Other

## 2012-12-09 ENCOUNTER — Telehealth: Payer: Self-pay | Admitting: Internal Medicine

## 2012-12-09 DIAGNOSIS — R791 Abnormal coagulation profile: Secondary | ICD-10-CM

## 2012-12-09 LAB — PROTIME-INR
INR: 1.86 — ABNORMAL HIGH (ref 0.00–1.49)
Prothrombin Time: 20.9 seconds — ABNORMAL HIGH (ref 11.6–15.2)

## 2012-12-09 SURGERY — INSERTION, PEG TUBE
Anesthesia: Monitor Anesthesia Care

## 2012-12-09 NOTE — Progress Notes (Signed)
[facility] Rounding Note 12/09/2012, 10:38 AM  SUBJECTIVE:       No complaints.  Bedside attendant says less coughing.     OBJECTIVE:         Vital signs in last 24 hours:    Temp:  [97.1 F (36.2 C)-97.8 F (36.6 C)] 97.2 F (36.2 C) (10/14 0615) Pulse Rate:  [65-72] 72 (10/14 0615) Resp:  [16-18] 16 (10/14 0615) BP: (97-125)/(48-63) 97/48 mmHg (10/14 0615) SpO2:  [98 %-100 %] 100 % (10/14 0615) Weight:  [65 kg (143 lb 4.8 oz)] 65 kg (143 lb 4.8 oz) (10/14 0615) Last BM Date: 12/07/12 General: frail, alert, comfortable .  Did not perform manual exam Chest: No cough, no labored breathing.  Milky liquid material in suction canister, looks like vanilla ensure Neuro/Psych:  Follows commands, alert, relaxed.   Intake/Output from previous day: 10/13 0701 - 10/14 0700 In: 750 [I.V.:750] Out: -   Intake/Output this shift: Total I/O In: 200 [P.O.:200] Out: -   Lab Results:  Recent Labs  12/07/12 0550  WBC 6.6  HGB 9.6*  HCT 30.6*  PLT 94*   BMET  Recent Labs  12/07/12 0550 12/08/12 0655  NA 148* 142  K 3.0* 4.0  CL 110 107  CO2 32 31  GLUCOSE 118* 112*  BUN 34* 34*  CREATININE 0.81 0.86  CALCIUM 8.2* 8.1*   LFT No results found for this basename: PROT, ALBUMIN, AST, ALT, ALKPHOS, BILITOT, BILIDIR, IBILI,  in the last 72 hours PT/INR  Recent Labs  12/08/12 0655 12/09/12 0805  LABPROT 19.3* 20.9*  INR 1.68* 1.86*    ASSESMENT: *  Dysphagia/FTT in aged WF * Aspiration PNA. On po Augmentin. Previously Rxd with Vanc, Maxipime, Unasyn.  * Chronic dysphagia. Apparently failed 2 prior swallow evals at outside hospital. Was on D1/nectar diet and still actively aspirating.  SLP bedside swallow eval of 10/11 with overt aspiration of pudding/puree/honeythicks.  Family had been taking Palliative approach with plans for terminal care at home (see [facility] hospital discharge note of 09/19/12) until very recently. Did not want hospice involved (pt  called hospice the "death squad")  * Chronic Coumadin for A fib/Hx CVA2009, INR 1.6, down from high of > 10 on 10/9. Got IV Vit K on 10/9. No FFP given to date.  Coumadin on hold, last dose was 12/06/12  Lovenox in place.  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  * Pulmonary fibrosis.   * Elevation of Transaminases to~ 2 to 4 x normal: improving. Agree this is likely due to hypoperfusion.  * Hx SIADH  * FTT, bed bound at baseline.    PLAN: *  PICC line per IR (family insists on IR doing this, refused to have IV team place PICC) *  Who is the pt's PMD who can place orders for IVF and maintenance of PICC line?  The private duty medical assistant is not sure who this is. This individual needs to be determined to assure continuity of care.  Dr Juanda Chance is not planning any follow up with GI.   *  Resume coumadin when safe to do so.  *  GI will sign off   LOS: 6 days   Jennye Moccasin  12/09/2012, 10:38 AM Pager: [phone]. Attending MD note:   I have reviewed the above note, examined the patient, I have updated  daughter Kendal Hymen of pt's status. PICC line has been placed. Pt has a PCP from [facility] who will order  IV fluids.Family expects pt to be discharged tomorrow.  Willa [facility] Pager # [phone]

## 2012-12-09 NOTE — Procedures (Signed)
Interventional Radiology Procedure Note  Procedure: Placement of a  Left brachial vein single lumen PowerPICC.  Catheter tip at cavoatrial jxn and ready for use Complications:  none Recommendations: - Routine line care  Signed,  Sterling Big, MD [facility]

## 2012-12-09 NOTE — Progress Notes (Signed)
NUTRITION FOLLOW UP  Intervention:   1.  Supplements; continue Glucerna Shake po TID, each supplement provides 220 kcal and 10 grams of protein thickened to appropriate consistency.  Also continue Ensure Pudding po TID, each supplement provides 170 kcal and 4 grams of protein.   Nutrition Dx:   Inadequate oral intake, ongoing   Monitor:  1. Food/Beverage; diet advancement if appropriate with pt meeting >/=90% estimated needs with tolerance.  2. Wt/wt change; monitor trends  Assessment:   Pt admitted with AMS. Pt with h/o dysphagia from CVA 5 years ago.   Pt aspirating on all consistencies. Family is accepting risk of aspiration.  Consideration of G-tube noted, however pt at risk for placement, and was to continue comfort feeds PO regardless of tube placement.   Pt now to receive PICC line in IR for fluids at home, and is moving toward a Palliative approach for her care.   RD notes MD mention of possible calorie count.  Discussed intake with aide at bedside.  Intake this morning was 75% eggs, 30% pudding, and 10% of a Glucerna shake (estimated 140 kcal, 10g protein total intake at breakfast).  Pt not likely to meet needs, however with continual offering of food and supplements she is eating foods as desired and receiving calorie and protein dense foods.  Calorie count not started by this RD as pt's intake overall is suboptimal. Aide reports coughing with meals "but not as much as before."  RD to follow.  Height: Ht Readings from Last 1 Encounters:  12/04/12 5\' 5"  (1.651 m)    Weight Status:   Wt Readings from Last 1 Encounters:  12/09/12 143 lb 4.8 oz (65 kg)    Re-estimated needs:  Kcal: 1350-1560 Protein: 55-66g Fluid: ~1.5 L/day  Skin: non-pitting edema, Stage 1 pressure ulcer  Diet Order: Dysphagia 1, pudding thick   Intake/Output Summary (Last 24 hours) at 12/09/12 1036 Last data filed at 12/09/12 0900  Gross per 24 hour  Intake    950 ml  Output      0 ml  Net     950 ml    Last BM: 10/12   Labs:   Recent Labs Lab 12/06/12 0700 12/07/12 0550 12/08/12 0655  NA 151* 148* 142  K 3.3* 3.0* 4.0  CL 114* 110 107  CO2 30 32 31  BUN 39* 34* 34*  CREATININE 0.79 0.81 0.86  CALCIUM 8.4 8.2* 8.1*  GLUCOSE 123* 118* 112*    CBG (last 3)  No results found for this basename: GLUCAP,  in the last 72 hours  Scheduled Meds: . amiodarone  100 mg Oral Daily  . amoxicillin-clavulanate  1 tablet Oral BID  . antiseptic oral rinse  15 mL Mouth Rinse BID  . enoxaparin (LOVENOX) injection  1 mg/kg Subcutaneous Q12H  . tobramycin-dexamethasone  1 drop Both Eyes Q6H    Continuous Infusions: . dextrose 50 mL/hr at 12/07/12 2252    Loyce Dys, MS RD LDN Clinical Inpatient Dietitian Pager: [phone] Weekend/After hours pager: [phone]

## 2012-12-09 NOTE — Care Management Note (Signed)
  Page 2 of 2   12/11/2012     11:26:32 AM   CARE MANAGEMENT NOTE 12/11/2012  Patient:  Dawn Barrett, Dawn Barrett   Account Number:  [id number]  Date Initiated:  12/08/2012  Documentation initiated by:  Ronny Flurry  Subjective/Objective Assessment:     Action/Plan:   Anticipated DC Date:  12/10/2012   Anticipated DC Plan:  HOME W HOME HEALTH SERVICES         Choice offered to / List presented to:          [facility] arranged  HH-1 RN  HH-2 PT  HH-3 OT  HH-5 SPEECH THERAPY      HH agency  [facility]   Status of service:   Medicare Important Message given?   (If response is "NO", the following Medicare IM given date fields will be blank) Date Medicare IM given:   Date Additional Medicare IM given:    Discharge Disposition:    Per UR Regulation:    If discussed at Long Length of Stay Meetings, dates discussed:    Comments:    12-11-12  Order :  D5 at 40 cc/ hr via PICC line continuous for 3 days  , then DR Wyvonnia Lora [phone] ) will reassess. IVF to be managed by DR Irena Cords [phone].   PICC line care per protocol    Patient is discharging today .  Faxed order to  Asencion Partridge at [place] ( [employer] [facility] ) , [place] will have IVF delivered to patient's home today .  Mary with [facility] aware of order and will have a HHRN at patient's home this afternoon to set up IVF infusion and [facility] will draw labs and call to Dr Wyvonnia Lora .  Faxed order and H and P to Zella Ball at [facility] [phone]  ( Dr Wyvonnia Lora office ) , Zella Ball reported Dr Wyvonnia Lora agrees with orders . Will fax discharge summary to [facility] [phone]  when same is completed .  All parties aware patient is discharging today .  Ronny Flurry RN BSN [phone]      12-10-12 Confirmed with Rica Koyanagi at Dr Graciela Husbands office , Dr Graciela Husbands is signing off therefore will not be signing home health orders . Called patient's daughter Rod Holler [phone] to discuss need of PCP . T[place] stated  that DR Wyvonnia Lora with [facility] [phone]  will sign orders . Called DR Wyvonnia Lora to confirm awaiting call back.  Ronny Flurry RN BSN [phone]    12-09-12 Plan for patient to have PICC line placed today . Patient will be discharging to home on IVF . Discussed in progression this am with Attending MD Forde Radon will need to know ASAP IVF and rate.  Kristen Hollowell [phone] with [facility] aware of plan.  Ronny Flurry RN BSN [phone]   12-08-12 Patient active with [facility] RN/PT/OT prior to admission . Ronny Flurry RN BSN [phone]

## 2012-12-09 NOTE — Telephone Encounter (Signed)
New problem   Daughter at [employer] now.    Patient having a PICC line placement today . What type of IV patient need to be sent home. The family is still dealing with dehydration.  Patient in the [place] tower.

## 2012-12-09 NOTE — Telephone Encounter (Signed)
Called Trish at the hospital and she advised we are no longer rounding on this patient. Dr.Klein has signed off on her. Called her nurse Herbert Seta at [facility] and she will follow up with Kendal Hymen.

## 2012-12-09 NOTE — Progress Notes (Signed)
[facility] PROGRESS NOTE  NIVA MURREN ZOX:[id number] DOB: 1918-10-14 DOA: 12/03/2012 PCP: Default, Provider, MD  In brief patient is a 79 year old female with a past medical history of CVA 5 years ago resulting in dysphasia, chronically aspirating, poor functional status chronic obstructive pulmonary disease, paroxysmal age of fibrillation on chronic anticoagulation who was admitted to the medicine service on 12/04/2012. At that time she presented with mental status changes, significant functional decline, excessive daytime sleepiness and not interacting with caregivers. She was found to be hypothermic, hypotensive, with acute renal failure, hypernatremia and a supratherapeutic INR greater than 10. There was concerns for sepsis as she was initially admitted to the step down unit.  With her history of CHF and elevated troponin of 0.18 cardiology was consulted during this hospitalization. Cardiology felt that point of care troponin could reflect demand ischemia rather than acute coronary syndrome. They recommended continuing medical management. There was also concern for aspiration pneumonia given her history for which she was started on broad-spectrum empiric antibiotic therapy with cefepime and vancomycin. Acute encephalopathy likely multifactorial with underlying sepsis, aspiration pneumonia, profound dehydration, hypernatremia, and acute renal failure all contributors. She was transferred out of the step down unit on 12/05/2012. I had an extensive discussion with family members regarding goals of care. Family wishing for PEG tube to be placed and requested consultation from Dr. Juanda Chance of gastroenterology. I went over the potential burdens of PEG tube including wound infections, aspiration, obstruction of feeding tubes, and pain. Dr. Juanda Chance had a conference with daughters regarding nutritional support, as it was decided to pursue PICC line placement rather than placing PEG tube. Patient to undergo  PICC line placement by interventional radiology later today. To this point it is unclear who will manage IV fluids as an outpatient.Patient does not have a PCP in the community as she sees Pulmonary and Cardiology as an outpatient.   Assessment/Plan: 1. Acute on chronic respiratory failure. Likely secondary to aspiration ammonia and probable superimposed chronic obstructive pulmonary disease exacerbation. Continue antibiotic therapy. She appears stabilized from a respiratory standpoint. 2. Acute encephalopathy. Patient having a steep functional decline, likely multifactorial with underlying infectious process, dehydration and hypernatremia contributing. Today she did say a few words to me, is awake alert.  3. Dehydration. Improved with IV fluid hydration. Family requesting PICC placement for patient to receive IV fluids at home.  4. Aspiration. Multiple family discussions held, plan for PICC line placement for hydration, PO intake as tolerated. 5. Severe protein calorie malnutrition. Patient with history of CVA and chronic aspiration. Family wishing to continue oral feeds as tolerated.  6. Hypokalemia. Resolved, patient receiving potassium supplementation 7. Paroxysmal atrial fibrillation. Patient on anticoagulation, pharmacy consulted for Coumadin dosing.  Code Status: DO NOT RESUSCITATE Family Communication: Spoke with Patient's daughter over telephone conversation, patient not having a PCP in community and asking if her cardiologist can manage fluids.  Disposition Plan: PICC line placement today   Consultants:  Cardiology  Pulmonary critical care  GI   Antibiotics:  Vancomycin, discontinue 12/06/2012  Cefepime, discontinue 12/06/2012  Augmentin, starting 12/06/2012  HPI/Subjective: Patient has not changed clinically since yesterday. She is awake, and did say a few words to me today. She also noted a properly to several my questions.  Objective: Filed Vitals:   12/09/12 0615   BP: 97/48  Pulse: 72  Temp: 97.2 F (36.2 C)  Resp: 16    Intake/Output Summary (Last 24 hours) at 12/09/12 1328 Last data filed at 12/09/12 0900  Gross  per 24 hour  Intake    950 ml  Output      0 ml  Net    950 ml   Filed Weights   12/07/12 0530 12/08/12 0538 12/09/12 0615  Weight: 56.8 kg (125 lb 3.5 oz) 55.9 kg (123 lb 3.8 oz) 65 kg (143 lb 4.8 oz)    Exam:   General:  Thin-appearing, chronically ill, however awake. Nonverbal.   Cardiovascular: 2/6 systolic ejection murmur, regular rate and rhythm normal S1-S2  Respiratory: By basilar crackles noted, scattered expiratory wheezes, normal respiratory effort on supplemental oxygen  Abdomen: Soft nontender nondistended positive bowel sounds  Extremities: Patient having 1+ bilateral extremity pitting edema  Data Reviewed: Basic Metabolic Panel:  Recent Labs Lab 12/05/12 0842 12/05/12 1618 12/06/12 0700 12/07/12 0550 12/08/12 0655  NA 154* 152* 151* 148* 142  K 4.3 3.6 3.3* 3.0* 4.0  CL 115* 114* 114* 110 107  CO2 30 29 30  32 31  GLUCOSE 84 113* 123* 118* 112*  BUN 51* 49* 39* 34* 34*  CREATININE 0.95 0.95 0.79 0.81 0.86  CALCIUM 8.4 8.4 8.4 8.2* 8.1*   Liver Function Tests:  Recent Labs Lab 12/04/12 0009 12/05/12 0500  AST 114* 61*  ALT 68* 49*  ALKPHOS 586* 452*  BILITOT 1.1 1.2  PROT 8.2 7.4  ALBUMIN 2.2* 2.0*   No results found for this basename: LIPASE, AMYLASE,  in the last 168 hours No results found for this basename: AMMONIA,  in the last 168 hours CBC:  Recent Labs Lab 12/04/12 0009 12/04/12 0925 12/05/12 0842 12/06/12 0700 12/07/12 0550  WBC 14.9* 14.9* 12.4* 8.8 6.6  NEUTROABS 11.5*  --   --   --   --   HGB 12.7 10.3* 10.4* 10.8* 9.6*  HCT 40.4 32.0* 32.1* 34.6* 30.6*  MCV 99.5 98.5 98.5 98.9 98.7  PLT 133* 124* 140* 97* 94*   Cardiac Enzymes:  Recent Labs Lab 12/04/12 0115 12/04/12 0345  CKTOTAL 39  --   CKMB 4.6*  --   TROPONINI  --  <0.30   BNP (last 3  results)  Recent Labs  11/14/12 0928 11/26/12 1647 12/04/12 0009  PROBNP 3007.0* 254.0* 5031.0*   CBG:  Recent Labs Lab 12/03/12 2335  GLUCAP 129*    Recent Results (from the past 240 hour(s))  MRSA PCR SCREENING     Status: None   Collection Time    12/04/12  5:17 AM      Result Value Range Status   MRSA by PCR NEGATIVE  NEGATIVE Final   Comment:            The GeneXpert MRSA Assay (FDA     approved for NASAL specimens     only), is one component of a     comprehensive MRSA colonization     surveillance program. It is not     intended to diagnose MRSA     infection nor to guide or     monitor treatment for     MRSA infections.  CULTURE, BLOOD (ROUTINE X 2)     Status: None   Collection Time    12/04/12  9:00 AM      Result Value Range Status   Specimen Description BLOOD LEFT ANTECUBITAL   Final   Special Requests BOTTLES DRAWN AEROBIC AND ANAEROBIC 10CC   Final   Culture  Setup Time     Final   Value: 12/04/2012 16:53     Performed at [facility]  Culture     Final   Value:        BLOOD CULTURE RECEIVED NO GROWTH TO DATE CULTURE WILL BE HELD FOR 5 DAYS BEFORE ISSUING A FINAL NEGATIVE REPORT     Performed at [facility]   Report Status PENDING   Incomplete  CULTURE, BLOOD (ROUTINE X 2)     Status: None   Collection Time    12/04/12  9:25 AM      Result Value Range Status   Specimen Description BLOOD HAND RIGHT   Final   Special Requests BOTTLES DRAWN AEROBIC ONLY 5CC   Final   Culture  Setup Time     Final   Value: 12/04/2012 16:53     Performed at [facility]   Culture     Final   Value:        BLOOD CULTURE RECEIVED NO GROWTH TO DATE CULTURE WILL BE HELD FOR 5 DAYS BEFORE ISSUING A FINAL NEGATIVE REPORT     Performed at [facility]   Report Status PENDING   Incomplete  URINE CULTURE     Status: None   Collection Time    12/04/12 10:45 PM      Result Value Range Status   Specimen Description URINE, CATHETERIZED    Final   Special Requests NONE   Final   Culture  Setup Time     Final   Value: 12/05/2012 05:05     Performed at [facility] Count     Final   Value: NO GROWTH     Performed at [facility]   Culture     Final   Value: NO GROWTH     Performed at [facility]   Report Status 12/06/2012 FINAL   Final     Studies: No results found.  Scheduled Meds: . amiodarone  100 mg Oral Daily  . amoxicillin-clavulanate  1 tablet Oral BID  . antiseptic oral rinse  15 mL Mouth Rinse BID  . enoxaparin (LOVENOX) injection  1 mg/kg Subcutaneous Q12H  . tobramycin-dexamethasone  1 drop Both Eyes Q6H   Continuous Infusions: . dextrose 50 mL/hr at 12/07/12 2252    Principal Problem:   Sepsis Active Problems:   Long term (current) use of anticoagulants   Paroxysmal atrial fibrillation   Aspiration pneumonia   Acute encephalopathy   Dehydration   Elevated LFTs   Hypertrophic obstructive cardiomyopathy(425.11)   Mitral regurgitation   Protein-calorie malnutrition, severe   H/O cardiac pacemaker   Elevated troponin   Acute respiratory failure with hypoxia   Supratherapeutic INR   Feeding difficulties and mismanagement    Time spent: 35 minutes    Jeralyn Bennett  [facility] Pager [phone] 7PM-7AM, please contact night-coverage at [email or web address], password [facility] 12/09/2012, 1:28 PM  LOS: 6 days

## 2012-12-10 DIAGNOSIS — M7989 Other specified soft tissue disorders: Secondary | ICD-10-CM

## 2012-12-10 LAB — CULTURE, BLOOD (ROUTINE X 2): Culture: NO GROWTH

## 2012-12-10 LAB — CBC
Hemoglobin: 8.4 g/dL — ABNORMAL LOW (ref 12.0–15.0)
MCH: 31 pg (ref 26.0–34.0)
MCHC: 31.8 g/dL (ref 30.0–36.0)
MCV: 97.4 fL (ref 78.0–100.0)
Platelets: 97 10*3/uL — ABNORMAL LOW (ref 150–400)
RDW: 16 % — ABNORMAL HIGH (ref 11.5–15.5)
WBC: 9.1 10*3/uL (ref 4.0–10.5)

## 2012-12-10 LAB — PROTIME-INR
INR: 2 — ABNORMAL HIGH (ref 0.00–1.49)
Prothrombin Time: 22.1 seconds — ABNORMAL HIGH (ref 11.6–15.2)

## 2012-12-10 MED ORDER — SODIUM CHLORIDE 0.9 % IJ SOLN: 10.0000 mL | Freq: Two times a day (BID) | INTRAMUSCULAR | Status: DC

## 2012-12-10 MED ORDER — SODIUM CHLORIDE 0.9 % IJ SOLN
10.0000 mL | INTRAMUSCULAR | Status: DC | PRN
  Administered 2012-12-11 (×2): 10 mL

## 2012-12-10 NOTE — Progress Notes (Addendum)
ANTICOAGULATION CONSULT NOTE - Follow Up Consult  Pharmacy Consult for Lovenox/Coumadin Indication: atrial fibrillation  Allergies  Allergen Reactions  . Codeine Other (See Comments)    REACTION: unknown  . Sulfonamide Derivatives Other (See Comments)    REACTION:  unknown    Patient Measurements: Height: 5\' 5"  (165.1 cm) Weight: 143 lb 4.8 oz (65 kg) IBW/kg (Calculated) : 57 Heparin Dosing Weight:    Vital Signs: Temp: 97.4 F (36.3 C) (10/15 0622) Temp src: Axillary (10/15 0622) BP: 117/46 mmHg (10/15 0622) Pulse Rate: 60 (10/15 0622)  Labs:  Recent Labs  12/08/12 0655 12/09/12 0805 12/10/12 0615  HGB  --   --  8.4*  HCT  --   --  26.4*  PLT  --   --  97*  LABPROT 19.3* 20.9* 22.1*  INR 1.68* 1.86* 2.00*  CREATININE 0.86  --   --     Estimated Creatinine Clearance: 37.6 ml/min (by C-G formula based on Cr of 0.86).  Assessment: AMS  PMH: CVA (nonverbal), HTN, COPD, PAF s/p PPM, hx MI  AC: Lovenox for afib (coumadin now on hold for PEG placement) - INR 2 up despite no Coumadin, H/H 8.4/26/4 down, plts 97 (low since admission) - recent admit 9/19-9/26. Discharged on amiodarone (being loaded). 10/9 Admission INR >10. INR could be elevated due to interaction with Amiodarone and possibly (less likely) Augmentin. RN sticky note reports right arm, swollen,warm to touch and ecchymotic/has hematoma.  Goal of Therapy:  Anti-Xa level 0.6-1.2 units/ml 4hrs after LMWH dose given Monitor platelets by anticoagulation protocol: Yes   Plan:  - What are the plans for PEG and resuming Coumadin?? -  Continue Lovenox 55mg  SQ Q12h unless INR>2 - CBC Q72H while on lovenox   Adden (1322): Order to resume Coumadin. Will not give Coumadin this PM with INR trending up but will check INR daily and continue to monitor.   Noreene Boreman S. Merilynn Finland, PharmD, [facility] Clinical Staff Pharmacist Pager [phone]  Misty Stanley Stillinger 12/10/2012,9:19 AM

## 2012-12-10 NOTE — Progress Notes (Signed)
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 At that time she presented with mental status changes, significant functional decline, excessive daytime sleepiness and not interacting with caregivers. She was found to be hypothermic, hypotensive, with acute renal failure, hypernatremia and a supratherapeutic INR greater than 10. There was concerns for sepsis as she was initially admitted to the step down unit.  With her history of CHF and elevated troponin of 0.18 cardiology was consulted during this hospitalization. Cardiology felt that point of care troponin could reflect demand ischemia rather than acute coronary syndrome. They recommended continuing medical management. There was also concern for aspiration pneumonia given her history for which she was started on broad-spectrum empiric antibiotic therapy with cefepime and vancomycin. Acute encephalopathy likely multifactorial with underlying sepsis, aspiration pneumonia, profound dehydration, hypernatremia, and acute renal failure all contributors. She was transferred out of the step down unit on 12/05/2012. I had an extensive discussion with family members regarding goals of care. Family wishing for PEG tube to be placed and requested consultation from Dr. Juanda Chance of gastroenterology. I went over the potential burdens of PEG tube including wound infections, aspiration, obstruction of feeding tubes, and pain. Dr. Juanda Chance had a conference with daughters regarding nutritional support, as it was decided to pursue PICC line placement rather than placing PEG tube. Patient underwent  PICC line placement by interventional radiology.   Assessment/Plan: 1. Acute on chronic respiratory failure. Likely secondary to aspiration pna and probably superimposed chronic obstructive pulmonary disease exacerbation. Continue antibiotic therapy. She appears stabilized from a respiratory standpoint. 2. Acute encephalopathy. Patient having a steep functional decline, likely multifactorial with underlying infectious process, dehydration and hypernatremia contributing. Patient alert.  3. Dehydration. Improved with IV fluid hydration. Family discussed with Dr Juanda Chance plan is to go home with PICC for patient to receive IV fluids at home.  4. Aspiration. Multiple family discussions held, plan for PICC line placement for hydration, PO intake as tolerated. 5. Severe protein calorie malnutrition. Patient with history of CVA and chronic aspiration. Family wishing to continue oral feeds as tolerated.  6. Hypokalemia. Resolved, patient receiving potassium supplementation 7. Paroxysmal atrial fibrillation. Patient on anticoagulation, pharmacy consulted for Coumadin dosing. Will dc Lovenox. Resume coumadin. Monitor hb.  8. Right arm Hematoma: repeat Hb in am. Will need to be cautious with anticoagulation doses. Patient will need close follow up.   Code Status: DO NOT RESUSCITATE Family Communication: Spoke with Patient's daughter over telephone conversation, patient not having a PCP in community and asking if her cardiologist can manage fluids.  Disposition Plan: PICC line placement today   Consultants:  Cardiology  Pulmonary critical care  GI   Antibiotics:  Vancomycin, discontinue 12/06/2012  Cefepime, discontinue 12/06/2012  Augmentin, starting 12/06/2012  HPI/Subjective: Patient alert, awake. No complaints, although care giver relates patient was complaining of pain.   Objective: Filed Vitals:   12/10/12 1325  BP: 137/63  Pulse: 63  Temp: 97.5 F (36.4 C)  Resp: 19     Intake/Output Summary (Last 24 hours) at 12/10/12 1413 Last data filed at 12/10/12 0600  Gross per 24 hour  Intake   1200 ml  Output      0 ml  Net   1200 ml   Filed Weights   12/07/12 0530 12/08/12 0538 12/09/12 0615  Weight: 56.8 kg (125 lb 3.5 oz) 55.9 kg (123 lb 3.8 oz) 65 kg (143 lb 4.8 oz)    Exam:   General:  Thin-appearing, chronically ill, however awake. Nonverbal.   Cardiovascular: 2/6 systolic ejection murmur, regular rate and rhythm normal S1-S2  Respiratory: By basilar crackles noted, scattered expiratory wheezes, normal respiratory effort on supplemental oxygen  Abdomen: Soft nontender nondistended positive bowel sounds  Extremities: Patient having 1+ bilateral extremity pitting edema  Data Reviewed: Basic Metabolic Panel:  Recent Labs Lab 12/05/12 0842 12/05/12 1618 12/06/12 0700 12/07/12 0550 12/08/12 0655  NA 154* 152* 151* 148* 142  K 4.3 3.6 3.3* 3.0* 4.0  CL 115* 114* 114* 110 107  CO2 30 29 30  32 31  GLUCOSE 84 113* 123* 118* 112*  BUN 51* 49* 39* 34* 34*  CREATININE 0.95 0.95 0.79 0.81 0.86  CALCIUM 8.4 8.4 8.4 8.2* 8.1*   Liver Function Tests:  Recent Labs Lab 12/04/12 0009 12/05/12 0500  AST 114* 61*  ALT 68* 49*  ALKPHOS 586* 452*  BILITOT 1.1 1.2  PROT 8.2 7.4  ALBUMIN 2.2* 2.0*   No results found for this basename: LIPASE, AMYLASE,  in the last 168 hours No results found for this basename: AMMONIA,  in the last 168 hours CBC:  Recent Labs Lab 12/04/12 0009 12/04/12 0925 12/05/12 0842 12/06/12 0700 12/07/12 0550 12/10/12 0615  WBC 14.9* 14.9* 12.4* 8.8 6.6 9.1  NEUTROABS 11.5*  --   --   --   --   --   HGB 12.7 10.3* 10.4* 10.8* 9.6* 8.4*  HCT 40.4 32.0* 32.1* 34.6* 30.6* 26.4*  MCV 99.5 98.5 98.5 98.9 98.7 97.4  PLT 133* 124* 140* 97* 94* 97*   Cardiac Enzymes:  Recent Labs Lab 12/04/12 0115 12/04/12 0345  CKTOTAL 39  --   CKMB 4.6*  --   TROPONINI  --  <0.30   BNP (last 3 results)  Recent Labs   11/14/12 0928 11/26/12 1647 12/04/12 0009  PROBNP 3007.0* 254.0* 5031.0*   CBG:  Recent Labs Lab 12/03/12 2335  GLUCAP 129*    Recent Results (from the past 240 hour(s))  MRSA PCR SCREENING     Status: None   Collection Time    12/04/12  5:17 AM      Result Value Range Status   MRSA by PCR NEGATIVE  NEGATIVE Final   Comment:            The GeneXpert MRSA Assay (FDA     approved for NASAL specimens     only), is one component of a     comprehensive MRSA colonization     surveillance program. It is not     intended to diagnose MRSA     infection nor to guide or     monitor treatment for     MRSA infections.  CULTURE, BLOOD (ROUTINE X 2)     Status: None   Collection Time    12/04/12  9:00 AM      Result Value Range Status   Specimen Description BLOOD LEFT ANTECUBITAL   Final   Special Requests BOTTLES DRAWN AEROBIC AND ANAEROBIC 10CC   Final   Culture  Setup Time     Final   Value: 12/04/2012 16:53     Performed at [facility]   Culture     Final   Value:  NO GROWTH 5 DAYS     Performed at [facility]   Report Status 12/10/2012 FINAL   Final  CULTURE, BLOOD (ROUTINE X 2)     Status: None   Collection Time    12/04/12  9:25 AM      Result Value Range Status   Specimen Description BLOOD HAND RIGHT   Final   Special Requests BOTTLES DRAWN AEROBIC ONLY 5CC   Final   Culture  Setup Time     Final   Value: 12/04/2012 16:53     Performed at [facility]   Culture     Final   Value: NO GROWTH 5 DAYS     Performed at [facility]   Report Status 12/10/2012 FINAL   Final  URINE CULTURE     Status: None   Collection Time    12/04/12 10:45 PM      Result Value Range Status   Specimen Description URINE, CATHETERIZED   Final   Special Requests NONE   Final   Culture  Setup Time     Final   Value: 12/05/2012 05:05     Performed at [facility] Count     Final   Value: NO GROWTH     Performed at [facility]    Culture     Final   Value: NO GROWTH     Performed at [facility]   Report Status 12/06/2012 FINAL   Final     Studies: Ir Fluoro Guide Cv Line Left  12/10/2012   CLINICAL DATA:  79 year old female with malnutrition and failure to thrive. She requires terrible central venous access for hydration and nutrition.  EXAM: IR LEFT FLUORO GUIDE CV LINE; IR ULTRASOUND GUIDANCE VASC ACCESS LEFT  TECHNIQUE: The left arm was prepped with chlorhexidine, draped in the usual sterile fashion using maximum barrier technique (cap and mask, sterile gown, sterile gloves, large sterile sheet, hand hygiene and cutaneous antiseptic). Local anesthesia was attained by infiltration with 1% lidocaine.  Ultrasound demonstrated patency of the left brachial vein, and this was documented with an image. Under real-time ultrasound guidance, this vein was accessed with a 21 gauge micropuncture needle and image documentation was performed. The needle was exchanged over a guidewire for a peel-away sheath through which a 42 cm 5 [place] single lumen power injectable PICC was advanced, and positioned with its tip at the lower SVC/right atrial junction. Fluoroscopy during the procedure and fluoro spot radiograph confirms appropriate catheter position. The catheter was flushed, secured to the skin with Prolene sutures, and covered with a sterile dressing.  FLUOROSCOPY TIME:  3 min 56 seconds minutes.  COMPLICATIONS: None.  The patient tolerated the procedure well.  IMPRESSION: Successful placement of a left arm PICC with sonographic and fluoroscopic guidance. The catheter is ready for use.  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 Additionally, the right upper extremity is extremely edematous and bruised and was not suitable for PICC placement.  Signed,  Sterling Big, MD  [facility]  [facility]   Electronically Signed   By:  Malachy Moan M.D.   On: 12/10/2012 07:29   Ir US Guide Vasc Access Left  12/10/2012   CLINICAL DATA:  79 year old female with malnutrition and failure to thrive. She requires terrible central venous access for hydration and nutrition.  EXAM: IR LEFT FLUORO GUIDE CV LINE;  IR ULTRASOUND GUIDANCE VASC ACCESS LEFT  TECHNIQUE: The left arm was prepped with chlorhexidine, draped in the usual sterile fashion using maximum barrier technique (cap and mask, sterile gown, sterile gloves, large sterile sheet, hand hygiene and cutaneous antiseptic). Local anesthesia was attained by infiltration with 1% lidocaine.  Ultrasound demonstrated patency of the left brachial vein, and this was documented with an image. Under real-time ultrasound guidance, this vein was accessed with a 21 gauge micropuncture needle and image documentation was performed. The needle was exchanged over a guidewire for a peel-away sheath through which a 42 cm 5 [place] single lumen power injectable PICC was advanced, and positioned with its tip at the lower SVC/right atrial junction. Fluoroscopy during the procedure and fluoro spot radiograph confirms appropriate catheter position. The catheter was flushed, secured to the skin with Prolene sutures, and covered with a sterile dressing.  FLUOROSCOPY TIME:  3 min 56 seconds minutes.  COMPLICATIONS: None.  The patient tolerated the procedure well.  IMPRESSION: Successful placement of a left arm PICC with sonographic and fluoroscopic guidance. The catheter is ready for use.  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 Additionally, the right upper extremity is extremely edematous and bruised and was not suitable for PICC placement.  Signed,  Sterling Big, MD  [facility]  [facility]   Electronically Signed   By: Malachy Moan M.D.   On: 12/10/2012 07:29    Scheduled Meds: . amiodarone  100 mg  Oral Daily  . amoxicillin-clavulanate  1 tablet Oral BID  . antiseptic oral rinse  15 mL Mouth Rinse BID  . sodium chloride  10-40 mL Intracatheter Q12H  . tobramycin-dexamethasone  1 drop Both Eyes Q6H   Continuous Infusions: . dextrose 50 mL/hr at 12/09/12 1820    Principal Problem:   Sepsis Active Problems:   Long term (current) use of anticoagulants   Paroxysmal atrial fibrillation   Aspiration pneumonia   Acute encephalopathy   Dehydration   Elevated LFTs   Hypertrophic obstructive cardiomyopathy(425.11)   Mitral regurgitation   Protein-calorie malnutrition, severe   H/O cardiac pacemaker   Elevated troponin   Acute respiratory failure with hypoxia   Supratherapeutic INR   Feeding difficulties and mismanagement    Time spent: 25 minutes    REGALADO,BELKYS  [facility] Pager [phone] 7PM-7AM, please contact night-coverage at [email or web address], password [email or web address] 12/10/2012, 2:13 PM  LOS: 7 days

## 2012-12-10 NOTE — Progress Notes (Signed)
Right upper extremity venous duplex:  No evidence of DVT or superficial thrombosis.    

## 2012-12-10 NOTE — Progress Notes (Addendum)
12-10-12 Baxter Hire with [facility] aware plan is to discharge patient today on D5 @ 50 cc/ hr . Baxter Hire will arrange IVF through [facility] . Ronny Flurry RN BSN [phone]     12-10-12 Robin with  [facility], returned call and confirmed DR Irena Cords will sign orders for home health PICC / IVF .  [facility] will need to notified when patient is discharged , patient will be seen by MD within 48 hours of discharge .   Ronny Flurry RN BSN [phone]     12-10-12 Confirmed with Rica Koyanagi at Dr Odessa Fleming office , Dr Graciela Husbands is signing off,  therefore will not be signing home health orders . Called patient's daughter Rod Holler [phone] to discuss need of PCP . [place] stated that DR Wyvonnia Lora with [facility] [phone]  will sign orders . Called DR Wyvonnia Lora to confirm, awaiting call back.   Ronny Flurry RN BSN [phone]

## 2012-12-11 LAB — PROTIME-INR: INR: 2.1 — ABNORMAL HIGH (ref 0.00–1.49)

## 2012-12-11 LAB — HEMOGLOBIN AND HEMATOCRIT, BLOOD: Hemoglobin: 8.4 g/dL — ABNORMAL LOW (ref 12.0–15.0)

## 2012-12-11 MED ORDER — TOBRAMYCIN-DEXAMETHASONE 0.3-0.1 % OP SUSP: 1.0000 [drp] | Freq: Four times a day (QID) | OPHTHALMIC | Status: DC

## 2012-12-11 MED ORDER — AMOXICILLIN-POT CLAVULANATE 500-125 MG PO TABS: 1.0000 | ORAL_TABLET | Freq: Two times a day (BID) | ORAL | Status: DC

## 2012-12-11 MED ORDER — HEPARIN SOD (PORK) LOCK FLUSH 100 UNIT/ML IV SOLN
250.0000 [IU] | INTRAVENOUS | Status: AC | PRN
  Administered 2012-12-11: 250 [IU]

## 2012-12-11 MED ORDER — DEXTROSE 5 % IV SOLN: 30.0000 mL | INTRAVENOUS | Status: DC

## 2012-12-11 MED ORDER — DEXTROSE 5 % IV SOLN: INTRAVENOUS | Status: DC

## 2012-12-11 MED ORDER — AMIODARONE HCL 100 MG PO TABS: 100.0000 mg | ORAL_TABLET | Freq: Every day | ORAL | Status: DC

## 2012-12-11 NOTE — Progress Notes (Signed)
CSW (Clinical Child psychotherapist) informed that pt is ready for dc and needs non-emergent ambulance home. Pt nurse confirmed address with caregiver in pt room. CSW arranged ambulance transport and made nurse aware. CSW signing off.  Marquitta Persichetti, LCSWA [phone]

## 2012-12-11 NOTE — Progress Notes (Signed)
NURSING PROGRESS NOTE  Dawn Barrett [id number] Discharge Data: 12/11/2012 3:08 PM Attending Provider: Alba Cory, MD WUJ:[id number], Provider, MD     Marcella Dubs to be D/C'd Home per MD order.  Discussed with the patient the After Visit Summary and all questions fully answered. All belongings returned to patient for patient to take home. Patient's personal aid was at the bedside at the time of d/c home. D/C summary was gone over with the aid. Md. Sunnie Nielsen was paged to verify d/c coumadin instructions. Aid was informed not to give coumadin tonight and wait until INR labs are drawn and sent to PCP to dose future coumadin administration. Patient d/c'd with PICC for hydration.   Last Vital Signs:  Blood pressure 145/72, pulse 64, temperature 97.5 F (36.4 C), temperature source Oral, resp. rate 20, height 5\' 5"  (1.651 m), weight 65 kg (143 lb 4.8 oz), SpO2 99.00%.  Discharge Medication List   Medication List    STOP taking these medications       warfarin 1 MG tablet  Commonly known as:  COUMADIN      TAKE these medications       amiodarone 100 MG tablet  Commonly known as:  PACERONE  Take 1 tablet (100 mg total) by mouth daily.     amoxicillin-clavulanate 500-125 MG per tablet  Commonly known as:  AUGMENTIN  Take 1 tablet (500 mg total) by mouth 2 (two) times daily.     cholecalciferol 1000 UNITS tablet  Commonly known as:  VITAMIN D  Take 2,000 Units by mouth daily.     Cyanocobalamin 1000 MCG/15ML Liqd  Take 1,000 mcg by mouth daily.     dextrose 5 % solution  Inject 30 mLs into the vein continuous.     levalbuterol 0.63 MG/3ML nebulizer solution  Commonly known as:  XOPENEX  Take 3 mLs (0.63 mg total) by nebulization every 6 (six) hours as needed for wheezing.     nitroGLYCERIN 0.4 MG SL tablet  Commonly known as:  NITROSTAT  Place 1 tablet (0.4 mg total) under the tongue every 5 (five) minutes x 3 doses as needed for chest pain.     nystatin  100000 UNIT/GM Powd  Apply 1 g topically daily as needed (to affected area).     tobramycin-dexamethasone ophthalmic solution  Commonly known as:  TOBRADEX  Place 1 drop into both eyes every 6 (six) hours.     vitamin C 500 MG tablet  Commonly known as:  ASCORBIC ACID  Take 500 mg by mouth daily.

## 2012-12-11 NOTE — Progress Notes (Signed)
ANTICOAGULATION CONSULT NOTE - Follow Up Consult  Pharmacy Consult for Coumadin Indication: atrial fibrillation  Allergies  Allergen Reactions  . Codeine Other (See Comments)    REACTION: unknown  . Sulfonamide Derivatives Other (See Comments)    REACTION:  unknown    Patient Measurements: Height: 5\' 5"  (165.1 cm) Weight: 143 lb 4.8 oz (65 kg) IBW/kg (Calculated) : 57 Heparin Dosing Weight:    Vital Signs: Temp: 97.5 F (36.4 C) (10/16 0615) Temp src: Oral (10/16 0615) BP: 145/72 mmHg (10/16 0615) Pulse Rate: 64 (10/16 0615)  Labs:  Recent Labs  12/09/12 0805 12/10/12 0615 12/11/12 0525  HGB  --  8.4* 8.4*  HCT  --  26.4* 26.3*  PLT  --  97*  --   LABPROT 20.9* 22.1* 22.9*  INR 1.86* 2.00* 2.10*    Estimated Creatinine Clearance: 37.6 ml/min (by C-G formula based on Cr of 0.86).   Assessment: AMS  PMH: CVA (nonverbal), HTN, COPD, PAF s/p PPM, hx MI  AC: Afib. INR 2.1 up despite no Coumadin, H/H 8.4/26.4 down, plts 97 (low since admission) - recent admit 9/19-9/26. Discharged on amiodarone (being loaded). 10/9 Admission INR >10. INR could be elevated due to interaction with Amiodarone and possibly (less likely) Augmentin. R arm has hematoma. No DVT on dopplers. Need to recheck LFTs   Goal of Therapy:  INR 2-3 Monitor platelets by anticoagulation protocol: Yes   Plan:  D/c Lovenox. Continue to hold Coumadin as INR trending up on its own. Daily INR. Recheck LFTs.   Nicollette Wilhelmi S. Merilynn Finland, PharmD, [facility] Clinical Staff Pharmacist Pager [phone]  Misty Stanley Stillinger 12/11/2012,9:13 AM

## 2012-12-11 NOTE — Discharge Summary (Signed)
Physician Discharge Summary  Dawn Barrett WUJ:[id number] DOB: September 26, 1918 DOA: 12/03/2012  PCP: Default, Provider, MD  Admit date: 12/03/2012 Discharge date: 12/11/2012  Time spent: 35 minutes  Recommendations for Outpatient Follow-up:  1. Needs to follow up with PCP within 24 hours for IV fluids and medical care.  2. Need INR to determine coumadin doses.  3. Need hb to follow stability.  4. Need bmet to follow sodium level.   Discharge Diagnoses:    Sepsis   Feeding difficulties and mismanagement   Long term (current) use of anticoagulants   Paroxysmal atrial fibrillation   Aspiration pneumonia   Acute encephalopathy   Dehydration   Elevated LFTs   Hypertrophic obstructive cardiomyopathy(425.11)   Mitral regurgitation   Protein-calorie malnutrition, severe   H/O cardiac pacemaker   Elevated troponin   Acute respiratory failure with hypoxia   Supratherapeutic INR     Discharge Condition: stable.   Diet recommendation: Dysphagia 1 diet.   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 At that time she presented with mental status changes, significant functional decline, excessive daytime sleepiness and not interacting with caregivers. She was found to be hypothermic, hypotensive, with acute renal failure, hypernatremia and a supratherapeutic INR greater than 10. There was concerns for sepsis as she was initially admitted to the step down unit. With her history of CHF and elevated troponin of 0.18 cardiology was consulted during this  hospitalization. Cardiology felt that point of care troponin could reflect demand ischemia rather than acute coronary syndrome. They recommended continuing medical management. There was also concern for aspiration pneumonia given her history for which she was started on broad-spectrum empiric antibiotic therapy with cefepime and vancomycin. Acute encephalopathy likely multifactorial with underlying sepsis, aspiration pneumonia, profound dehydration, hypernatremia, and acute renal failure all contributors. She was transferred out of the step down unit on 12/05/2012. I had an extensive discussion with family members regarding goals of care. Family wishing for PEG tube to be placed and requested consultation from Dr. Juanda Chance of gastroenterology. Dr Milagros Loll  went over the potential burdens of PEG tube including wound infections, aspiration, obstruction of feeding tubes, and pain.  Dr. Juanda Chance had a conference with daughters regarding nutritional support, as it was decided to pursue PICC line placement rather than placing PEG tube. Patient underwent PICC line placement by interventional radiology. IV fluids will be manage by patient ' PCP. Will decrease IV fluids to 30 cc to avoid overload. Daughter refused palliative care consult. Daughter aware of risk of PICC line like infection, clot.    Hospital Course:  Acute on chronic respiratory failure. Likely secondary to aspiration pna and probably superimposed chronic obstructive pulmonary disease exacerbation. Continue antibiotic therapy. She appears stabilized from a respiratory standpoint. Oxygen saturation 99 on 2 l.   Acute encephalopathy. Patient having a steep functional decline, likely multifactorial with underlying infectious process, dehydration and hypernatremia contributing. Patient alert.   Dehydration/hypernatremia. Improved with IV fluid hydration. Family discussed with Dr Juanda Chance plan is to go home with PICC for patient to receive IV fluids at home.  PCP  will evaluate patient in 24 hours to determine rate and continuation of fluids. Need bmet to follow sodium level.   Aspiration. Multiple family discussions held, plan for PICC line placement for hydration, PO intake as tolerated.   Severe protein calorie malnutrition. Patient with history of CVA and chronic aspiration. Family wishing to continue oral feeds as tolerated.   Hypokalemia. Resolved, patient receiving potassium supplementation  Paroxysmal atrial fibrillation. Patient on anticoagulation, pharmacy consulted for Coumadin dosing. Will dc Lovenox. Hold  Coumadin today, repeat INR 10-17 and adjust dose as needed. . Monitor hb.  Right arm Hematoma: repeat Hb in am stable.  Will need to be cautious with anticoagulation doses. Patient will need close follow up.    Procedures:  Doppler negative for DVT  Consultations:  Dr Juanda Chance.   Discharge Exam: Filed Vitals:   12/11/12 0615  BP: 145/72  Pulse: 64  Temp: 97.5 F (36.4 C)  Resp: 20    General: No distress.  Cardiovascular: S 1, S 2 RRR Respiratory:CTA Extremities; trace edema, right arm with hematoma.   Discharge Instructions  Discharge Orders   Future Appointments Provider Department Dept Phone   01/07/2013 10:00 AM [facility] 1 [facility] [place] Office [phone]   02/02/2013 3:30 PM [facility] 1 [facility] [employer] [phone]   Future Orders Complete By Expires   Diet general  As directed    Increase activity slowly  As directed        Medication List    STOP taking these medications       warfarin 1 MG tablet  Commonly known as:  COUMADIN      TAKE these medications       amiodarone 100 MG tablet  Commonly known as:  PACERONE  Take 1 tablet (100 mg total) by mouth daily.     amoxicillin-clavulanate 500-125 MG per tablet  Commonly known as:  AUGMENTIN  Take 1 tablet (500 mg total) by mouth 2 (two) times daily.     cholecalciferol 1000 UNITS tablet  Commonly  known as:  VITAMIN D  Take 2,000 Units by mouth daily.     Cyanocobalamin 1000 MCG/15ML Liqd  Take 1,000 mcg by mouth daily.     dextrose 5 % solution  Inject 30 mLs into the vein continuous.     levalbuterol 0.63 MG/3ML nebulizer solution  Commonly known as:  XOPENEX  Take 3 mLs (0.63 mg total) by nebulization every 6 (six) hours as needed for wheezing.     nitroGLYCERIN 0.4 MG SL tablet  Commonly known as:  NITROSTAT  Place 1 tablet (0.4 mg total) under the tongue every 5 (five) minutes x 3 doses as needed for chest pain.     nystatin 100000 UNIT/GM Powd  Apply 1 g topically daily as needed (to affected area).     tobramycin-dexamethasone ophthalmic solution  Commonly known as:  TOBRADEX  Place 1 drop into both eyes every 6 (six) hours.     vitamin C 500 MG tablet  Commonly known as:  ASCORBIC ACID  Take 500 mg by mouth daily.       Allergies  Allergen Reactions  . Codeine Other (See Comments)    REACTION: unknown  . Sulfonamide Derivatives Other (See Comments)    REACTION:  unknown      The results of significant diagnostics from this hospitalization (including imaging, microbiology, ancillary and laboratory) are listed below for reference.    Significant Diagnostic Studies: Dg Chest 2 View  12/03/2012   *RADIOLOGY REPORT*  Clinical Data: Altered mental status.  CHEST - 2 VIEW  Comparison: Chest radiograph performed 11/19/2012  Findings: There has been interval improvement in bilateral airspace opacities.  Persistent small to moderate bilateral pleural effusions are seen, with associated airspace opacities possibly reflecting atelectasis or residual pneumonia.  No pneumothorax is seen, though the lung apices are partially obscured by the patient's head.  The cardiomediastinal silhouette is mildly enlarged.  A pacemaker is seen at the left chest wall, with leads ending at the right atrium and right ventricle.  Chronic right-sided rib deformities are seen.  There is a  relatively severe compression deformity at the mid to lower thoracic spine, new from 2013, though with a somewhat chronic appearance.  IMPRESSION:  1.  Interval improvement in bilateral airspace opacities; residual airspace opacities may reflect atelectasis or pneumonia. Persistent small to moderate bilateral pleural effusions seen. 2.  Mild cardiomegaly noted. 3.  Relatively severe compression deformity at the mid to lower thoracic spine, new from 2013, though with a somewhat chronic appearance.   Original Report Authenticated By: Tonia Ghent, M.D.   Ct Head Wo Contrast  12/04/2012   *RADIOLOGY REPORT*  Clinical Data: Altered mental status.  CT HEAD WITHOUT CONTRAST  Technique:  Contiguous axial images were obtained from the base of the skull through the vertex without contrast.  Comparison: CT of head October 27, 2007  Findings: Please note, as the patient is markedly kyphotic, initial scanning resulting in near coronal images, these were reformatted to the axial images. Moderately motion degraded examination.  No intraparenchymal hemorrhage, mass effect or midline shift.  Left frontal encephalomalacia with mineralization, corresponding to patient's known remote left middle cerebral artery territory infarct.  No hydrocephalous.  No midline shift or mass effect. Patchy to confluent supratentorial white matter hypodensities suggest sequelae of chronic small vessel ischemic disease.  No definite abnormal extra-axial fluid collections.  Mild right maxillary mucosal thickening mild paranasal sinus air-fluid levels. No depressed skull fracture.  IMPRESSION: Motion and habitus limited examination.  No convincing evidence of acute intracranial process.  Remote left middle cerebral artery territory infarct.  Moderate to severe white matter changes suggest sequelae of chronic small vessel ischemic disease.   Original Report Authenticated By: Awilda Metro   Dg Chest Bilateral Decubitus  11/14/2012   CLINICAL DATA:   Evaluate for pleural effusions. Shortness of breath.  EXAM: CHEST - BILATERAL DECUBITUS VIEW  COMPARISON:  11/14/2012 chest x-ray.  FINDINGS: Bilateral pleural effusions are noted to partially layered on decubitus views.  Findings suggestive of congestive heart failure. Sequential pacemaker is in place. Cardiomegaly. Tortuous aorta.  IMPRESSION: Bilateral pleural effusions are noted to partially layered on decubitus views.  Findings suggestive of congestive heart failure.  Sequential pacemaker is in place. Cardiomegaly.  Tortuous aorta.   Electronically Signed   By: Bridgett Larsson   On: 11/14/2012 16:57   Ir Fluoro Guide Cv Line Left  12/10/2012   CLINICAL DATA:  79 year old female with malnutrition and failure to thrive. She requires terrible central venous access for hydration and nutrition.  EXAM: IR LEFT FLUORO GUIDE CV LINE; IR ULTRASOUND GUIDANCE VASC ACCESS LEFT  TECHNIQUE: The left arm was prepped with chlorhexidine, draped in the usual sterile fashion using maximum barrier technique (cap and mask, sterile gown, sterile gloves, large sterile sheet, hand hygiene and cutaneous antiseptic). Local anesthesia was attained by infiltration with 1% lidocaine.  Ultrasound demonstrated patency of the left brachial vein, and this was documented with an  image. Under real-time ultrasound guidance, this vein was accessed with a 21 gauge micropuncture needle and image documentation was performed. The needle was exchanged over a guidewire for a peel-away sheath through which a 42 cm 5 [place] single lumen power injectable PICC was advanced, and positioned with its tip at the lower SVC/right atrial junction. Fluoroscopy during the procedure and fluoro spot radiograph confirms appropriate catheter position. The catheter was flushed, secured to the skin with Prolene sutures, and covered with a sterile dressing.  FLUOROSCOPY TIME:  3 min 56 seconds minutes.  COMPLICATIONS: None.  The patient tolerated the procedure well.   IMPRESSION: Successful placement of a left arm PICC with sonographic and fluoroscopic guidance. The catheter is ready for use.  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 Additionally, the right upper extremity is extremely edematous and bruised and was not suitable for PICC placement.  Signed,  Sterling Big, MD  [facility]  [facility]   Electronically Signed   By: Malachy Moan M.D.   On: 12/10/2012 07:29   Ir US Guide Vasc Access Left  12/10/2012   CLINICAL DATA:  80 year old female with malnutrition and failure to thrive. She requires terrible central venous access for hydration and nutrition.  EXAM: IR LEFT FLUORO GUIDE CV LINE; IR ULTRASOUND GUIDANCE VASC ACCESS LEFT  TECHNIQUE: The left arm was prepped with chlorhexidine, draped in the usual sterile fashion using maximum barrier technique (cap and mask, sterile gown, sterile gloves, large sterile sheet, hand hygiene and cutaneous antiseptic). Local anesthesia was attained by infiltration with 1% lidocaine.  Ultrasound demonstrated patency of the left brachial vein, and this was documented with an image. Under real-time ultrasound guidance, this vein was accessed with a 21 gauge micropuncture needle and image documentation was performed. The needle was exchanged over a guidewire for a peel-away sheath through which a 42 cm 5 [place] single lumen power injectable PICC was advanced, and positioned with its tip at the lower SVC/right atrial junction. Fluoroscopy during the procedure and fluoro spot radiograph confirms appropriate catheter position. The catheter was flushed, secured to the skin with Prolene sutures, and covered with a sterile dressing.  FLUOROSCOPY TIME:  3 min 56 seconds minutes.  COMPLICATIONS: None.  The patient tolerated the procedure well.  IMPRESSION: Successful placement of a left arm PICC with sonographic and fluoroscopic  guidance. The catheter is ready for use.  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 Additionally, the right upper extremity is extremely edematous and bruised and was not suitable for PICC placement.  Signed,  Sterling Big, MD  [facility]  [facility]   Electronically Signed   By: Malachy Moan M.D.   On: 12/10/2012 07:29   Dg Chest Port 1 View  11/19/2012   CLINICAL DATA:  Productive cough and fever  EXAM: PORTABLE CHEST - 1 VIEW  COMPARISON:  Chest x-ray 2 days prior  FINDINGS: Left approach dual-chamber pacer shows no interval displacement.  Unchanged cardiomegaly. Mediastinal contours are distorted by right rotation.  Moderate bilateral pleural effusions. Interstitial coarsening diffusely with asymmetric right perihilar opacity. Lung aeration is unchanged from 2 days prior.  IMPRESSION: 1. Asymmetric right-sided opacity which could represent pneumonia, unchanged from 2 days prior. 2. Moderate pleural effusions with mild pulmonary edema.   Electronically Signed   By: Tiburcio Pea   On: 11/19/2012  03:05   Dg Chest Port 1 View  11/17/2012   CLINICAL DATA:  Shortness of breast he  EXAM: PORTABLE CHEST - 1 VIEW  COMPARISON:  Single view of the chest 11/14/2012 and PA and lateral chest 07/01/2011.  FINDINGS: There is cardiomegaly. Airspace disease is again seen throughout the right chest. A milder degree of airspace disease is seen in the left mid and lower lung zones. There are bilateral pleural effusions.  IMPRESSION: Cardiomegaly with bilateral pleural effusions and right worse than left airspace disease which could represent asymmetric edema or pneumonia.   Electronically Signed   By: Drusilla Kanner M.D.   On: 11/17/2012 05:40   Dg Chest Portable 1 View  11/14/2012   CLINICAL DATA:  Shortness of Breath. Congestion. Pneumonia.  EXAM: PORTABLE CHEST - 1 VIEW  COMPARISON:  07/01/2011.   FINDINGS: Sequential pacemaker is in place entering from the left. Full extent of the leads not included. The portions which are visualized do not demonstrate gross interruption. There is narrowing of a proximal lead adjacent the battery pack. Tips appear to be in the region of the right atrium and right ventricle.  Marked cardiomegaly.  Diffuse asymmetric airspace disease with bilateral pleural effusions may represent pulmonary edema. Infectious infiltrate cannot be excluded in the proper clinical setting. Additionally, underlying mass could not be excluded given the pleural effusions and mediastinal prominence.  No gross pneumothorax.  Remote right-sided rib fractures.  IMPRESSION: Marked cardiomegaly with sequential pacemaker in place.  Asymmetric airspace disease with bilateral pleural effusions may represent pulmonary edema although infectious infiltrate cannot be excluded in proper clinical setting.  Follow-up in the clearance recommended.   Electronically Signed   By: Bridgett Larsson   On: 11/14/2012 09:12    Microbiology: Recent Results (from the past 240 hour(s))  MRSA PCR SCREENING     Status: None   Collection Time    12/04/12  5:17 AM      Result Value Range Status   MRSA by PCR NEGATIVE  NEGATIVE Final   Comment:            The GeneXpert MRSA Assay (FDA     approved for NASAL specimens     only), is one component of a     comprehensive MRSA colonization     surveillance program. It is not     intended to diagnose MRSA     infection nor to guide or     monitor treatment for     MRSA infections.  CULTURE, BLOOD (ROUTINE X 2)     Status: None   Collection Time    12/04/12  9:00 AM      Result Value Range Status   Specimen Description BLOOD LEFT ANTECUBITAL   Final   Special Requests BOTTLES DRAWN AEROBIC AND ANAEROBIC 10CC   Final   Culture  Setup Time     Final   Value: 12/04/2012 16:53     Performed at [facility]   Culture     Final   Value: NO GROWTH 5 DAYS      Performed at [facility]   Report Status 12/10/2012 FINAL   Final  CULTURE, BLOOD (ROUTINE X 2)     Status: None   Collection Time    12/04/12  9:25 AM      Result Value Range Status   Specimen Description BLOOD HAND RIGHT   Final   Special Requests BOTTLES DRAWN AEROBIC ONLY 5CC   Final  Culture  Setup Time     Final   Value: 12/04/2012 16:53     Performed at [facility]   Culture     Final   Value: NO GROWTH 5 DAYS     Performed at [facility]   Report Status 12/10/2012 FINAL   Final  URINE CULTURE     Status: None   Collection Time    12/04/12 10:45 PM      Result Value Range Status   Specimen Description URINE, CATHETERIZED   Final   Special Requests NONE   Final   Culture  Setup Time     Final   Value: 12/05/2012 05:05     Performed at [facility] Count     Final   Value: NO GROWTH     Performed at [facility]   Culture     Final   Value: NO GROWTH     Performed at [facility]   Report Status 12/06/2012 FINAL   Final     Labs: Basic Metabolic Panel:  Recent Labs Lab 12/05/12 0842 12/05/12 1618 12/06/12 0700 12/07/12 0550 12/08/12 0655  NA 154* 152* 151* 148* 142  K 4.3 3.6 3.3* 3.0* 4.0  CL 115* 114* 114* 110 107  CO2 30 29 30  32 31  GLUCOSE 84 113* 123* 118* 112*  BUN 51* 49* 39* 34* 34*  CREATININE 0.95 0.95 0.79 0.81 0.86  CALCIUM 8.4 8.4 8.4 8.2* 8.1*   Liver Function Tests:  Recent Labs Lab 12/05/12 0500  AST 61*  ALT 49*  ALKPHOS 452*  BILITOT 1.2  PROT 7.4  ALBUMIN 2.0*   No results found for this basename: LIPASE, AMYLASE,  in the last 168 hours No results found for this basename: AMMONIA,  in the last 168 hours CBC:  Recent Labs Lab 12/05/12 0842 12/06/12 0700 12/07/12 0550 12/10/12 0615 12/11/12 0525  WBC 12.4* 8.8 6.6 9.1  --   HGB 10.4* 10.8* 9.6* 8.4* 8.4*  HCT 32.1* 34.6* 30.6* 26.4* 26.3*  MCV 98.5 98.9 98.7 97.4  --   PLT 140* 97* 94* 97*  --    Cardiac  Enzymes: No results found for this basename: CKTOTAL, CKMB, CKMBINDEX, TROPONINI,  in the last 168 hours BNP: BNP (last 3 results)  Recent Labs  11/14/12 0928 11/26/12 1647 12/04/12 0009  PROBNP 3007.0* 254.0* 5031.0*   CBG: No results found for this basename: GLUCAP,  in the last 168 hours     Signed:  Shalom Mcguiness  [facility] 12/11/2012, 9:45 AM

## 2012-12-12 ENCOUNTER — Ambulatory Visit (INDEPENDENT_AMBULATORY_CARE_PROVIDER_SITE_OTHER): Payer: Medicare Other | Admitting: Cardiology

## 2012-12-12 DIAGNOSIS — Z7901 Long term (current) use of anticoagulants: Secondary | ICD-10-CM

## 2012-12-12 LAB — POCT INR: INR: 1.7

## 2012-12-15 ENCOUNTER — Other Ambulatory Visit: Payer: Self-pay | Admitting: *Deleted

## 2012-12-15 DIAGNOSIS — I4891 Unspecified atrial fibrillation: Secondary | ICD-10-CM

## 2012-12-17 ENCOUNTER — Ambulatory Visit (INDEPENDENT_AMBULATORY_CARE_PROVIDER_SITE_OTHER): Payer: Medicare Other | Admitting: Pharmacist

## 2012-12-17 ENCOUNTER — Telehealth: Payer: Self-pay | Admitting: *Deleted

## 2012-12-17 DIAGNOSIS — Z7901 Long term (current) use of anticoagulants: Secondary | ICD-10-CM

## 2012-12-17 LAB — POCT INR: INR: 1.1

## 2012-12-17 NOTE — Telephone Encounter (Signed)
Faxed wheelchair order  12/16/12 - Faxed INR verbal order for 9/19;10/02;10/22. Did not sign order for 10/9 as patient was in the hospital.

## 2012-12-22 ENCOUNTER — Encounter (HOSPITAL_COMMUNITY): Payer: Self-pay | Admitting: Emergency Medicine

## 2012-12-22 ENCOUNTER — Emergency Department (HOSPITAL_COMMUNITY): Payer: Medicare Other

## 2012-12-22 ENCOUNTER — Emergency Department (HOSPITAL_COMMUNITY)
Admission: EM | Admit: 2012-12-22 | Discharge: 2012-12-23 | Disposition: A | Discharge status: Home / Self Care | Payer: Medicare Other | Attending: Emergency Medicine | Admitting: Emergency Medicine

## 2012-12-22 ENCOUNTER — Encounter: Payer: Self-pay | Admitting: Internal Medicine

## 2012-12-22 DIAGNOSIS — D649 Anemia, unspecified: Secondary | ICD-10-CM

## 2012-12-22 DIAGNOSIS — J449 Chronic obstructive pulmonary disease, unspecified: Secondary | ICD-10-CM | POA: Insufficient documentation

## 2012-12-22 DIAGNOSIS — R630 Anorexia: Secondary | ICD-10-CM | POA: Insufficient documentation

## 2012-12-22 DIAGNOSIS — I5042 Chronic combined systolic (congestive) and diastolic (congestive) heart failure: Secondary | ICD-10-CM | POA: Insufficient documentation

## 2012-12-22 DIAGNOSIS — Z882 Allergy status to sulfonamides status: Secondary | ICD-10-CM | POA: Insufficient documentation

## 2012-12-22 DIAGNOSIS — Z8673 Personal history of transient ischemic attack (TIA), and cerebral infarction without residual deficits: Secondary | ICD-10-CM | POA: Insufficient documentation

## 2012-12-22 DIAGNOSIS — I079 Rheumatic tricuspid valve disease, unspecified: Secondary | ICD-10-CM | POA: Insufficient documentation

## 2012-12-22 DIAGNOSIS — R5381 Other malaise: Secondary | ICD-10-CM | POA: Insufficient documentation

## 2012-12-22 DIAGNOSIS — R0602 Shortness of breath: Secondary | ICD-10-CM | POA: Insufficient documentation

## 2012-12-22 DIAGNOSIS — I4891 Unspecified atrial fibrillation: Secondary | ICD-10-CM | POA: Insufficient documentation

## 2012-12-22 DIAGNOSIS — Z8701 Personal history of pneumonia (recurrent): Secondary | ICD-10-CM | POA: Insufficient documentation

## 2012-12-22 DIAGNOSIS — I059 Rheumatic mitral valve disease, unspecified: Secondary | ICD-10-CM | POA: Insufficient documentation

## 2012-12-22 DIAGNOSIS — Z7901 Long term (current) use of anticoagulants: Secondary | ICD-10-CM | POA: Insufficient documentation

## 2012-12-22 DIAGNOSIS — J90 Pleural effusion, not elsewhere classified: Secondary | ICD-10-CM | POA: Insufficient documentation

## 2012-12-22 DIAGNOSIS — R1310 Dysphagia, unspecified: Secondary | ICD-10-CM | POA: Insufficient documentation

## 2012-12-22 DIAGNOSIS — I421 Obstructive hypertrophic cardiomyopathy: Secondary | ICD-10-CM | POA: Insufficient documentation

## 2012-12-22 DIAGNOSIS — I10 Essential (primary) hypertension: Secondary | ICD-10-CM | POA: Insufficient documentation

## 2012-12-22 DIAGNOSIS — J4489 Other specified chronic obstructive pulmonary disease: Secondary | ICD-10-CM | POA: Insufficient documentation

## 2012-12-22 DIAGNOSIS — I447 Left bundle-branch block, unspecified: Secondary | ICD-10-CM | POA: Insufficient documentation

## 2012-12-22 DIAGNOSIS — Z8679 Personal history of other diseases of the circulatory system: Secondary | ICD-10-CM | POA: Insufficient documentation

## 2012-12-22 DIAGNOSIS — Z79899 Other long term (current) drug therapy: Secondary | ICD-10-CM | POA: Insufficient documentation

## 2012-12-22 DIAGNOSIS — Z885 Allergy status to narcotic agent status: Secondary | ICD-10-CM | POA: Insufficient documentation

## 2012-12-22 DIAGNOSIS — J9819 Other pulmonary collapse: Secondary | ICD-10-CM | POA: Insufficient documentation

## 2012-12-22 DIAGNOSIS — I252 Old myocardial infarction: Secondary | ICD-10-CM | POA: Insufficient documentation

## 2012-12-22 LAB — CBC WITH DIFFERENTIAL/PLATELET
Basophils Absolute: 0 10*3/uL (ref 0.0–0.1)
Basophils Relative: 0 % (ref 0–1)
Eosinophils Absolute: 0.1 10*3/uL (ref 0.0–0.7)
Eosinophils Relative: 1 % (ref 0–5)
HCT: 29.4 % — ABNORMAL LOW (ref 36.0–46.0)
Hemoglobin: 8.9 g/dL — ABNORMAL LOW (ref 12.0–15.0)
Lymphocytes Relative: 26 % (ref 12–46)
Lymphs Abs: 1.9 10*3/uL (ref 0.7–4.0)
MCH: 31.6 pg (ref 26.0–34.0)
MCHC: 30.3 g/dL (ref 30.0–36.0)
MCV: 104.3 fL — ABNORMAL HIGH (ref 78.0–100.0)
Monocytes Absolute: 0.7 10*3/uL (ref 0.1–1.0)
Monocytes Relative: 9 % (ref 3–12)
Neutro Abs: 4.6 10*3/uL (ref 1.7–7.7)
Neutrophils Relative %: 64 % (ref 43–77)
Platelets: 89 10*3/uL — ABNORMAL LOW (ref 150–400)
RBC: 2.82 MIL/uL — ABNORMAL LOW (ref 3.87–5.11)
RDW: 19.7 % — ABNORMAL HIGH (ref 11.5–15.5)
WBC: 7.2 10*3/uL (ref 4.0–10.5)

## 2012-12-22 LAB — BASIC METABOLIC PANEL
BUN: 22 mg/dL (ref 6–23)
CO2: 36 mEq/L — ABNORMAL HIGH (ref 19–32)
Calcium: 8.6 mg/dL (ref 8.4–10.5)
Chloride: 106 mEq/L (ref 96–112)
Creatinine, Ser: 0.57 mg/dL (ref 0.50–1.10)
GFR calc Af Amer: >90 mL/min (ref >90)
GFR calc non Af Amer: 78 mL/min — ABNORMAL LOW (ref >90)
Glucose, Bld: 136 mg/dL — ABNORMAL HIGH (ref 70–99)
Potassium: 4 mEq/L (ref 3.5–5.1)
Sodium: 143 mEq/L (ref 135–145)

## 2012-12-22 LAB — PROTIME-INR
INR: 2.64 — ABNORMAL HIGH (ref 0.00–1.49)
Prothrombin Time: 27.3 seconds — ABNORMAL HIGH (ref 11.6–15.2)

## 2012-12-22 LAB — TYPE AND SCREEN
ABO/RH(D): O POS
Antibody Screen: NEGATIVE

## 2012-12-22 MED ORDER — AMOXICILLIN-POT CLAVULANATE 500-125 MG PO TABS: 1.0000 | ORAL_TABLET | Freq: Two times a day (BID) | ORAL | Status: DC

## 2012-12-22 NOTE — ED Notes (Addendum)
Patient was seen here for pneumonia and discharged home into the care of home health staff. Patient Hemoglobin at discharge was 8.0 per family.  Patient has been having regular blood tests completed at home and was found to have a Hemoglobin at 7.5. Personal MD requested patient be transported to hospital for blood transfusion.

## 2012-12-22 NOTE — ED Provider Notes (Signed)
CSN: [id number]     Arrival date & time 12/22/12  2046 History   First MD Initiated Contact with Patient 12/22/12 2100     Chief Complaint  Patient presents with  . 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 Cause of anemia is unknown.  Pt was admitted to ED from 12/03/12 to 12/11/12 for sepsis, feeding difficulty, and aspiration pneumonia.   Pt has a daily home health nurse as well as physicians that come into her home for daily blood draws.  Pt also reported to have been dx today with bilateral pneumonia.  Pt was taking cefepime in ED and discharged home with augmentin but home physician saw the pneumonia appears to have returned and requested pt be started on Clindamycin 900mg  IV Q8H.  Daughter refused, stating she believed this dose was too high for pt's age.  Along with reports of worsening anemia and possible recurrent pneumonia, pt reportedly more fatigued and decreased eating.  Denies fever, n/v/d. Denies hematochezia, hematuria, or hematemesis.  Past Medical History  Diagnosis Date  . Stroke     a. L MCA CVA 2009.  Marland Kitchen Hypertension   . COPD (chronic obstructive pulmonary disease)   . Paroxysmal atrial fibrillation   . NSTEMI (non-ST elevated myocardial infarction)     a. 10/2012 - managed medically.  . Chronic combined systolic and diastolic CHF (congestive heart failure)   . Symptomatic bradycardia     a. s/p St. Jude pacemaker 2009.  Marland Kitchen PNA (pneumonia)     a. Per notes, h/o pulm injury r/t PNA.  aspiration PNA 11/2012  . LBBB (left bundle branch block)   . HOCM (hypertrophic obstructive cardiomyopathy)     a. 2D Echo 10/2012: EF 40-45%, mild LVH, mild-mod TR, mitral SAM with mod MR, LVOT gradient not well assessed but at least 2.43m/s;  findings c/w HOCM.  Marland Kitchen Mitral regurgitation     a. Mod MR by echo 10/2012.  . Tricuspid regurgitation     a. Mild-mod TR by echo 10/2012.  Marland Kitchen Dysphagia    Past Surgical History  Procedure Laterality Date  . Pacemaker insertion  11/19/2007    St. Jude Zephyr 5020 pulse generator, serial [id number]  . Femur surgery Right 06/2011    Rod inserted after a fall.    Family History  Problem Relation Age of Onset  . Heart disease Mother    History  Substance Use Topics  . Smoking status: Never Smoker   . Smokeless tobacco: Not on file  . Alcohol Use: No   OB History   Grav Para Term Preterm Abortions TAB SAB Ect Mult Living                 Review of Systems  Constitutional: Positive for appetite change and fatigue. Negative for fever and chills.  Respiratory: Negative for cough and shortness of breath.   Cardiovascular: Negative for leg swelling.  Gastrointestinal: Negative for nausea, vomiting and blood in stool.  All other systems reviewed and are negative.    Allergies  Codeine and Sulfonamide derivatives  Home Medications   Current Outpatient Rx  Name  Route  Sig  Dispense  Refill  . amiodarone (PACERONE) 100 MG tablet   Oral   Take 1 tablet (100 mg total) by mouth daily.   30 tablet  0   . cholecalciferol (VITAMIN D) 1000 UNITS tablet   Oral   Take 2,000 Units by mouth daily.         . Cyanocobalamin (VITAMIN B-12 PO)   Oral   Take 1 tablet by mouth daily.         . furosemide (LASIX) 20 MG tablet   Oral   Take 20 mg by mouth daily as needed for fluid.         Marland Kitchen levalbuterol (XOPENEX) 0.63 MG/3ML nebulizer solution   Nebulization   Take 3 mLs (0.63 mg total) by nebulization every 6 (six) hours as needed for wheezing.   3 mL   30     Please give 1 month supply   . nitroGLYCERIN (NITROSTAT) 0.4 MG SL tablet   Sublingual   Place 1 tablet (0.4 mg total) under the tongue every 5 (five) minutes x 3 doses as needed for chest pain.   25 tablet   12   .  Probiotic Product (PROBIOTIC PO)   Oral   Take 1 tablet by mouth daily.         Marland Kitchen tobramycin-dexamethasone (TOBRADEX) ophthalmic solution   Both Eyes   Place 1 drop into both eyes every 6 (six) hours.   5 mL   0   . vitamin C (ASCORBIC ACID) 500 MG tablet   Oral   Take 500 mg by mouth daily.         Marland Kitchen warfarin (COUMADIN) 1 MG tablet   Oral   Take 0.5-1 mg by mouth daily. Take 1/2 tablet on Thursday and Sundays then take 1 tablet the other days         . amoxicillin-clavulanate (AUGMENTIN) 500-125 MG per tablet   Oral   Take 1 tablet (500 mg total) by mouth 2 (two) times daily.   20 tablet   0    BP 166/65  Pulse 66  Temp(Src) 98 F (36.7 C) (Oral)  Resp 20  SpO2 100% Physical Exam  Nursing note and vitals reviewed. Constitutional: She appears well-developed and well-nourished. No distress.  Pt sitting up in exam bed with neck pillow on, head slumped over with chin to chest. Occasionally looks around room.  HENT:  Head: Normocephalic and atraumatic.  Eyes: Conjunctivae are normal. No scleral icterus.  Neck: Normal range of motion.  Cardiovascular: Normal rate, regular rhythm and normal heart sounds.   Pulmonary/Chest: Effort normal and breath sounds normal. No respiratory distress. She has no wheezes. She has no rales. She exhibits no tenderness.  Abdominal: Soft. Bowel sounds are normal. She exhibits no distension and no mass. There is no tenderness. There is no rebound and no guarding.  Musculoskeletal: Normal range of motion.  Neurological: She is alert.  Skin: Skin is warm and dry. She is not diaphoretic.    ED Course  Procedures (including critical care time) Labs Review Labs Reviewed  BASIC METABOLIC PANEL - Abnormal; Notable for the following:    CO2 36 (*)    Glucose, Bld 136 (*)    GFR calc non Af Amer 78 (*)    All other components within normal limits  CBC WITH DIFFERENTIAL - Abnormal; Notable for the following:    RBC 2.82 (*)    Hemoglobin  8.9 (*)    HCT 29.4 (*)    MCV 104.3 (*)    RDW 19.7 (*)    Platelets 89 (*)    All other components within normal limits  PROTIME-INR - Abnormal;  Notable for the following:    Prothrombin Time 27.3 (*)    INR 2.64 (*)    All other components within normal limits  OCCULT BLOOD X 1 CARD TO LAB, STOOL  TYPE AND SCREEN   Imaging Review Dg Chest Portable 1 View  12/22/2012   CLINICAL DATA:  Recent pneumonia, abnormal labs, history stroke, COPD, hypertension, MI  EXAM: PORTABLE CHEST - 1 VIEW  COMPARISON:  Portable exam 2120 hr compared to 12/03/2012  FINDINGS: Left subclavian transvenous pacemaker leads project over right atrium and right ventricle.  Enlargement of cardiac silhouette.  Atherosclerotic calcification aorta.  Bibasilar effusions and atelectasis, cannot exclude consolidation at lung bases.  Underlying COPD.  No pneumothorax.  Bones demineralized with multiple old right rib fractures.  IMPRESSION: Enlargement of cardiac silhouette post pacemaker.  Changes of COPD with bibasilar pleural effusions and atelectasis, unable to exclude underlying consolidation at lung bases.   Electronically Signed   By: Ulyses Southward M.D.   On: 12/22/2012 21:33    EKG Interpretation   None       MDM   1. Anemia   2. SOB (shortness of breath)    Will work up pt for anemia and admit for blood transfusion and possible recurrent pneumonia.  Pt is stable in ED. Vitals: unremarkable.   All labs/imaging/findings discussed with patient. All questions answered and concerns addressed.  Discussed admitting pt to hospital vs discharge home with continued home health care.  Daughter stated she would prefer pt be discharged home with continued healthcare management.  Will tx possible pneumonia. Rx: Augmentin.   Will discharge pt home and have pt f/u with her PCP. Return precautions given. Pt's daughter verbalized understanding and agreement with tx plan. Vitals: unremarkable. Discharged in stable condition.     Discussed pt with attending during ED encounter and agrees with plan.   Junius Finner, PA-C 12/22/12 2349

## 2012-12-23 NOTE — ED Provider Notes (Signed)
Face-to-face evaluation:  I spoke with the family and examined the patient. Patient is in no distress. She is diminished bibasilar breath sounds. Her x-ray looks similar to her discharge x-ray from 1016. Her labs are not appreciably changed with regard to her hemoglobin is actually improved at 8.9. Her electrolytes are improved with sodium of 143. The family does not wish her to be admitted.  I do not find evidence of acute condition to require admission. We'll continue to complete of antibiotics for recent possible aspiration pneumonia. On her 2 L of home oxygen she is oxygenating 99%  Roney Marion, MD 12/23/12 [phone]

## 2012-12-25 ENCOUNTER — Ambulatory Visit (INDEPENDENT_AMBULATORY_CARE_PROVIDER_SITE_OTHER): Payer: Medicare Other | Admitting: Cardiology

## 2012-12-25 DIAGNOSIS — Z7901 Long term (current) use of anticoagulants: Secondary | ICD-10-CM

## 2012-12-25 LAB — POCT INR: INR: 2.9

## 2012-12-27 NOTE — ED Provider Notes (Signed)
Medical screening examination/treatment/procedure(s) were performed by non-physician practitioner and as supervising physician I was immediately available for consultation/collaboration.  EKG Interpretation   None         Roney Marion, MD 12/27/12 1101

## 2012-12-29 ENCOUNTER — Ambulatory Visit (INDEPENDENT_AMBULATORY_CARE_PROVIDER_SITE_OTHER): Payer: Medicare Other | Admitting: Cardiology

## 2012-12-29 DIAGNOSIS — Z7901 Long term (current) use of anticoagulants: Secondary | ICD-10-CM

## 2012-12-29 LAB — PROTIME-INR: INR: 1.8 — AB (ref <=1.1)

## 2013-01-06 ENCOUNTER — Ambulatory Visit (INDEPENDENT_AMBULATORY_CARE_PROVIDER_SITE_OTHER): Payer: Medicare Other | Admitting: Pharmacist

## 2013-01-06 DIAGNOSIS — Z7901 Long term (current) use of anticoagulants: Secondary | ICD-10-CM

## 2013-01-06 LAB — POCT INR: INR: 1.3

## 2013-01-09 ENCOUNTER — Encounter: Payer: Self-pay | Admitting: Internal Medicine

## 2013-01-11 ENCOUNTER — Observation Stay (HOSPITAL_COMMUNITY): Payer: Medicare Other

## 2013-01-11 ENCOUNTER — Observation Stay (HOSPITAL_COMMUNITY)
Admission: EM | Admit: 2013-01-11 | Discharge: 2013-01-12 | Disposition: A | Discharge status: Home / Self Care | Payer: Medicare Other | Attending: Internal Medicine | Admitting: Internal Medicine

## 2013-01-11 ENCOUNTER — Emergency Department (HOSPITAL_COMMUNITY): Payer: Medicare Other

## 2013-01-11 ENCOUNTER — Encounter (HOSPITAL_COMMUNITY): Payer: Self-pay | Admitting: Emergency Medicine

## 2013-01-11 DIAGNOSIS — J4489 Other specified chronic obstructive pulmonary disease: Secondary | ICD-10-CM | POA: Insufficient documentation

## 2013-01-11 DIAGNOSIS — L89152 Pressure ulcer of sacral region, stage 2: Secondary | ICD-10-CM | POA: Diagnosis present

## 2013-01-11 DIAGNOSIS — J449 Chronic obstructive pulmonary disease, unspecified: Secondary | ICD-10-CM | POA: Insufficient documentation

## 2013-01-11 DIAGNOSIS — I509 Heart failure, unspecified: Secondary | ICD-10-CM | POA: Insufficient documentation

## 2013-01-11 DIAGNOSIS — Z79899 Other long term (current) drug therapy: Secondary | ICD-10-CM | POA: Insufficient documentation

## 2013-01-11 DIAGNOSIS — I672 Cerebral atherosclerosis: Secondary | ICD-10-CM | POA: Insufficient documentation

## 2013-01-11 DIAGNOSIS — I447 Left bundle-branch block, unspecified: Secondary | ICD-10-CM | POA: Insufficient documentation

## 2013-01-11 DIAGNOSIS — I252 Old myocardial infarction: Secondary | ICD-10-CM | POA: Insufficient documentation

## 2013-01-11 DIAGNOSIS — I5040 Unspecified combined systolic (congestive) and diastolic (congestive) heart failure: Secondary | ICD-10-CM

## 2013-01-11 DIAGNOSIS — I421 Obstructive hypertrophic cardiomyopathy: Secondary | ICD-10-CM | POA: Insufficient documentation

## 2013-01-11 DIAGNOSIS — I079 Rheumatic tricuspid valve disease, unspecified: Secondary | ICD-10-CM | POA: Insufficient documentation

## 2013-01-11 DIAGNOSIS — R1310 Dysphagia, unspecified: Principal | ICD-10-CM | POA: Diagnosis present

## 2013-01-11 DIAGNOSIS — I10 Essential (primary) hypertension: Secondary | ICD-10-CM

## 2013-01-11 DIAGNOSIS — Z7901 Long term (current) use of anticoagulants: Secondary | ICD-10-CM

## 2013-01-11 DIAGNOSIS — I319 Disease of pericardium, unspecified: Secondary | ICD-10-CM | POA: Insufficient documentation

## 2013-01-11 DIAGNOSIS — I5042 Chronic combined systolic (congestive) and diastolic (congestive) heart failure: Secondary | ICD-10-CM | POA: Insufficient documentation

## 2013-01-11 DIAGNOSIS — J690 Pneumonitis due to inhalation of food and vomit: Secondary | ICD-10-CM | POA: Insufficient documentation

## 2013-01-11 DIAGNOSIS — E46 Unspecified protein-calorie malnutrition: Secondary | ICD-10-CM | POA: Insufficient documentation

## 2013-01-11 DIAGNOSIS — I4891 Unspecified atrial fibrillation: Secondary | ICD-10-CM

## 2013-01-11 DIAGNOSIS — I059 Rheumatic mitral valve disease, unspecified: Secondary | ICD-10-CM | POA: Insufficient documentation

## 2013-01-11 DIAGNOSIS — E162 Hypoglycemia, unspecified: Secondary | ICD-10-CM | POA: Diagnosis present

## 2013-01-11 DIAGNOSIS — Z882 Allergy status to sulfonamides status: Secondary | ICD-10-CM | POA: Insufficient documentation

## 2013-01-11 DIAGNOSIS — B3781 Candidal esophagitis: Secondary | ICD-10-CM | POA: Diagnosis present

## 2013-01-11 DIAGNOSIS — G9389 Other specified disorders of brain: Secondary | ICD-10-CM | POA: Insufficient documentation

## 2013-01-11 DIAGNOSIS — Z950 Presence of cardiac pacemaker: Secondary | ICD-10-CM | POA: Insufficient documentation

## 2013-01-11 DIAGNOSIS — Z8673 Personal history of transient ischemic attack (TIA), and cerebral infarction without residual deficits: Secondary | ICD-10-CM | POA: Insufficient documentation

## 2013-01-11 DIAGNOSIS — Z885 Allergy status to narcotic agent status: Secondary | ICD-10-CM | POA: Insufficient documentation

## 2013-01-11 DIAGNOSIS — J90 Pleural effusion, not elsewhere classified: Secondary | ICD-10-CM | POA: Insufficient documentation

## 2013-01-11 DIAGNOSIS — E43 Unspecified severe protein-calorie malnutrition: Secondary | ICD-10-CM | POA: Diagnosis present

## 2013-01-11 LAB — BASIC METABOLIC PANEL
CO2: 35 mEq/L — ABNORMAL HIGH (ref 19–32)
Chloride: 100 mEq/L (ref 96–112)
Creatinine, Ser: 0.56 mg/dL (ref 0.50–1.10)
Glucose, Bld: 79 mg/dL (ref 70–99)
Potassium: 4.1 mEq/L (ref 3.5–5.1)
Sodium: 139 mEq/L (ref 135–145)

## 2013-01-11 LAB — CBC WITH DIFFERENTIAL/PLATELET
Basophils Absolute: 0 10*3/uL (ref 0.0–0.1)
Basophils Relative: 0 % (ref 0–1)
HCT: 30.4 % — ABNORMAL LOW (ref 36.0–46.0)
Lymphocytes Relative: 32 % (ref 12–46)
Lymphs Abs: 2.1 10*3/uL (ref 0.7–4.0)
MCV: 101.7 fL — ABNORMAL HIGH (ref 78.0–100.0)
Monocytes Absolute: 0.5 10*3/uL (ref 0.1–1.0)
Neutro Abs: 3.9 10*3/uL (ref 1.7–7.7)
Neutrophils Relative %: 60 % (ref 43–77)
Platelets: 108 10*3/uL — ABNORMAL LOW (ref 150–400)
RBC: 2.99 MIL/uL — ABNORMAL LOW (ref 3.87–5.11)
RDW: 17.3 % — ABNORMAL HIGH (ref 11.5–15.5)
WBC: 6.5 10*3/uL (ref 4.0–10.5)

## 2013-01-11 LAB — PROTIME-INR: INR: 1.53 — ABNORMAL HIGH (ref 0.00–1.49)

## 2013-01-11 LAB — GLUCOSE, CAPILLARY
Glucose-Capillary: 65 mg/dL — ABNORMAL LOW (ref 70–99)
Glucose-Capillary: 68 mg/dL — ABNORMAL LOW (ref 70–99)

## 2013-01-11 LAB — PHOSPHORUS: Phosphorus: 2.9 mg/dL (ref 2.3–4.6)

## 2013-01-11 MED ORDER — DEXTROSE-NACL 5-0.45 % IV SOLN: INTRAVENOUS | Status: DC

## 2013-01-11 MED ORDER — DEXTROSE-NACL 5-0.45 % IV SOLN
INTRAVENOUS | Status: DC
  Administered 2013-01-11: 17:00:00 via INTRAVENOUS

## 2013-01-11 MED ORDER — SODIUM CHLORIDE 0.9 % IV SOLN
INTRAVENOUS | Status: DC
  Administered 2013-01-11: 14:00:00 via INTRAVENOUS

## 2013-01-11 MED ORDER — LEVALBUTEROL HCL 0.63 MG/3ML IN NEBU
0.6300 mg | INHALATION_SOLUTION | Freq: Four times a day (QID) | RESPIRATORY_TRACT | Status: DC
  Administered 2013-01-12 (×3): 0.63 mg via RESPIRATORY_TRACT
  Filled 2013-01-11 (×5): qty 3

## 2013-01-11 MED ORDER — LEVALBUTEROL HCL 0.63 MG/3ML IN NEBU
0.6300 mg | INHALATION_SOLUTION | Freq: Four times a day (QID) | RESPIRATORY_TRACT | Status: DC | PRN
  Administered 2013-01-11: 0.63 mg via RESPIRATORY_TRACT
  Filled 2013-01-11: qty 3

## 2013-01-11 MED ORDER — SODIUM CHLORIDE 0.9 % IJ SOLN
10.0000 mL | INTRAMUSCULAR | Status: DC | PRN
  Administered 2013-01-12 (×2): 10 mL

## 2013-01-11 MED ORDER — SODIUM CHLORIDE 0.9 % IV SOLN: 250.0000 mL | INTRAVENOUS | Status: DC | PRN

## 2013-01-11 MED ORDER — DEXTROSE-NACL 5-0.45 % IV SOLN: INTRAVENOUS | Status: AC

## 2013-01-11 MED ORDER — NITROGLYCERIN 0.4 MG SL SUBL: 0.4000 mg | SUBLINGUAL_TABLET | SUBLINGUAL | Status: DC | PRN

## 2013-01-11 MED ORDER — SODIUM CHLORIDE 0.9 % IV BOLUS (SEPSIS)
250.0000 mL | Freq: Once | INTRAVENOUS | Status: AC
  Administered 2013-01-11: 250 mL via INTRAVENOUS

## 2013-01-11 MED ORDER — SODIUM CHLORIDE 0.9 % IV SOLN: INTRAVENOUS | Status: DC

## 2013-01-11 MED ORDER — ENOXAPARIN SODIUM 40 MG/0.4ML ~~LOC~~ SOLN
40.0000 mg | SUBCUTANEOUS | Status: DC
  Administered 2013-01-11: 40 mg via SUBCUTANEOUS
  Filled 2013-01-11 (×2): qty 0.4

## 2013-01-11 MED ORDER — SODIUM CHLORIDE 0.9 % IJ SOLN: 3.0000 mL | Freq: Two times a day (BID) | INTRAMUSCULAR | Status: DC

## 2013-01-11 MED ORDER — SODIUM CHLORIDE 0.9 % IJ SOLN: 3.0000 mL | INTRAMUSCULAR | Status: DC | PRN

## 2013-01-11 MED ORDER — DEXTROSE 50 % IV SOLN
25.0000 mL | Freq: Once | INTRAVENOUS | Status: AC | PRN
  Administered 2013-01-11: 25 mL via INTRAVENOUS

## 2013-01-11 MED ORDER — DEXTROSE 50 % IV SOLN
INTRAVENOUS | Status: AC
  Administered 2013-01-11: 25 mL via INTRAVENOUS
  Filled 2013-01-11: qty 50

## 2013-01-11 NOTE — ED Provider Notes (Signed)
CSN: [id number]     Arrival date & time 01/11/13  1226 History   First MD Initiated Contact with Patient 01/11/13 1234     Chief Complaint  Patient presents with  . Dysphagia   (Consider location/radiation/quality/duration/timing/severity/associated sxs/prior Treatment) HPI Comments: Patients with a history of dysphagia from CVA, aspiration pneumonia -- presents with complaint of worsening swallowing for the past 36 hours. Patient has been unable to keep any solids or liquids down. She is typically on a pured diet. Family describes regurgitation after 30-60 seconds. No forceful vomiting. Patient's home health nurse started D5 at 15 cc per hour yesterday to prevent dehydration (history of CHF). Family notes that patient's voice is abnormal, like she is talking through water. No reported chest pain or shortness of breath. Patient has a history of aphasia and is unable to give much history due to current condition -- her family gives the history. The onset of this condition was acute. The course is constant. Aggravating factors: none. Alleviating factors: none.    The history is provided by the patient, medical records and a relative.    Past Medical History  Diagnosis Date  . Stroke     a. L MCA CVA 2009.  Marland Kitchen Hypertension   . COPD (chronic obstructive pulmonary disease)   . Paroxysmal atrial fibrillation   . NSTEMI (non-ST elevated myocardial infarction)     a. 10/2012 - managed medically.  . Chronic combined systolic and diastolic CHF (congestive heart failure)   . Symptomatic bradycardia     a. s/p St. Jude pacemaker 2009.  Marland Kitchen PNA (pneumonia)     a. Per notes, h/o pulm injury r/t PNA.  aspiration PNA 11/2012  . LBBB (left bundle branch block)   . HOCM (hypertrophic obstructive cardiomyopathy)     a. 2D Echo 10/2012: EF 40-45%, mild LVH, mild-mod TR, mitral SAM with mod MR, LVOT gradient not well assessed but at least 2.22m/s; findings c/w HOCM.  Marland Kitchen Mitral regurgitation     a. Mod MR by  echo 10/2012.  . Tricuspid regurgitation     a. Mild-mod TR by echo 10/2012.  Marland Kitchen Dysphagia    Past Surgical History  Procedure Laterality Date  . Pacemaker insertion  11/19/2007    St. Jude Zephyr 5020 pulse generator, serial [id number]  . Femur surgery Right 06/2011    Rod inserted after a fall.    Family History  Problem Relation Age of Onset  . Heart disease Mother    History  Substance Use Topics  . Smoking status: Never Smoker   . Smokeless tobacco: Not on file  . Alcohol Use: No   OB History   Grav Para Term Preterm Abortions TAB SAB Ect Mult Living                 Review of Systems  Constitutional: Negative for fever.  HENT: Positive for trouble swallowing. Negative for rhinorrhea and sore throat.   Eyes: Negative for redness.  Respiratory: Negative for cough.   Cardiovascular: Negative for chest pain.  Gastrointestinal: Negative for nausea, vomiting, abdominal pain and diarrhea.  Genitourinary: Negative for dysuria.  Musculoskeletal: Negative for myalgias.  Skin: Negative for rash.  Neurological: Negative for headaches.    Allergies  Codeine and Sulfonamide derivatives  Home Medications   Current Outpatient Rx  Name  Route  Sig  Dispense  Refill  . amiodarone (PACERONE) 100 MG tablet   Oral   Take 1 tablet (100 mg total) by mouth daily.  30 tablet   0   . amoxicillin-clavulanate (AUGMENTIN) 500-125 MG per tablet   Oral   Take 1 tablet (500 mg total) by mouth 2 (two) times daily.   20 tablet   0   . cholecalciferol (VITAMIN D) 1000 UNITS tablet   Oral   Take 2,000 Units by mouth daily.         . Cyanocobalamin (VITAMIN B-12 PO)   Oral   Take 1 tablet by mouth daily.         . furosemide (LASIX) 20 MG tablet   Oral   Take 20 mg by mouth daily as needed for fluid.         Marland Kitchen levalbuterol (XOPENEX) 0.63 MG/3ML nebulizer solution   Nebulization   Take 3 mLs (0.63 mg total) by nebulization every 6 (six) hours as needed for wheezing.   3 mL    30     Please give 1 month supply   . nitroGLYCERIN (NITROSTAT) 0.4 MG SL tablet   Sublingual   Place 1 tablet (0.4 mg total) under the tongue every 5 (five) minutes x 3 doses as needed for chest pain.   25 tablet   12   . Probiotic Product (PROBIOTIC PO)   Oral   Take 1 tablet by mouth daily.         Marland Kitchen tobramycin-dexamethasone (TOBRADEX) ophthalmic solution   Both Eyes   Place 1 drop into both eyes every 6 (six) hours.   5 mL   0   . vitamin C (ASCORBIC ACID) 500 MG tablet   Oral   Take 500 mg by mouth daily.         Marland Kitchen warfarin (COUMADIN) 1 MG tablet   Oral   Take 0.5-1 mg by mouth daily. Take 1/2 tablet on Thursday and Sundays then take 1 tablet the other days          BP 120/38  Pulse 70  Temp(Src) 97.9 F (36.6 C) (Oral)  Ht 5\' 5"  (1.651 m)  Wt 128 lb (58.06 kg)  BMI 21.30 kg/m2  SpO2 100% Physical Exam  Nursing note and vitals reviewed. Constitutional: She appears well-developed and well-nourished.  HENT:  Head: Normocephalic and atraumatic.  Mouth/Throat: Oropharynx is clear and moist.  Eyes: Conjunctivae are normal. Right eye exhibits no discharge. Left eye exhibits no discharge.  Neck: Normal range of motion. Neck supple.  Cardiovascular: Normal rate, regular rhythm and normal heart sounds.   Pulmonary/Chest: Effort normal and breath sounds normal.  Abdominal: Soft. There is no tenderness.  Neurological: She is alert.  Skin: Skin is warm and dry.  Psychiatric: She has a normal mood and affect.    ED Course  Procedures (including critical care time) Labs Review Labs Reviewed  CBC WITH DIFFERENTIAL - Abnormal; Notable for the following:    RBC 2.99 (*)    Hemoglobin 9.4 (*)    HCT 30.4 (*)    MCV 101.7 (*)    RDW 17.3 (*)    Platelets 108 (*)    All other components within normal limits  BASIC METABOLIC PANEL - Abnormal; Notable for the following:    CO2 35 (*)    GFR calc non Af Amer 79 (*)    All other components within normal limits   PROTIME-INR - Abnormal; Notable for the following:    Prothrombin Time 18.0 (*)    INR 1.53 (*)    All other components within normal limits  GLUCOSE, CAPILLARY - Abnormal; Notable  for the following:    Glucose-Capillary 65 (*)    All other components within normal limits  MAGNESIUM  PHOSPHORUS  BASIC METABOLIC PANEL   Imaging Review Dg Chest 2 View  01/11/2013   CLINICAL DATA:  Dysphagia  EXAM: CHEST  2 VIEW  COMPARISON:  12/22/2012  FINDINGS: Persistent moderate pleural effusions with compressive basilar atelectasis/ consolidation. Difficult to exclude pneumonia. Heart is enlarged. Left subclavian pacer evident. Atherosclerosis of the aorta. Degenerative changes of the spine. Chronic mid thoracic compression fracture. Increased thoracic kyphosis noted.  IMPRESSION: Persistent cardiomegaly with bilateral pleural effusions and basilar atelectasis/consolidation. No significant interval change.   Electronically Signed   By: Ruel Favors M.D.   On: 01/11/2013 13:36    EKG Interpretation   None      1:00 PM Patient seen and examined. Work-up initiated.    Vital signs reviewed and are as follows: Filed Vitals:   01/11/13 1234  BP: 120/38  Pulse: 70  Temp: 97.9 F (36.6 C)   2:15 PM patient discussed with and seen by Dr. Anitra Lauth. Will give small fluid bolus and maintenance fluids. I spoke with Dr. Elnoria Howard who will see patient in ED. Will admit to unassigned medicine. Pending labs.   IMTS to admit.    MDM   1. Dysphagia    Admit for above.     Renne Crigler, PA-C 01/11/13 [phone]

## 2013-01-11 NOTE — H&P (Signed)
Date: 01/11/2013               Patient Name:  Dawn Barrett MRN: [id number]  DOB: Dec 21, 1918 Age / Sex: 79 y.o., female   PCP: Provider Default, MD         Medical Service: Internal Medicine Teaching Service         Attending Physician: Dr. Kem Kays     First Contact: Dr. Yetta Barre  Pager: [phone]  Second Contact: Dr. Shirlee Latch Pager: [phone]       After Hours (After 5p/  First Contact Pager: [phone]  weekends / holidays): Second Contact Pager: [phone]   Chief Complaint: dysphagia   History of Present Illness:  Dawn Barrett is a 79 y.o. female w/ PMHx of CVA (L. MCA, 2009), COPD, HTN, CHF, HOCM, paroxysmal A-fib, symptomatic bradycardia w/ PPM, and known history of dysphagia (2/2 CVA), presents from home w/ worsening dysphagia since Friday. Patient accompanied by daughter who provided history. Dawn Barrett typically eats pureed diet and has a large appetite on most days, but since Friday, she has been eating less and has increased difficulty swallowing foods completely w/ repeated regurgitation. Since this time she has been almost completely unable to take food or liquid by mouth. She is taken care of by [facility] who started D5NS at home. The patient also has a chronic history of aspiration. The daughter also claims her mother has been more lethargic ovr the past few days as well. Patient was recently admitted on 12/04/12 for acute on chronic respiratory failure most likely d/t aspiration pneumonia, acute encephalopathy, and dehydration. Seen by Dr. Juanda Chance at this time to discuss PEG vs PICC placement. PICC was placed and patent was discharged. Daughter refused palliative care consult during previous admission. Otherwise, patient has been afebrile w/ 100% SpO2 at home. No other complaints as per daughter.   Meds: Current Facility-Administered Medications  Medication Dose Route Frequency Provider Last Rate Last Dose  . 0.9 %  sodium chloride infusion  250 mL Intravenous PRN Annett Gula, MD      . dextrose 5 %-0.45 % sodium chloride infusion   Intravenous Continuous Annett Gula, MD      . dextrose 50 % solution 25 mL  25 mL Intravenous Once PRN Annett Gula, MD      . enoxaparin (LOVENOX) injection 40 mg  40 mg Subcutaneous Q24H Annett Gula, MD      . levalbuterol [facility]) nebulizer solution 0.63 mg  0.63 mg Nebulization Q6H PRN Annett Gula, MD      . nitroGLYCERIN (NITROSTAT) SL tablet 0.4 mg  0.4 mg Sublingual Q5 Min x 3 PRN Annett Gula, MD      . sodium chloride 0.9 % injection 3 mL  3 mL Intravenous Q12H Annett Gula, MD      . sodium chloride 0.9 % injection 3 mL  3 mL Intravenous Q12H Annett Gula, MD      . sodium chloride 0.9 % injection 3 mL  3 mL Intravenous PRN Annett Gula, MD        Allergies: Allergies as of 01/11/2013 - Review Complete 01/11/2013  Allergen Reaction Noted  . Codeine Other (See Comments)   . Sulfonamide derivatives Other (See Comments)    Past Medical History  Diagnosis Date  . Stroke     a. L MCA CVA 2009.  Marland Kitchen Hypertension   . COPD (chronic obstructive pulmonary disease)   . Paroxysmal atrial fibrillation   .  NSTEMI (non-ST elevated myocardial infarction)     a. 10/2012 - managed medically.  . Chronic combined systolic and diastolic CHF (congestive heart failure)   . Symptomatic bradycardia     a. s/p St. Jude pacemaker 2009.  Marland Kitchen PNA (pneumonia)     a. Per notes, h/o pulm injury r/t PNA.  aspiration PNA 11/2012  . LBBB (left bundle branch block)   . HOCM (hypertrophic obstructive cardiomyopathy)     a. 2D Echo 10/2012: EF 40-45%, mild LVH, mild-mod TR, mitral SAM with mod MR, LVOT gradient not well assessed but at least 2.75m/s; findings c/w HOCM.  Marland Kitchen Mitral regurgitation     a. Mod MR by echo 10/2012.  . Tricuspid regurgitation     a. Mild-mod TR by echo 10/2012.  Marland Kitchen Dysphagia    Past Surgical History  Procedure Laterality Date  . Pacemaker insertion  11/19/2007    St. Jude Zephyr 5020 pulse generator,  serial [id number]  . Femur surgery Right 06/2011    Rod inserted after a fall.    Family History  Problem Relation Age of Onset  . Heart disease Mother    History   Social History  . Marital Status: Widowed    Spouse Name: N/A    Number of Children: N/A  . Years of Education: N/A   Occupational History  . Retired    Social History Main Topics  . Smoking status: Never Smoker   . Smokeless tobacco: Not on file  . Alcohol Use: No  . Drug Use: No  . Sexual Activity: Not on file   Other Topics Concern  . Not on file   Social History Narrative   Pt lives with daughter in [place] but has been in [place] recently.    Review of Systems: General: Positive for fatigue and decreased appetite. Denies fever, chills, diaphoresis. Respiratory: Denies SOB, DOE, cough, chest tightness, and wheezing.   Cardiovascular: Positive for leg swelling. Denies chest pain, palpitations..  Gastrointestinal: Denies nausea, vomiting, abdominal pain, diarrhea, constipation, blood in stool and abdominal distention.  Genitourinary: Denies dysuria, urgency, frequency, hematuria, flank pain and difficulty urinating.  Musculoskeletal: Denies myalgias, back pain, joint swelling, arthralgias and gait problem.  Skin: Denies pallor, rash and wounds.  Neurological: Positive for dysphagia. Denies dizziness, seizures, syncope, weakness, lightheadedness, numbness and headaches.  Psychiatric/Behavioral: Denies mood changes, confusion, nervousness, sleep disturbance and agitation.  Physical Exam: Filed Vitals:   01/11/13 1400 01/11/13 1500 01/11/13 1600 01/11/13 1704  BP: 130/102 123/46 107/59 136/87  Pulse: 66 73 70 70  Temp:    97.9 F (36.6 C)  TempSrc:      Resp: 18 16  18   Height:    5\' 5"  (1.651 m)  Weight:    109 lb 2 oz (49.5 kg)  SpO2: 100% 100% 100% 100%   General: Vital signs reviewed.  Patient is a frail elderly female, in no acute distress and cooperative with exam. Alert and oriented x2.  Difficult to understand 2/2 poor speech. Head: Normocephalic and atraumatic. Eyes: PERRL, EOMI, conjunctivae normal, No scleral icterus.  Neck: Supple, trachea midline, normal ROM, No JVD, masses, thyromegaly, or carotid bruit present.  Cardiovascular: RRR, S1 normal, S2 normal, no murmurs, gallops, or rubs. Pulmonary/Chest: Normal respiratory effort, CTAB, no wheezes, rales, or rhonchi. Abdominal: Soft, non-tender, non-distended, bowel sounds are normal, no masses, organomegaly, or guarding present.  Musculoskeletal: No joint deformities, erythema, or stiffness, ROM full and no nontender. Extremities: Positive for 2+ LE edema. Pulses symmetric and  intact bilaterally. No cyanosis or clubbing. PICC placed in LUE. Hematoma present over inner R. elbow.  Neurological: A&O x2. Strength is minimal but symmetric bilaterally, cranial nerve II-XII are grossly intact, but difficult to assess completely as patient is lethargic. Skin: Warm, dry and intact. No rashes or erythema.  Lab results: Basic Metabolic Panel:  Recent Labs  16/10/96 1256 01/11/13 1620  NA 139  --   K 4.1  --   CL 100  --   CO2 35*  --   GLUCOSE 79  --   BUN 17  --   CREATININE 0.56  --   CALCIUM 8.4  --   MG  --  2.1  PHOS  --  2.9   CBC:  Recent Labs  01/11/13 1256  WBC 6.5  NEUTROABS 3.9  HGB 9.4*  HCT 30.4*  MCV 101.7*  PLT 108*   Coagulation:  Recent Labs  01/11/13 1340  LABPROT 18.0*  INR 1.53*   Imaging results:  Dg Chest 2 View  01/11/2013   CLINICAL DATA:  Dysphagia  EXAM: CHEST  2 VIEW  COMPARISON:  12/22/2012  FINDINGS: Persistent moderate pleural effusions with compressive basilar atelectasis/ consolidation. Difficult to exclude pneumonia. Heart is enlarged. Left subclavian pacer evident. Atherosclerosis of the aorta. Degenerative changes of the spine. Chronic mid thoracic compression fracture. Increased thoracic kyphosis noted.  IMPRESSION: Persistent cardiomegaly with bilateral pleural  effusions and basilar atelectasis/consolidation. No significant interval change.   Electronically Signed   By: Ruel Favors M.D.   On: 01/11/2013 13:36   Ct Head Wo Contrast  01/11/2013   CLINICAL DATA:  Difficulty swallowing since Friday, evaluate for CVA, history of prior CVA  EXAM: CT HEAD WITHOUT CONTRAST  TECHNIQUE: Contiguous axial images were obtained from the base of the skull through the vertex without intravenous contrast.  COMPARISON:  12/04/2012; 10/27/2007  FINDINGS: Grossly unchanged sequela of remote left-sided MCA distribution infarction with associated geographic encephalomalacia and a punctate area of dystrophic calcification within the prior infarct. Grossly unchanged findings of atrophy with sulcal prominence. Grossly unchanged rather extensive periventricular hypodensities are grossly unchanged in compatible microvascular ischemic disease. Bilateral basal ganglia calcifications. Given extensive background parenchymal abnormalities, there is no CT evidence of acute large territory infarct. No intraparenchymal or extra-axial mass or hemorrhage. Unchanged size and configuration of the ventricles and basilar cisterns including mild ex vacuo dilatation of the left lateral ventricle. No midline shift. Intracranial atherosclerosis. Limited visualization there is an air-fluid level within the right maxillary sinus. The remaining paranasal sinuses and mastoid air cells are normally aerated. Post bilateral cataract surgery. Regional soft tissues are normal. No displaced calvarial fracture.  IMPRESSION: 1. No acute intracranial process. 2. Stable sequela of remote left-sided MCA distribution infarct. 3. Grossly stable findings of atrophy and advanced microvascular ischemic disease. 4. Air-fluid level within the right maxillary sinus, nonspecific though could be seen in the setting of acute sinusitis.   Electronically Signed   By: Simonne Come M.D.   On: 01/11/2013 18:41   Other results: EKG:  none  Assessment & Plan by Problem: Dawn Barrett is a 79 y.o. female w/ PMHx of CVA (L. MCA, 2009), COPD, HTN, CHF, HOCM, paroxysmal A-fib, symptomatic bradycardia w/ PPM, and known history of dysphagia (2/2 CVA), presents from home w/ worsening dysphagia since 01/09/13.   Dysphagia- Patient w/ known history of dysphagia 2/2 CVA in 2009, however, significantly worse since 01/09/13. Concern for Aspiration with history of aspiration pneumonia.  Previously considered PEG  during last admission but received PICC instead. CXR was performed on admission, showed persistent cardiomegaly with bilateral pleural effusions and basilar atelectasis/consolidation, but with no significant interval change since previous admission. -Seen by Dr. Elnoria Howard and discussed acute regurgitation with him at bedside. Felt that CT head was necessary to rule out any acute changes, but was -ve. -Started on D5 1/2NS @ 10-20 ml/hr -SLP to see in AM. No need for MBS at this time as patient has a known history of severe dysphagia.  -Will also be seen by Dr. Juanda Chance who has a known history of this patient. Will reassess in AM. -Keep NPO for now.  -Monitor electrolytes. BMET in AM.   Chronic combined heart failure -On Lasix and Spironolactone at home. Follows w/ Dr. Graciela Husbands. -Will hold for diuretics for now. -Will also discuss w/ cardiology in AM as daughter had requested to have PPM interrogated.    Paroxysmal AF on Coumadin -Holding Coumadin until SLP evaluation. INR 1.53 on admission.  HTN- Controlled, monitor VS   Protein-calorie malnutrition, severe -Nutrition consulted   #DVT px -Lovenox   Dispo: Disposition is deferred at this time, awaiting improvement of current medical problems. Anticipated discharge in approximately 1-2 day(s).   The patient does have a current PCP (Provider Default, MD) and does not need an [facility] hospital follow-up appointment after discharge.  The patient does not have transportation  limitations that hinder transportation to clinic appointments.  Signed: Courtney Paris, MD 01/11/2013, 7:28 PM  Pager: [phone]

## 2013-01-11 NOTE — ED Notes (Signed)
Patient transported to X-ray 

## 2013-01-11 NOTE — ED Notes (Signed)
Difficulty swallowing since 8pm on Friday night; family reports she is usually a big eater (pureed foods), but since Friday she takes a few bites and within minutes she spits it out and is unable to swallow it. [facility] [place] nursing started bag of D5 last night at 15cc/hr.

## 2013-01-11 NOTE — Consult Note (Signed)
Consult for [facility]  Reason for Consult: Dysphagia Referring Physician: Teaching Service  Dawn Barrett HPI: This is a 79 year old female with recurrent aspiration pneumonia who was recently discharged from the hospital.  Her situation is a complex case and the plan was for her to receive IV hydration upon discharge through a PICC line.  In the Nursing Home she started to have issues with regurgitation.  As a result of her symptoms she was brought to the hospital.  Per Dr. Regino Schultze October consult note the patient has failed to swallowing evaluations.  She was to be on a Dysphagia 1 diet with nectar thick liquids.  A PEG was requested during the last evaluation, but it did not appear to be feasible.  She has afib and is on chronic coumadin.  [facility] was [facility] at [facility], but this was not pursued.  Past Medical History  Diagnosis Date  . Stroke     a. L MCA CVA 2009.  Marland Kitchen Hypertension   . COPD (chronic obstructive pulmonary disease)   . Paroxysmal atrial fibrillation   . NSTEMI (non-ST elevated myocardial infarction)     a. 10/2012 - managed medically.  . Chronic combined systolic and diastolic CHF (congestive heart failure)   . Symptomatic bradycardia     a. s/p St. Jude pacemaker 2009.  Marland Kitchen PNA (pneumonia)     a. Per notes, h/o pulm injury r/t PNA.  aspiration PNA 11/2012  . LBBB (left bundle branch block)   . HOCM (hypertrophic obstructive cardiomyopathy)     a. 2D Echo 10/2012: EF 40-45%, mild LVH, mild-mod TR, mitral SAM with mod MR, LVOT gradient not well assessed but at least 2.25m/s; findings c/w HOCM.  Marland Kitchen Mitral regurgitation     a. Mod MR by echo 10/2012.  . Tricuspid regurgitation     a. Mild-mod TR by echo 10/2012.  Marland Kitchen Dysphagia     Past Surgical History  Procedure Laterality Date  . Pacemaker insertion  11/19/2007    St. Jude Zephyr 5020 pulse generator, serial [id number]  . Femur surgery Right 06/2011    Rod inserted after a fall.     Family History  Problem Relation  Age of Onset  . Heart disease Mother     Social History:  reports that she has never smoked. She does not have any smokeless tobacco history on file. She reports that she does not drink alcohol or use illicit drugs.  Allergies:  Allergies  Allergen Reactions  . Codeine Other (See Comments)    REACTION: unknown  . Sulfonamide Derivatives Other (See Comments)    REACTION:  unknown    Medications:  Scheduled: . enoxaparin (LOVENOX) injection  40 mg Subcutaneous Q24H  . sodium chloride  3 mL Intravenous Q12H  . sodium chloride  3 mL Intravenous Q12H   Continuous: . dextrose 5 % and 0.45% NaCl      Results for orders placed during the hospital encounter of 01/11/13 (from the past 24 hour(s))  CBC WITH DIFFERENTIAL     Status: Abnormal   Collection Time    01/11/13 12:56 PM      Result Value Range   WBC 6.5  4.0 - 10.5 K/uL   RBC 2.99 (*) 3.87 - 5.11 MIL/uL   Hemoglobin 9.4 (*) 12.0 - 15.0 g/dL   HCT 62.1 (*) 30.8 - 65.7 %   MCV 101.7 (*) 78.0 - 100.0 fL   MCH 31.4  26.0 - 34.0 pg   MCHC 30.9  30.0 - 36.0 g/dL   RDW 81.1 (*) 91.4 - 78.2 %   Platelets 108 (*) 150 - 400 K/uL   Neutrophils Relative % 60  43 - 77 %   Neutro Abs 3.9  1.7 - 7.7 K/uL   Lymphocytes Relative 32  12 - 46 %   Lymphs Abs 2.1  0.7 - 4.0 K/uL   Monocytes Relative 7  3 - 12 %   Monocytes Absolute 0.5  0.1 - 1.0 K/uL   Eosinophils Relative 1  0 - 5 %   Eosinophils Absolute 0.0  0.0 - 0.7 K/uL   Basophils Relative 0  0 - 1 %   Basophils Absolute 0.0  0.0 - 0.1 K/uL  BASIC METABOLIC PANEL     Status: Abnormal   Collection Time    01/11/13 12:56 PM      Result Value Range   Sodium 139  135 - 145 mEq/L   Potassium 4.1  3.5 - 5.1 mEq/L   Chloride 100  96 - 112 mEq/L   CO2 35 (*) 19 - 32 mEq/L   Glucose, Bld 79  70 - 99 mg/dL   BUN 17  6 - 23 mg/dL   Creatinine, Ser 9.56  0.50 - 1.10 mg/dL   Calcium 8.4  8.4 - 21.3 mg/dL   GFR calc non Af Amer 79 (*) >90 mL/min   GFR calc Af Amer >90  >90 mL/min   PROTIME-INR     Status: Abnormal   Collection Time    01/11/13  1:40 PM      Result Value Range   Prothrombin Time 18.0 (*) 11.6 - 15.2 seconds   INR 1.53 (*) 0.00 - 1.49     Dg Chest 2 View  01/11/2013   CLINICAL DATA:  Dysphagia  EXAM: CHEST  2 VIEW  COMPARISON:  12/22/2012  FINDINGS: Persistent moderate pleural effusions with compressive basilar atelectasis/ consolidation. Difficult to exclude pneumonia. Heart is enlarged. Left subclavian pacer evident. Atherosclerosis of the aorta. Degenerative changes of the spine. Chronic mid thoracic compression fracture. Increased thoracic kyphosis noted.  IMPRESSION: Persistent cardiomegaly with bilateral pleural effusions and basilar atelectasis/consolidation. No significant interval change.   Electronically Signed   By: Ruel Favors M.D.   On: 01/11/2013 13:36    ROS:  As stated above in the HPI otherwise negative.  Blood pressure 107/59, pulse 70, temperature 97.9 F (36.6 C), temperature source Oral, resp. rate 16, height 5\' 5"  (1.651 m), weight 128 lb (58.06 kg), SpO2 100.00%.    PE: Gen: NAD, Alert and Oriented HEENT:  [place]/AT, EOMI Neck: Supple, no LAD Lungs: CTA Bilaterally CV: RRR without M/G/R ABM: Soft, NTND, +BS Ext: No C/C/E  Assessment/Plan: 1) Dysphagia from CVA. 2) History of aspiration pneumonia.   I was able to speak with her daughter who provided a good amount of information, however, I am still unclear about the issue with the PEG and her PO intake.  This acute regurgitation is new and the daughter denies that this has ever occurred.  I think in this situation it is worthwhile to perform a noncontrast CT scan of the brain to rule out any acute changes.  Her daughter reported that there was some change to her mental status acutely, but she seems to be at her baseline right now.  Plan: 1) CT scan of the brain. 2) I will notify Dr. Juanda Chance about her patient.  Further discussions of work up and management will be pursued  pending Dr. Regino Schultze  evaluation.  Legend Pecore D 01/11/2013, 4:45 PM

## 2013-01-11 NOTE — ED Notes (Signed)
PICC intact to left upper arm. Dressing clean, dry, intact. Blood return and flush present.

## 2013-01-11 NOTE — ED Notes (Signed)
Returned from xray

## 2013-01-12 ENCOUNTER — Encounter: Payer: Self-pay | Admitting: Internal Medicine

## 2013-01-12 DIAGNOSIS — B3781 Candidal esophagitis: Secondary | ICD-10-CM | POA: Diagnosis present

## 2013-01-12 DIAGNOSIS — L89152 Pressure ulcer of sacral region, stage 2: Secondary | ICD-10-CM | POA: Diagnosis present

## 2013-01-12 DIAGNOSIS — R1310 Dysphagia, unspecified: Principal | ICD-10-CM

## 2013-01-12 LAB — BASIC METABOLIC PANEL
BUN: 17 mg/dL (ref 6–23)
CO2: 35 mEq/L — ABNORMAL HIGH (ref 19–32)
Calcium: 8.4 mg/dL (ref 8.4–10.5)
GFR calc Af Amer: >90 mL/min (ref >90)
GFR calc non Af Amer: 81 mL/min — ABNORMAL LOW (ref >90)
Sodium: 139 mEq/L (ref 135–145)

## 2013-01-12 LAB — ALBUMIN: Albumin: 1.9 g/dL — ABNORMAL LOW (ref 3.5–5.2)

## 2013-01-12 LAB — GLUCOSE, CAPILLARY: Glucose-Capillary: 83 mg/dL (ref 70–99)

## 2013-01-12 MED ORDER — PRO-STAT SUGAR FREE PO LIQD: 30.0000 mL | Freq: Two times a day (BID) | ORAL | Status: DC

## 2013-01-12 MED ORDER — HEPARIN SOD (PORK) LOCK FLUSH 100 UNIT/ML IV SOLN
250.0000 [IU] | Freq: Every day | INTRAVENOUS | Status: DC
  Filled 2013-01-12: qty 3

## 2013-01-12 MED ORDER — HEPARIN SOD (PORK) LOCK FLUSH 100 UNIT/ML IV SOLN
250.0000 [IU] | INTRAVENOUS | Status: DC | PRN
  Administered 2013-01-12: 250 [IU]
  Filled 2013-01-12: qty 3

## 2013-01-12 MED ORDER — DEXTROSE-NACL 5-0.45 % IV SOLN: INTRAVENOUS | Status: DC

## 2013-01-12 MED ORDER — FLUCONAZOLE 100MG IVPB: 100.0000 mg | INTRAVENOUS | Status: DC

## 2013-01-12 MED ORDER — ENSURE PUDDING PO PUDG: 1.0000 | Freq: Three times a day (TID) | ORAL | Status: DC

## 2013-01-12 MED ORDER — FLUCONAZOLE 100MG IVPB
100.0000 mg | INTRAVENOUS | Status: DC
  Administered 2013-01-12: 100 mg via INTRAVENOUS
  Filled 2013-01-12: qty 50

## 2013-01-12 NOTE — Discharge Summary (Signed)
Name: CARLISLE TORGESON MRN: [id number] DOB: [birth year] 79 y.o. PCP: Provider Default, MD  Date of Admission: 01/11/2013 12:27 PM Date of Discharge: 01/12/2013 Attending Physician: Inez Catalina, MD  Discharge Diagnosis: 1. Dysphagia- Patient w/ a known history of dysphagia, recently worsened, resulting in regurgitation, even of pudding thick liquids. Has not been able to keep anything down. Suspected candidal esophagitis as patient has had oral candida in the recent past. D/c'ed w/ IV Diflucan as patient has a PICC line and visiting doctor service.  2. CHF-On lasix and spironolactone at home.  3. Paroxysmal Atrial Fibrillation-On coumadin 4. HTN 5. Protein calorie malnutrition  Discharge Medications:   Medication List         amiodarone 100 MG tablet  Commonly known as:  PACERONE  Take 1 tablet (100 mg total) by mouth daily.     cholecalciferol 1000 UNITS tablet  Commonly known as:  VITAMIN D  Take 2,000 Units by mouth daily.     dextrose 5 % and 0.45% NaCl SOLN 1,000 mL  Infuse at a rate of 10-20 ml/hr.     fluconazole 2 mg/mL Soln IVPB  Commonly known as:  DIFLUCAN  Inject 50 mLs (100 mg total) into the vein daily.     furosemide 20 MG tablet  Commonly known as:  LASIX  Take 20 mg by mouth daily as needed for fluid.     levalbuterol 0.63 MG/3ML nebulizer solution  Commonly known as:  XOPENEX  Take 3 mLs (0.63 mg total) by nebulization every 6 (six) hours as needed for wheezing.     nitroGLYCERIN 0.4 MG SL tablet  Commonly known as:  NITROSTAT  Place 1 tablet (0.4 mg total) under the tongue every 5 (five) minutes x 3 doses as needed for chest pain.     PROBIOTIC PO  Take 1 tablet by mouth daily.     spironolactone 25 MG tablet  Commonly known as:  ALDACTONE  Take 25 mg by mouth daily.     VITAMIN B-12 PO  Take 1 tablet by mouth daily.     vitamin C 500 MG tablet  Commonly known as:  ASCORBIC ACID  Take 500 mg by mouth daily.     warfarin 1 MG tablet   Commonly known as:  COUMADIN  Take 0.5-1 mg by mouth daily. Take 1/2 tablet on Thursday and Sundays then take 1 tablet the other days        Disposition and follow-up:   Ms.Sabriah B Tauer was discharged from [facility] in Good condition.  At the hospital follow up visit please address:  1.  Ability to swallow dysphagia 1 diet (pudding thick liquids). Has patient improved after full course of Diflucan? Any fever/chills? Signs of aspiration pneumonia?  2.  Labs / imaging needed at time of follow-up: CBC  3.  Pending labs/ test needing follow-up: none  Follow-up Appointments:   Discharge Instructions:      Future Appointments Provider Department Dept Phone   02/02/2013 3:30 PM [facility] [place] Office [phone]      Consultations:    Procedures Performed:  Dg Chest 2 View  01/11/2013   CLINICAL DATA:  Dysphagia  EXAM: CHEST  2 VIEW  COMPARISON:  12/22/2012  FINDINGS: Persistent moderate pleural effusions with compressive basilar atelectasis/ consolidation. Difficult to exclude pneumonia. Heart is enlarged. Left subclavian pacer evident. Atherosclerosis of the aorta. Degenerative changes of the spine. Chronic mid thoracic compression fracture. Increased thoracic kyphosis  noted.  IMPRESSION: Persistent cardiomegaly with bilateral pleural effusions and basilar atelectasis/consolidation. No significant interval change.   Electronically Signed   By: Ruel Favors M.D.   On: 01/11/2013 13:36   Ct Head Wo Contrast  01/11/2013   CLINICAL DATA:  Difficulty swallowing since Friday, evaluate for CVA, history of prior CVA  EXAM: CT HEAD WITHOUT CONTRAST  TECHNIQUE: Contiguous axial images were obtained from the base of the skull through the vertex without intravenous contrast.  COMPARISON:  12/04/2012; 10/27/2007  FINDINGS: Grossly unchanged sequela of remote left-sided MCA distribution infarction with associated geographic encephalomalacia  and a punctate area of dystrophic calcification within the prior infarct. Grossly unchanged findings of atrophy with sulcal prominence. Grossly unchanged rather extensive periventricular hypodensities are grossly unchanged in compatible microvascular ischemic disease. Bilateral basal ganglia calcifications. Given extensive background parenchymal abnormalities, there is no CT evidence of acute large territory infarct. No intraparenchymal or extra-axial mass or hemorrhage. Unchanged size and configuration of the ventricles and basilar cisterns including mild ex vacuo dilatation of the left lateral ventricle. No midline shift. Intracranial atherosclerosis. Limited visualization there is an air-fluid level within the right maxillary sinus. The remaining paranasal sinuses and mastoid air cells are normally aerated. Post bilateral cataract surgery. Regional soft tissues are normal. No displaced calvarial fracture.  IMPRESSION: 1. No acute intracranial process. 2. Stable sequela of remote left-sided MCA distribution infarct. 3. Grossly stable findings of atrophy and advanced microvascular ischemic disease. 4. Air-fluid level within the right maxillary sinus, nonspecific though could be seen in the setting of acute sinusitis.   Electronically Signed   By: Simonne Come M.D.   On: 01/11/2013 18:41   Dg Chest Portable 1 View  12/22/2012   CLINICAL DATA:  Recent pneumonia, abnormal labs, history stroke, COPD, hypertension, MI  EXAM: PORTABLE CHEST - 1 VIEW  COMPARISON:  Portable exam 2120 hr compared to 12/03/2012  FINDINGS: Left subclavian transvenous pacemaker leads project over right atrium and right ventricle.  Enlargement of cardiac silhouette.  Atherosclerotic calcification aorta.  Bibasilar effusions and atelectasis, cannot exclude consolidation at lung bases.  Underlying COPD.  No pneumothorax.  Bones demineralized with multiple old right rib fractures.  IMPRESSION: Enlargement of cardiac silhouette post pacemaker.   Changes of COPD with bibasilar pleural effusions and atelectasis, unable to exclude underlying consolidation at lung bases.   Electronically Signed   By: Ulyses Southward M.D.   On: 12/22/2012 21:33   2D Echo: 11/14/2012  Study Conclusions: - Left ventricle: The cavity size was normal. Wall thickness was increased in a pattern of mild LVH. There was moderate focal basal hypertrophy of the septum. Systolic function was mildly to moderately reduced. The estimated ejection fraction was in the range of 40% to 45%. There is akinesis of the apical myocardium. Doppler parameters are consistent with abnormal left ventricular relaxation (grade 1 diastolic dysfunction). Doppler parameters are consistent with high ventricular filling pressure. - Mitral valve: Calcified annulus. There was moderate systolic anterior motion of the anterior leaflet, posterior leaflet, and chordal structures. Moderate regurgitation. - Left atrium: The atrium was mildly dilated. - Tricuspid valve: Mild-moderate regurgitation. - Pulmonary arteries: Systolic pressure was moderately increased. PA peak pressure: 61mm Hg (S). - Pericardium, extracardiac: A trivial pericardial effusion was identified.  Impressions: - Apical akinesis with hyperdynamic basal function; overall EF 40-45; trivial pericardial effusion; mitral SAMwith moderate MR; LVOT gradient not well assessed but at least 2.4 m/s; findings consistent with HOCM.  Admission HPI:  Ms. DELORSE SHANE is a  79 y.o. female w/ PMHx of CVA (L. MCA, 2009), COPD, HTN, CHF, HOCM, paroxysmal A-fib, symptomatic bradycardia w/ PPM, and known history of dysphagia (2/2 CVA), presents from home w/ worsening dysphagia since Friday. Patient accompanied by daughter who provided history. Ms. Derocher typically eats pureed diet and has a large appetite on most days, but since Friday, she has been eating less and has increased difficulty swallowing foods completely w/ repeated  regurgitation. Since this time she has been almost completely unable to take food or liquid by mouth. She is taken care of by [facility] who started D5NS at home. The patient also has a chronic history of aspiration. The daughter also claims her mother has been more lethargic ovr the past few days as well.  Patient was recently admitted on 12/04/12 for acute on chronic respiratory failure most likely d/t aspiration pneumonia, acute encephalopathy, and dehydration. Seen by Dr. Juanda Chance at this time to discuss PEG vs PICC placement. PICC was placed and patent was discharged. Daughter refused palliative care consult during previous admission.  Otherwise, patient has been afebrile w/ 100% SpO2 at home. No other complaints as per daughter.   Hospital Course by problem list:   1. Dysphagia- Patient w/ known history of dysphagia 2/2 CVA in 2009, however, significantly worse since 01/09/13. Concern for Aspiration with history of aspiration pneumonia. Previously considered PEG during last admission but received PICC instead. CXR was performed on admission, showed persistent cardiomegaly with bilateral pleural effusions and basilar atelectasis/consolidation, but with no significant interval change since previous admission. CT head was performed, showed no acute abnormality accounting for worsened dysphagia. No SLP evaluation as patient already has known history of severe dysphagia. GI consulted. Patient has recent history of oral candidiasis, was treated w/ 1 dose of po Diflucan 150 mg. Likely cause of recent dysphagia 2/2 candidal esophagitis. Sent home w/ Diflucan 100 mg IV qd for 10 days to administer through previously placed PICC line.   2. CHF- On Lasix and Spironolactone at home. Follows w/ Dr. Graciela Husbands. Has a PPM which was interrogated during her visit. Diuretics held during admission.  3. Paroxysmal Atrial Fibrillation- On Coumadin at home. INR 1.53 on admission, most likely d/t inability to take po. Patient's family  informed that Diflucan can alter Coumadin metabolism and that it is vital to check INR every 2-3 days on discharge in order to make sure levels are therapeutic.  4. HTN- Not an issue during admission.   5. Protein calorie Malnutrition- Offered nutrition consult to the family, which was declined by the daughter.   Discharge Vitals:   BP 108/68  Pulse 68  Temp(Src) 97.1 F (36.2 C) (Axillary)  Resp 18  Ht 5\' 5"  (1.651 m)  Wt 109 lb 2 oz (49.499 kg)  BMI 18.16 kg/m2  SpO2 99%  Discharge Labs:  Results for orders placed during the hospital encounter of 01/11/13 (from the past 24 hour(s))  CBC WITH DIFFERENTIAL     Status: Abnormal   Collection Time    01/11/13 12:56 PM      Result Value Range   WBC 6.5  4.0 - 10.5 K/uL   RBC 2.99 (*) 3.87 - 5.11 MIL/uL   Hemoglobin 9.4 (*) 12.0 - 15.0 g/dL   HCT 82.9 (*) 56.2 - 13.0 %   MCV 101.7 (*) 78.0 - 100.0 fL   MCH 31.4  26.0 - 34.0 pg   MCHC 30.9  30.0 - 36.0 g/dL   RDW 86.5 (*) 78.4 - 69.6 %  Platelets 108 (*) 150 - 400 K/uL   Neutrophils Relative % 60  43 - 77 %   Neutro Abs 3.9  1.7 - 7.7 K/uL   Lymphocytes Relative 32  12 - 46 %   Lymphs Abs 2.1  0.7 - 4.0 K/uL   Monocytes Relative 7  3 - 12 %   Monocytes Absolute 0.5  0.1 - 1.0 K/uL   Eosinophils Relative 1  0 - 5 %   Eosinophils Absolute 0.0  0.0 - 0.7 K/uL   Basophils Relative 0  0 - 1 %   Basophils Absolute 0.0  0.0 - 0.1 K/uL  BASIC METABOLIC PANEL     Status: Abnormal   Collection Time    01/11/13 12:56 PM      Result Value Range   Sodium 139  135 - 145 mEq/L   Potassium 4.1  3.5 - 5.1 mEq/L   Chloride 100  96 - 112 mEq/L   CO2 35 (*) 19 - 32 mEq/L   Glucose, Bld 79  70 - 99 mg/dL   BUN 17  6 - 23 mg/dL   Creatinine, Ser 1.61  0.50 - 1.10 mg/dL   Calcium 8.4  8.4 - 09.6 mg/dL   GFR calc non Af Amer 79 (*) >90 mL/min   GFR calc Af Amer >90  >90 mL/min  PROTIME-INR     Status: Abnormal   Collection Time    01/11/13  1:40 PM      Result Value Range   Prothrombin  Time 18.0 (*) 11.6 - 15.2 seconds   INR 1.53 (*) 0.00 - 1.49  MAGNESIUM     Status: None   Collection Time    01/11/13  4:20 PM      Result Value Range   Magnesium 2.1  1.5 - 2.5 mg/dL  PHOSPHORUS     Status: None   Collection Time    01/11/13  4:20 PM      Result Value Range   Phosphorus 2.9  2.3 - 4.6 mg/dL  GLUCOSE, CAPILLARY     Status: Abnormal   Collection Time    01/11/13  4:59 PM      Result Value Range   Glucose-Capillary 65 (*) 70 - 99 mg/dL  GLUCOSE, CAPILLARY     Status: Abnormal   Collection Time    01/11/13  8:51 PM      Result Value Range   Glucose-Capillary 68 (*) 70 - 99 mg/dL  GLUCOSE, CAPILLARY     Status: Abnormal   Collection Time    01/11/13  9:47 PM      Result Value Range   Glucose-Capillary 125 (*) 70 - 99 mg/dL  GLUCOSE, CAPILLARY     Status: None   Collection Time    01/12/13  4:35 AM      Result Value Range   Glucose-Capillary 78  70 - 99 mg/dL  BASIC METABOLIC PANEL     Status: Abnormal   Collection Time    01/12/13  5:45 AM      Result Value Range   Sodium 139  135 - 145 mEq/L   Potassium 3.6  3.5 - 5.1 mEq/L   Chloride 101  96 - 112 mEq/L   CO2 35 (*) 19 - 32 mEq/L   Glucose, Bld 87  70 - 99 mg/dL   BUN 17  6 - 23 mg/dL   Creatinine, Ser 0.45  0.50 - 1.10 mg/dL   Calcium 8.4  8.4 - 40.9 mg/dL  GFR calc non Af Amer 81 (*) >90 mL/min   GFR calc Af Amer >90  >90 mL/min  GLUCOSE, CAPILLARY     Status: None   Collection Time    01/12/13  7:29 AM      Result Value Range   Glucose-Capillary 83  70 - 99 mg/dL   Comment 1 Documented in Chart     Comment 2 Notify RN    GLUCOSE, CAPILLARY     Status: None   Collection Time    01/12/13 11:36 AM      Result Value Range   Glucose-Capillary 83  70 - 99 mg/dL   Comment 1 Documented in Chart     Comment 2 Notify RN      Signed: Courtney Paris, MD 01/12/2013, 12:36 PM   Time Spent on Discharge: 35 minutes Services Ordered on Discharge: none Equipment Ordered on Discharge: none

## 2013-01-12 NOTE — Progress Notes (Addendum)
INITIAL NUTRITION ASSESSMENT  DOCUMENTATION CODES Per approved criteria  -Severe malnutrition in the context of chronic illness -Underweight   INTERVENTION: Monitor magnesium, potassium, and phosphorus daily for at least 3 days, MD to replete as needed, as pt is at risk for refeeding syndrome given severe weight loss. Provided caregiver with information on where to buy Prostat when d/c. Add 30 ml Prostat po BID, each supplement provides 100 kcal and 15 grams protein. Mix in purees or thicken to appropriate consistency. Provide once pt is no longer NPO. RD to continue to follow nutrition care plan.  NUTRITION DIAGNOSIS: Inadequate oral intake related to dysphagia as evidenced by ongoing weight loss and poor oral intake.   Goal: Diet advancement within GOC. Maximize oral intake within GOC.  Monitor:  weight trends, lab trends, I/O's, swallow ability  Reason for Assessment: MD Consult  79 y.o. female  Admitting Dx: Dysphagia  ASSESSMENT: PMHx significant for CVA, COPD, HTN, CHF, and known hx of dysphagia (2/2 CVA). Admitted with worsening dysphagia.   Per H&P, pt typically eats a pureed diet and has a "large appetite" on most days, however pt began to eat less and has increased difficulty swallowing foods completely w/ repeated regurgitation. She is taken care of by [facility] who started D5NS at home. The patient also has a chronic history of aspiration. The daughter also claims her mother has been more lethargic over the past few days as well.  GI evaluated pt and noted pt with thrush recently, recommended continue diflucan and to not feed while in hospital.   SLP attempted evaluation today, however daughter refused stating that she didn't want SLP to evaluate patient until the IV diflucan has worked. Daughter wants pt to d/c home by noon today.  RD spoke with caregiver. Per caregiver and RN, pt takes very limited PO's. Has Ensure Pudding at home but does not take it. Family would like  Prostat. RD provided information on where to purchase Prostat when d/c and left contact info for daughter (not present.) Caregiver reports that pt only takes pudding-thickened liquids and pureed textures.  Pt with 19% wt loss x 1 month - this is significant for time frame. Pt meets criteria for severe MALNUTRITION in the context of chronic illness as evidenced by 19% wt loss x 1 month and intake of <75% x at least 1 month.  Pt with normal potassium, phos and magnesium at this time.   WOC RN saw pt on 11/17 for sacral wound.  Height: Ht Readings from Last 1 Encounters:  01/11/13 5\' 5"  (1.651 m)    Weight: Wt Readings from Last 1 Encounters:  01/11/13 109 lb 2 oz (49.499 kg)    Ideal Body Weight: 125 lb  % Ideal Body Weight: 87%  Wt Readings from Last 10 Encounters:  01/11/13 109 lb 2 oz (49.499 kg)  12/09/12 143 lb 4.8 oz (65 kg)  12/09/12 143 lb 4.8 oz (65 kg)  11/26/12 130 lb (58.968 kg)  11/21/12 128 lb 15.5 oz (58.5 kg)  08/05/12 120 lb 12.8 oz (54.795 kg)  02/09/10 130 lb (58.968 kg)  06/28/09 134 lb (60.782 kg)  11/23/08 135 lb (61.236 kg)    Usual Body Weight: 130 - 140 lb  % Usual Body Weight: 81%  BMI:  Body mass index is 18.16 kg/(m^2). Underweight  Estimated Nutritional Needs: Kcal: 1500 - 1700 Protein: 70 - 85 g Fluid: 1.5 - 1.7 liters  Skin: stage III pressure ulcer to sacrum  Diet Order: NPO  EDUCATION  NEEDS: -No education needs identified at this time   Intake/Output Summary (Last 24 hours) at 01/12/13 1052 Last data filed at 01/11/13 1919  Gross per 24 hour  Intake      0 ml  Output      0 ml  Net      0 ml    Last BM: PTA  Labs:   Recent Labs Lab 01/11/13 1256 01/11/13 1620 01/12/13 0545  NA 139  --  139  K 4.1  --  3.6  CL 100  --  101  CO2 35*  --  35*  BUN 17  --  17  CREATININE 0.56  --  0.51  CALCIUM 8.4  --  8.4  MG  --  2.1  --   PHOS  --  2.9  --   GLUCOSE 79  --  87    CBG (last 3)   Recent Labs   01/11/13 2147 01/12/13 0435 01/12/13 0729  GLUCAP 125* 78 83    Scheduled Meds: . enoxaparin (LOVENOX) injection  40 mg Subcutaneous Q24H  . feeding supplement (ENSURE)  1 Container Oral TID BM  . fluconazole (DIFLUCAN) IV  100 mg Intravenous Q24H  . levalbuterol  0.63 mg Nebulization Q6H  . sodium chloride  3 mL Intravenous Q12H  . sodium chloride  3 mL Intravenous Q12H    Continuous Infusions:   Past Medical History  Diagnosis Date  . Stroke     a. L MCA CVA 2009.  Marland Kitchen Hypertension   . COPD (chronic obstructive pulmonary disease)   . Paroxysmal atrial fibrillation   . NSTEMI (non-ST elevated myocardial infarction)     a. 10/2012 - managed medically.  . Chronic combined systolic and diastolic CHF (congestive heart failure)   . Symptomatic bradycardia     a. s/p St. Jude pacemaker 2009.  Marland Kitchen PNA (pneumonia)     a. Per notes, h/o pulm injury r/t PNA.  aspiration PNA 11/2012  . LBBB (left bundle branch block)   . HOCM (hypertrophic obstructive cardiomyopathy)     a. 2D Echo 10/2012: EF 40-45%, mild LVH, mild-mod TR, mitral SAM with mod MR, LVOT gradient not well assessed but at least 2.41m/s; findings c/w HOCM.  Marland Kitchen Mitral regurgitation     a. Mod MR by echo 10/2012.  . Tricuspid regurgitation     a. Mild-mod TR by echo 10/2012.  Marland Kitchen Dysphagia     Past Surgical History  Procedure Laterality Date  . Pacemaker insertion  11/19/2007    St. Jude Zephyr 5020 pulse generator, serial [id number]  . Femur surgery Right 06/2011    Rod inserted after a fall.     Jarold Motto MS, RD, LDN Pager: [phone] After-hours pager: [phone]

## 2013-01-12 NOTE — Progress Notes (Signed)
Small stage III pressure ulcer to sacrum measured and noted. Surrounding skin intact and blanchable. Applied sacral foam dressing. Will continue to monitor.

## 2013-01-12 NOTE — ED Provider Notes (Signed)
Medical screening examination/treatment/procedure(s) were conducted as a shared visit with non-physician practitioner(s) and myself.  I personally evaluated the patient during the encounter.  EKG Interpretation    Date/Time:    Ventricular Rate:    PR Interval:    QRS Duration:   QT Interval:    QTC Calculation:   R Axis:     Text Interpretation:              Pt with difficulty swallowing and regurgitating all her food.  Concern for possible stricture or web of esophagus.  Also concern for early dehydration due to not eating or drinking anything for the last 3 days.  Gwyneth Sprout, MD 01/12/13 1357

## 2013-01-12 NOTE — H&P (Signed)
INTERNAL MEDICINE TEACHING SERVICE Attending Admission Note  Date: 01/12/2013  Patient name: ZIONA WICKENS  Medical record number: [id number]  Date of birth: 1918-09-03    I have seen and evaluated Marcella Dubs and discussed their care with the Residency Team.  79 yr old female with hx CVA, COPD , HTN, HOCM and P-Afib, CHF, s/p PPM for symptomatic bradycardia, with chronic dysphagia and aspiration, presented due to worsening dysphagia. I discussed the current presentation and plan of care with daughter at bedside. The patient has a PICC line that was placed after recent admission due to aspiration pneumonia.   Her daughter states that she has recently diagnosed with oral thrush and treated with diflucan. Appreciate GI recs. At this time, she can be treated with Diflucan IV for 10 days to see if her dysphagia improves. Her daughter understands that the recommendation is NPO and consideration of PEG placement, which she declines. She understands the chronic risk of aspiration and possibility of respiratory failure. She will need INR check every 2-3 days while on fluconazole to correct dose. Another option is to hold coumadin for 10 days given low daily risk of CVA, but I would recommend daily coumadin with monitoring. Otherwise, she is medically stable for D/C home. She has Doctors making house calls f/u.  Jonah Blue, DO, FACP Faculty [facility] Internal Medicine Residency Program 01/12/2013, 11:59 AM

## 2013-01-12 NOTE — Progress Notes (Signed)
Pt being discharged to home via ambulance. Pt to be discharged home with PICC line in place. IV medication prescriptions sent to [facility] per CM and MD orders. Daughter Rod Holler and caretaker Verlon Au at bedside currently. Discharge instructions, new prescriptions medications, and follow-up appts reviewed with pt's daughter. Handout on Diflucan given to pt's daughter. Will continue to monitor.  Dawn Barrett, Dawn Barrett

## 2013-01-12 NOTE — Progress Notes (Signed)
SLP Cancellation Note  Patient Details Name: Dawn Barrett MRN: [id number] DOB: April 16, 1918   Cancelled treatment:        Order received for swallow evaluation on 79 year old pt. With known high risk of aspiration, even with purees and honey thick liquids, but family desires to feed her and have refused PEG, but also have refused Palliative Care.  Per pt.'s daughter, the pt. Has thrush and swallowing has worsened due to this.  Daughter asked that SLP not see patient until the "IV diflucan has worked," however, daughter wants d/c home by noon today.  Discussed with RN and MD, who agree to d/c swallow evaluation.   Maryjo Rochester T 01/12/2013, 10:31 AM

## 2013-01-12 NOTE — Progress Notes (Signed)
[facility] Progress Note    Since last GI note: Chronic aspiration, family understands that and has decided to continue feeding her.    Her daughter Rod Holler was in room today.  She saw oral Thrush last week (very clearly describes it) and tells me that they gave her mother one (crushed up) diflucan pill and that the oral thrush resolved.  Her regurgitating started around the same time.  She is on/off augmentin frequently.  Objective: Vital signs in last 24 hours: Temp:  [97.1 F (36.2 C)-97.9 F (36.6 C)] 97.1 F (36.2 C) (11/17 0423) Pulse Rate:  [64-73] 64 (11/17 0423) Resp:  [16-18] 18 (11/17 0423) BP: (76-136)/(22-102) 100/48 mmHg (11/16 2105) SpO2:  [99 %-100 %] 100 % (11/17 0847) Weight:  [109 lb 2 oz (49.499 kg)-128 lb (58.06 kg)] 109 lb 2 oz (49.499 kg) (11/16 2059)   General: alert and oriented times 0 Heart: regular rate and rythm Abdomen: soft, non-tender, non-distended, normal bowel sounds OP: currently no thrush  Lab Results:  Recent Labs  01/11/13 1256  WBC 6.5  HGB 9.4*  PLT 108*  MCV 101.7*    Recent Labs  01/11/13 1256 01/12/13 0545  NA 139 139  K 4.1 3.6  CL 100 101  CO2 35* 35*  GLUCOSE 79 87  BUN 17 17  CREATININE 0.56 0.51  CALCIUM 8.4 8.4   No results found for this basename: PROT, ALBUMIN, AST, ALT, ALKPHOS, BILITOT, BILIDIR, IBILI,  in the last 72 hours  Recent Labs  01/11/13 1340  INR 1.53*    Medications: Scheduled Meds: . enoxaparin (LOVENOX) injection  40 mg Subcutaneous Q24H  . levalbuterol  0.63 mg Nebulization Q6H  . sodium chloride  3 mL Intravenous Q12H  . sodium chloride  3 mL Intravenous Q12H   Continuous Infusions:  PRN Meds:.sodium chloride, levalbuterol, nitroGLYCERIN, sodium chloride, sodium chloride    Assessment/Plan: 79 y.o. female with chronic aspiration, neurogenic; likely esophageal yeast infection  Her daughter very clearly describes thrush, treated with a single pill of diflucan. That  treatment duration would not have effectively treated esophageal candidiasis and I suspect that is what is causing her "acute on chronic" swallowing difficulty.  She has PICC line (keeps this at home) and daily SN care at home.  I think it is reasonable to treat with IV diflucan, empirically for now.  100mg  IV, once daily.  I suspect her swallowing will improve back to its baseline, which is poor.  Would not feed her in hospital but family understands risks and plans to continue feeding her at home.  I will order Iv diflucan for 10 day course.  Family asking about d/c today or tomorrow and I think that is fine, not planning on any GI testing currently.   Rachael Fee, MD  01/12/2013, 9:54 AM [facility] Pager [phone]

## 2013-01-12 NOTE — Progress Notes (Signed)
   CARE MANAGEMENT NOTE 01/12/2013  Patient:  Dawn Barrett, Dawn Barrett   Account Number:  [id number]  Date Initiated:  01/12/2013  Documentation initiated by:  Darlyne Russian  Subjective/Objective Assessment:   patient admitted with dysphagia, oral thrush. Lives at home with daughter, has 24hr caregivers.     Action/Plan:   NCM to follow fordischarge needs.  IV diflucan QD.   Anticipated DC Date:  01/12/2013   Anticipated DC Plan:  HOME W HOME HEALTH SERVICES      DC Planning Services  CM consult      [facility] Choice  HOME HEALTH   Choice offered to / List presented to:  C-4 Adult Children        HH arranged  IV Antibiotics      HH agency  [facility]   Status of service:  Completed, signed off Medicare Important Message given?   (If response is "NO", the following Medicare IM given date fields will be blank) Date Medicare IM given:   Date Additional Medicare IM given:    Discharge Disposition:  HOME W HOME HEALTH SERVICES  Per UR Regulation:    If discussed at Long Length of Stay Meetings, dates discussed:    Comments:  01/12/2013   [address] RN, [place]  [phone] [facility]/Kristen called to discuss home care services. The patient is active with HHRN, PT, OT. She will need hard copy of script for the IV medications.  01/12/2013  [address] RN, [place]  [phone] CM referral: home IV antibiotics  Met with daughter regarding choice for home health agency to provided IV antibiotic. She selected [facility] and requested additional bag of IVF in case needed. NCM called Dr Shirlee Latch with update. SW/Janet called with request for ambulance transport home.  [facility]/Kristen: called and left message with referral.

## 2013-01-12 NOTE — Plan of Care (Signed)
Problem: Phase III Progression Outcomes Goal: IV/normal saline lock discontinued Outcome: Not Applicable Date Met:  01/12/13 Pt being discharged with PICC line in place.

## 2013-01-12 NOTE — Progress Notes (Signed)
Telemetry discontinued for discharge. Box #21 returned to nurse's station.  Kathlene November, Amalya Salmons [place]

## 2013-01-12 NOTE — Consult Note (Signed)
WOC wound consult note Reason for Consult: Consult requested for sacrum wound.  Daughter at bedside states wound has been present since previous admission. She has home health assistance prior to admission and they have been applying a hydrocolloid to this site.  When she was admitted yesterday and daughter and bedside nurse assessed sacrum, area had declined; this might have been R/T laying on the ER stretcher for a prolonged amount of time.  Daughter assessed and states site has improved since yesterday. Pt is incontinent of stool and urine; cleaned large amt stool.  Daughter states her protein intake is poor. Wound type: Stage 2 to sacrum Pressure Ulcer POA: Yes Measurement:1.4X1.8X.1cm Wound bed: 90% red, 10% yellow Drainage (amount, consistency, odor) Small amt yellow drainage, no odor Periwound:Erythemia surrounding to 1 cm.  Dressing procedure/placement/frequency:Foam dressing to protect and promote healing.  Air mattress to decrease pressure.  Discussed pressure ulcer etiology, topical treatment, and preventive measures with daughter.  She is very well-informed and asks appropriate questions. Please re-consult if further assistance is needed.  Thank-you,  Cammie Mcgee MSN, RN, CWOCN, [place], CNS [phone]

## 2013-01-12 NOTE — Progress Notes (Signed)
Subjective: [name] is a 79 y.o. female w/ PMHx of CVA (L. MCA, 2009), COPD, HTN, CHF, HOCM, paroxysmal A-fib, symptomatic bradycardia w/ PPM, and known history of dysphagia (2/2 CVA), presents from home w/ worsening dysphagia.  Patient seen at bedside this AM. Examination unchanged. Still lethargic on exam, responsive to verbal stimuli. No overnight issues.  Objective: Vital signs in last 24 hours: Filed Vitals:   01/12/13 0336 01/12/13 0423 01/12/13 0847 01/12/13 0930  BP:    108/68  Pulse:  64  68  Temp:  97.1 F (36.2 C)    TempSrc:  Axillary    Resp:  18  18  Height:      Weight:      SpO2: 99% 100% 100% 99%   Weight change:   Intake/Output Summary (Last 24 hours) at 01/12/13 1235 Last data filed at 01/12/13 1109  Gross per 24 hour  Intake     50 ml  Output      0 ml  Net     50 ml   Physical Exam: General: Vital signs reviewed. Patient is a frail elderly female, in no acute distress and cooperative with exam. Alert and oriented x2. Difficult to understand 2/2 poor speech.  Neck: Supple, trachea midline, normal ROM, No JVD, masses, thyromegaly, or carotid bruit present.  Cardiovascular: RRR, S1 normal, S2 normal, no murmurs, gallops, or rubs.  Pulmonary/Chest: Normal respiratory effort, CTAB, no wheezes, rales, or rhonchi.  Abdominal: Soft, non-tender, non-distended, bowel sounds are normal, no masses, organomegaly, or guarding present.  Musculoskeletal: No joint deformities, erythema, or stiffness, ROM full and no nontender.  Extremities: Positive for 2+ LE edema. Pulses symmetric and intact bilaterally. No cyanosis or clubbing. PICC placed in LUE. Hematoma present over inner R. elbow. Small abscess (1.5 cm x 1.5 cm) on right shoulder, productive of minimal puss, slightly fluctuant. Neurological: A&O x2. Strength is minimal but symmetric bilaterally, cranial nerve II-XII are grossly intact, but difficult to assess completely as patient is lethargic.    Skin: Warm, dry and intact. No rashes or erythema.  Lab Results: Basic Metabolic Panel:  Recent Labs Lab 01/11/13 1256 01/11/13 1620 01/12/13 0545  NA 139  --  139  K 4.1  --  3.6  CL 100  --  101  CO2 35*  --  35*  GLUCOSE 79  --  87  BUN 17  --  17  CREATININE 0.56  --  0.51  CALCIUM 8.4  --  8.4  MG  --  2.1  --   PHOS  --  2.9  --    CBC:  Recent Labs Lab 01/11/13 1256  WBC 6.5  NEUTROABS 3.9  HGB 9.4*  HCT 30.4*  MCV 101.7*  PLT 108*   CBG:  Recent Labs Lab 01/11/13 1659 01/11/13 2051 01/11/13 2147 01/12/13 0435 01/12/13 0729 01/12/13 1136  GLUCAP 65* 68* 125* 78 83 83   Coagulation:  Recent Labs Lab 01/06/13 01/11/13 1340  LABPROT  --  18.0*  INR 1.3 1.53*   Studies/Results: Dg Chest 2 View  01/11/2013   CLINICAL DATA:  Dysphagia  EXAM: CHEST  2 VIEW  COMPARISON:  12/22/2012  FINDINGS: Persistent moderate pleural effusions with compressive basilar atelectasis/ consolidation. Difficult to exclude pneumonia. Heart is enlarged. Left subclavian pacer evident. Atherosclerosis of the aorta. Degenerative changes of the spine. Chronic mid thoracic compression fracture. Increased thoracic kyphosis noted.  IMPRESSION: Persistent cardiomegaly with bilateral pleural effusions and basilar atelectasis/consolidation. No significant  interval change.   Electronically Signed   By: Ruel Favors M.D.   On: 01/11/2013 13:36   Ct Head Wo Contrast  01/11/2013   CLINICAL DATA:  Difficulty swallowing since Friday, evaluate for CVA, history of prior CVA  EXAM: CT HEAD WITHOUT CONTRAST  TECHNIQUE: Contiguous axial images were obtained from the base of the skull through the vertex without intravenous contrast.  COMPARISON:  12/04/2012; 10/27/2007  FINDINGS: Grossly unchanged sequela of remote left-sided MCA distribution infarction with associated geographic encephalomalacia and a punctate area of dystrophic calcification within the prior infarct. Grossly unchanged findings of  atrophy with sulcal prominence. Grossly unchanged rather extensive periventricular hypodensities are grossly unchanged in compatible microvascular ischemic disease. Bilateral basal ganglia calcifications. Given extensive background parenchymal abnormalities, there is no CT evidence of acute large territory infarct. No intraparenchymal or extra-axial mass or hemorrhage. Unchanged size and configuration of the ventricles and basilar cisterns including mild ex vacuo dilatation of the left lateral ventricle. No midline shift. Intracranial atherosclerosis. Limited visualization there is an air-fluid level within the right maxillary sinus. The remaining paranasal sinuses and mastoid air cells are normally aerated. Post bilateral cataract surgery. Regional soft tissues are normal. No displaced calvarial fracture.  IMPRESSION: 1. No acute intracranial process. 2. Stable sequela of remote left-sided MCA distribution infarct. 3. Grossly stable findings of atrophy and advanced microvascular ischemic disease. 4. Air-fluid level within the right maxillary sinus, nonspecific though could be seen in the setting of acute sinusitis.   Electronically Signed   By: Simonne Come M.D.   On: 01/11/2013 18:41   Medications: I have reviewed the patient's current medications. Scheduled Meds: . enoxaparin (LOVENOX) injection  40 mg Subcutaneous Q24H  . feeding supplement (PRO-STAT SUGAR FREE 64)  30 mL Oral BID  . fluconazole (DIFLUCAN) IV  100 mg Intravenous Q24H  . levalbuterol  0.63 mg Nebulization Q6H  . sodium chloride  3 mL Intravenous Q12H  . sodium chloride  3 mL Intravenous Q12H   Continuous Infusions:  PRN Meds:.sodium chloride, levalbuterol, nitroGLYCERIN, sodium chloride, sodium chloride  Assessment/Plan: [name] is a 79 y.o. female w/ PMHx of CVA (L. MCA, 2009), COPD, HTN, CHF, HOCM, paroxysmal A-fib, symptomatic bradycardia w/ PPM, and known history of dysphagia (2/2 CVA), presents from home w/  worsening dysphagia.  Dysphagia- Patient w/ known history of dysphagia 2/2 CVA in 2009, however, significantly worse since 01/09/13. Concern for Aspiration with history of aspiration pneumonia. Previously considered PEG during last admission but received PICC instead. CXR was performed on admission, showed persistent cardiomegaly with bilateral pleural effusions and basilar atelectasis/consolidation, but with no significant interval change since previous admission. Seen by Dr. Elnoria Howard and discussed acute regurgitation with him at bedside. Felt that CT head was necessary to rule out any acute changes, but was -ve.  -Seen by GI this AM as well. Appreciate recommendations. Discussed w/ daughter at bedside this AM, patient has had oral candidiasis recently and received 1 dose of Diflucan 150 mg. Very possible esophageal candidiasis is responsible for recent dysphagia. Will treat w/ Diflucan 100 mg IV qd for 10 days as patient has a PICC line that can be utilized at home.  -Continue NPO for now.   Chronic combined heart failure -On Lasix and Spironolactone at home. Follows w/ Dr. Graciela Husbands.  -Will hold for diuretics for now.  -Will also discuss w/ cardiology in AM as daughter had requested to have PPM interrogated.   Paroxysmal AF on Coumadin -Holding Coumadin until SLP evaluation.  INR 1.53 on admission.   HTN- Controlled, monitor VS   DVT px -Lovenox   Dispo: Disposition is deferred at this time, awaiting improvement of current medical problems.  Anticipated discharge today.  The patient does have a current PCP (Provider Default, MD) and does not need an [facility] hospital follow-up appointment after discharge.  The patient does not have transportation limitations that hinder transportation to clinic appointments.  .Services Needed at time of discharge: Y = Yes, Blank = No PT:   OT:   RN:   Equipment:   Other:     LOS: 1 day   Courtney Paris, MD 01/12/2013, 12:35 PM Pager: [phone]

## 2013-01-12 NOTE — Progress Notes (Signed)
PT Cancellation Note  Patient Details Name: Dawn Barrett MRN: [id number] DOB: December 10, 1918   Cancelled Treatment:     Daughter, caregiver, present; refusing therapy at this time. States " i dont want her to get up and had to get back in the bed before she goes home". Discussed patient's mobility at home. Pt is totally dependent lift x 2 with caregivers at home. Family and home CNA present; no questions regarding transfer technique. No acute PT needs at this time. Will sign off. Encouraged nursing to use lift with pt if necessary to transfer.   Dawn Barrett, Dawn Barrett [phone] 01/12/2013, 12:58 PM

## 2013-01-15 NOTE — Discharge Summary (Signed)
  Date: 01/15/2013  Patient name: Dawn Barrett  Medical record number: [id number]  Date of birth: November 16, 1918   This patient has been seen and the plan of care was discussed with the house staff. Please see their note for complete details. I concur with their findings and plan.  Jonah Blue, DO, FACP Faculty [facility] 01/15/2013, 7:32 PM

## 2013-01-16 ENCOUNTER — Ambulatory Visit (INDEPENDENT_AMBULATORY_CARE_PROVIDER_SITE_OTHER): Payer: Medicare Other | Admitting: Pharmacist

## 2013-01-16 DIAGNOSIS — Z7901 Long term (current) use of anticoagulants: Secondary | ICD-10-CM

## 2013-01-19 ENCOUNTER — Telehealth: Payer: Self-pay | Admitting: *Deleted

## 2013-01-19 ENCOUNTER — Ambulatory Visit (INDEPENDENT_AMBULATORY_CARE_PROVIDER_SITE_OTHER): Payer: Medicare Other | Admitting: Cardiovascular Disease

## 2013-01-19 DIAGNOSIS — Z7901 Long term (current) use of anticoagulants: Secondary | ICD-10-CM

## 2013-01-19 LAB — PROTIME-INR: INR: 1.3 — AB (ref <=1.1)

## 2013-01-19 NOTE — Telephone Encounter (Signed)
[facility] calls and needs new orders from dr Yetta Barre for meds and PICC, dr Yetta Barre paged w/ ph#

## 2013-01-27 ENCOUNTER — Ambulatory Visit (INDEPENDENT_AMBULATORY_CARE_PROVIDER_SITE_OTHER): Payer: Medicare Other | Admitting: Cardiovascular Disease

## 2013-01-27 DIAGNOSIS — Z7901 Long term (current) use of anticoagulants: Secondary | ICD-10-CM

## 2013-01-27 LAB — POCT INR: INR: 2.9

## 2013-01-28 ENCOUNTER — Inpatient Hospital Stay (HOSPITAL_COMMUNITY)
Admission: EM | Admit: 2013-01-28 | Discharge: 2013-02-05 | DRG: 177 | Disposition: A | Discharge status: Home Health | Payer: Medicare Other | Attending: Internal Medicine | Admitting: Internal Medicine

## 2013-01-28 ENCOUNTER — Encounter (HOSPITAL_COMMUNITY): Payer: Self-pay | Admitting: Emergency Medicine

## 2013-01-28 ENCOUNTER — Emergency Department (HOSPITAL_COMMUNITY): Payer: Medicare Other

## 2013-01-28 DIAGNOSIS — S46909A Unspecified injury of unspecified muscle, fascia and tendon at shoulder and upper arm level, unspecified arm, initial encounter: Secondary | ICD-10-CM | POA: Diagnosis present

## 2013-01-28 DIAGNOSIS — R32 Unspecified urinary incontinence: Secondary | ICD-10-CM | POA: Diagnosis present

## 2013-01-28 DIAGNOSIS — Z950 Presence of cardiac pacemaker: Secondary | ICD-10-CM

## 2013-01-28 DIAGNOSIS — L8992 Pressure ulcer of unspecified site, stage 2: Secondary | ICD-10-CM | POA: Diagnosis present

## 2013-01-28 DIAGNOSIS — D649 Anemia, unspecified: Secondary | ICD-10-CM | POA: Diagnosis present

## 2013-01-28 DIAGNOSIS — Z7901 Long term (current) use of anticoagulants: Secondary | ICD-10-CM

## 2013-01-28 DIAGNOSIS — Z66 Do not resuscitate: Secondary | ICD-10-CM | POA: Diagnosis present

## 2013-01-28 DIAGNOSIS — I252 Old myocardial infarction: Secondary | ICD-10-CM

## 2013-01-28 DIAGNOSIS — E43 Unspecified severe protein-calorie malnutrition: Secondary | ICD-10-CM | POA: Diagnosis present

## 2013-01-28 DIAGNOSIS — J449 Chronic obstructive pulmonary disease, unspecified: Secondary | ICD-10-CM | POA: Diagnosis present

## 2013-01-28 DIAGNOSIS — R159 Full incontinence of feces: Secondary | ICD-10-CM | POA: Diagnosis present

## 2013-01-28 DIAGNOSIS — L89152 Pressure ulcer of sacral region, stage 2: Secondary | ICD-10-CM | POA: Diagnosis present

## 2013-01-28 DIAGNOSIS — R1310 Dysphagia, unspecified: Secondary | ICD-10-CM

## 2013-01-28 DIAGNOSIS — I4891 Unspecified atrial fibrillation: Secondary | ICD-10-CM | POA: Diagnosis present

## 2013-01-28 DIAGNOSIS — I421 Obstructive hypertrophic cardiomyopathy: Secondary | ICD-10-CM | POA: Diagnosis present

## 2013-01-28 DIAGNOSIS — Z889 Allergy status to unspecified drugs, medicaments and biological substances status: Secondary | ICD-10-CM

## 2013-01-28 DIAGNOSIS — B3781 Candidal esophagitis: Secondary | ICD-10-CM

## 2013-01-28 DIAGNOSIS — I509 Heart failure, unspecified: Secondary | ICD-10-CM

## 2013-01-28 DIAGNOSIS — I69991 Dysphagia following unspecified cerebrovascular disease: Secondary | ICD-10-CM

## 2013-01-28 DIAGNOSIS — E860 Dehydration: Secondary | ICD-10-CM | POA: Diagnosis present

## 2013-01-28 DIAGNOSIS — R627 Adult failure to thrive: Secondary | ICD-10-CM

## 2013-01-28 DIAGNOSIS — J4489 Other specified chronic obstructive pulmonary disease: Secondary | ICD-10-CM | POA: Diagnosis present

## 2013-01-28 DIAGNOSIS — F0390 Unspecified dementia without behavioral disturbance: Secondary | ICD-10-CM | POA: Diagnosis present

## 2013-01-28 DIAGNOSIS — I079 Rheumatic tricuspid valve disease, unspecified: Secondary | ICD-10-CM | POA: Diagnosis present

## 2013-01-28 DIAGNOSIS — S4980XA Other specified injuries of shoulder and upper arm, unspecified arm, initial encounter: Secondary | ICD-10-CM | POA: Diagnosis present

## 2013-01-28 DIAGNOSIS — X58XXXA Exposure to other specified factors, initial encounter: Secondary | ICD-10-CM

## 2013-01-28 DIAGNOSIS — I447 Left bundle-branch block, unspecified: Secondary | ICD-10-CM | POA: Diagnosis present

## 2013-01-28 DIAGNOSIS — I059 Rheumatic mitral valve disease, unspecified: Secondary | ICD-10-CM | POA: Diagnosis present

## 2013-01-28 DIAGNOSIS — R791 Abnormal coagulation profile: Secondary | ICD-10-CM | POA: Diagnosis present

## 2013-01-28 DIAGNOSIS — I10 Essential (primary) hypertension: Secondary | ICD-10-CM | POA: Diagnosis present

## 2013-01-28 DIAGNOSIS — I5042 Chronic combined systolic (congestive) and diastolic (congestive) heart failure: Secondary | ICD-10-CM | POA: Diagnosis present

## 2013-01-28 DIAGNOSIS — Z79899 Other long term (current) drug therapy: Secondary | ICD-10-CM

## 2013-01-28 DIAGNOSIS — Z8249 Family history of ischemic heart disease and other diseases of the circulatory system: Secondary | ICD-10-CM

## 2013-01-28 DIAGNOSIS — J690 Pneumonitis due to inhalation of food and vomit: Principal | ICD-10-CM | POA: Diagnosis present

## 2013-01-28 DIAGNOSIS — Z7401 Bed confinement status: Secondary | ICD-10-CM

## 2013-01-28 DIAGNOSIS — I498 Other specified cardiac arrhythmias: Secondary | ICD-10-CM | POA: Diagnosis present

## 2013-01-28 DIAGNOSIS — L89109 Pressure ulcer of unspecified part of back, unspecified stage: Secondary | ICD-10-CM | POA: Diagnosis present

## 2013-01-28 LAB — COMPREHENSIVE METABOLIC PANEL
ALT: 21 U/L (ref 0–35)
AST: 33 U/L (ref 0–37)
Albumin: 1.9 g/dL — ABNORMAL LOW (ref 3.5–5.2)
Albumin: 2 g/dL — ABNORMAL LOW (ref 3.5–5.2)
Alkaline Phosphatase: 191 U/L — ABNORMAL HIGH (ref 39–117)
Alkaline Phosphatase: 239 U/L — ABNORMAL HIGH (ref 39–117)
BUN: 22 mg/dL (ref 6–23)
BUN: 25 mg/dL — ABNORMAL HIGH (ref 6–23)
CO2: 31 mEq/L (ref 19–32)
Calcium: 8.4 mg/dL (ref 8.4–10.5)
Chloride: 102 mEq/L (ref 96–112)
Chloride: 101 mEq/L (ref 96–112)
Creatinine, Ser: 0.58 mg/dL (ref 0.50–1.10)
GFR calc non Af Amer: 78 mL/min — ABNORMAL LOW (ref >90)
Glucose, Bld: 124 mg/dL — ABNORMAL HIGH (ref 70–99)
Potassium: 4.3 mEq/L (ref 3.5–5.1)
Potassium: 4.7 mEq/L (ref 3.5–5.1)
Sodium: 139 mEq/L (ref 135–145)
Total Bilirubin: 0.6 mg/dL (ref 0.3–1.2)
Total Bilirubin: 0.7 mg/dL (ref 0.3–1.2)
Total Protein: 7 g/dL (ref 6.0–8.3)

## 2013-01-28 LAB — URINALYSIS, ROUTINE W REFLEX MICROSCOPIC
Glucose, UA: NEGATIVE mg/dL
Ketones, ur: 15 mg/dL — AB
Leukocytes, UA: NEGATIVE
Nitrite: NEGATIVE
Protein, ur: NEGATIVE mg/dL
Urobilinogen, UA: 1 mg/dL (ref 0.0–1.0)
pH: 5 (ref 5.0–8.0)

## 2013-01-28 LAB — CBC
HCT: 31.4 % — ABNORMAL LOW (ref 36.0–46.0)
MCHC: 32.2 g/dL (ref 30.0–36.0)
RBC: 3.24 MIL/uL — ABNORMAL LOW (ref 3.87–5.11)
RDW: 16.4 % — ABNORMAL HIGH (ref 11.5–15.5)
WBC: 10.3 10*3/uL (ref 4.0–10.5)

## 2013-01-28 LAB — PRO B NATRIURETIC PEPTIDE: Pro B Natriuretic peptide (BNP): 2180 pg/mL — ABNORMAL HIGH (ref 0–450)

## 2013-01-28 LAB — POCT I-STAT TROPONIN I

## 2013-01-28 MED ORDER — VITAMIN C 500 MG PO TABS
500.0000 mg | ORAL_TABLET | Freq: Every day | ORAL | Status: DC
  Filled 2013-01-28: qty 1

## 2013-01-28 MED ORDER — VANCOMYCIN HCL IN DEXTROSE 750-5 MG/150ML-% IV SOLN
750.0000 mg | INTRAVENOUS | Status: DC
  Administered 2013-01-28: 750 mg via INTRAVENOUS
  Filled 2013-01-28: qty 150

## 2013-01-28 MED ORDER — HEPARIN SODIUM (PORCINE) 5000 UNIT/ML IJ SOLN: 5000.0000 [IU] | Freq: Three times a day (TID) | INTRAMUSCULAR | Status: DC

## 2013-01-28 MED ORDER — CHLORHEXIDINE GLUCONATE 0.12 % MT SOLN
15.0000 mL | Freq: Two times a day (BID) | OROMUCOSAL | Status: DC
  Administered 2013-01-28 – 2013-02-01 (×8): 15 mL via OROMUCOSAL
  Filled 2013-01-28 (×10): qty 15

## 2013-01-28 MED ORDER — DEXTROSE-NACL 5-0.45 % IV SOLN
INTRAVENOUS | Status: DC
  Administered 2013-01-28: 20 mL/h via INTRAVENOUS
  Administered 2013-01-30: 21:00:00 via INTRAVENOUS

## 2013-01-28 MED ORDER — SODIUM CHLORIDE 0.9 % IJ SOLN
10.0000 mL | INTRAMUSCULAR | Status: DC | PRN
  Administered 2013-01-29: 10 mL
  Administered 2013-01-30: 20 mL
  Administered 2013-01-31 – 2013-02-05 (×3): 10 mL

## 2013-01-28 MED ORDER — DEXTROSE 5 % IV SOLN
1.0000 g | Freq: Once | INTRAVENOUS | Status: AC
  Administered 2013-01-28: 1 g via INTRAVENOUS
  Filled 2013-01-28: qty 1

## 2013-01-28 MED ORDER — LEVALBUTEROL HCL 0.63 MG/3ML IN NEBU
0.6300 mg | INHALATION_SOLUTION | RESPIRATORY_TRACT | Status: DC
  Administered 2013-01-28 – 2013-01-30 (×11): 0.63 mg via RESPIRATORY_TRACT
  Filled 2013-01-28 (×22): qty 3

## 2013-01-28 MED ORDER — BIOTENE DRY MOUTH MT LIQD
15.0000 mL | Freq: Two times a day (BID) | OROMUCOSAL | Status: DC
  Administered 2013-01-29 – 2013-02-04 (×13): 15 mL via OROMUCOSAL

## 2013-01-28 NOTE — ED Notes (Signed)
Pt arrived via EMS. Pt has hx of CHF, COPD. Thick and raspy per care givers. No intercostal movement per EMS. 2 L [place] (all the time). Pt is having difficulty with breathing.  128- BP 71- HR  99% 18- Rr Pt has a pacemaker. Pt has not eaten for the last day.

## 2013-01-28 NOTE — ED Notes (Signed)
Spoke with ED-PA about needing blood cultures before starting antibiotics. ED-PA stated she did not want blood cultures completed.

## 2013-01-28 NOTE — Progress Notes (Signed)
ANTIBIOTIC CONSULT NOTE - INITIAL  Pharmacy Consult for Vancomycin Indication: pneumonia  Allergies  Allergen Reactions  . Codeine Other (See Comments)    REACTION: unknown  . Levaquin [Levofloxacin In D5w]   . Sulfonamide Derivatives Other (See Comments)    REACTION:  unknown    Patient Measurements:   Wt Readings from Last 3 Encounters:  01/11/13 109 lb 2 oz (49.499 kg)  12/09/12 143 lb 4.8 oz (65 kg)  12/09/12 143 lb 4.8 oz (65 kg)      Vital Signs: Temp: 98.9 F (37.2 C) (12/03 1309) Temp src: Axillary (12/03 1309) BP: 134/68 mmHg (12/03 1530) Pulse Rate: 77 (12/03 1530) Intake/Output from previous day:   Intake/Output from this shift:    Labs:  Recent Labs  01/28/13 1250  WBC 10.3  HGB 10.1*  PLT 124*  CREATININE 0.51   The CrCl is unknown because both a height and weight (above a minimum accepted value) are required for this calculation. No results found for this basename: VANCOTROUGH, VANCOPEAK, VANCORANDOM, GENTTROUGH, GENTPEAK, GENTRANDOM, TOBRATROUGH, TOBRAPEAK, TOBRARND, AMIKACINPEAK, AMIKACINTROU, AMIKACIN,  in the last 72 hours   Microbiology: No results found for this or any previous visit (from the past 720 hour(s)).  Medical History: Past Medical History  Diagnosis Date  . Stroke     a. L MCA CVA 2009.  Marland Kitchen Hypertension   . COPD (chronic obstructive pulmonary disease)   . Paroxysmal atrial fibrillation   . NSTEMI (non-ST elevated myocardial infarction)     a. 10/2012 - managed medically.  . Chronic combined systolic and diastolic CHF (congestive heart failure)   . Symptomatic bradycardia     a. s/p St. Jude pacemaker 2009.  Marland Kitchen PNA (pneumonia)     a. Per notes, h/o pulm injury r/t PNA.  aspiration PNA 11/2012  . LBBB (left bundle branch block)   . HOCM (hypertrophic obstructive cardiomyopathy)     a. 2D Echo 10/2012: EF 40-45%, mild LVH, mild-mod TR, mitral SAM with mod MR, LVOT gradient not well assessed but at least 2.60m/s; findings c/w  HOCM.  Marland Kitchen Mitral regurgitation     a. Mod MR by echo 10/2012.  . Tricuspid regurgitation     a. Mild-mod TR by echo 10/2012.  Marland Kitchen Dysphagia     Medications:  Anti-infectives   Start     Dose/Rate Route Frequency Ordered Stop   01/28/13 1530  vancomycin (VANCOCIN) IVPB 750 mg/150 ml premix     750 mg 150 mL/hr over 60 Minutes Intravenous Every 24 hours 01/28/13 1455     01/28/13 1445  ceFEPIme (MAXIPIME) 1 g in dextrose 5 % 50 mL IVPB     1 g 100 mL/hr over 30 Minutes Intravenous  Once 01/28/13 1434       Assessment: 79 year old female admitted for respiratory distress.  She was recently treated for aspiration pneumonia, and is to receive antibiotic therapy with Vancomycin and Cefepime.  I estimate her CrCl is ~30 ml/min due to advanced age and low body weight.  Goal of Therapy:  Vancomycin trough level 15-20 mcg/ml  Plan:  Vancomycin 750mg  IV q24h Monitor renal function Follow any available micro data  Estella Husk, Pharm.D., BCPS, AAHIVP Clinical Pharmacist Phone [phone] or [phone] 01/28/2013, 4:01 PM

## 2013-01-28 NOTE — ED Notes (Signed)
Audible raspy and gurgling sound while patient is breathing. Pt on 2 L [place] all the time.

## 2013-01-28 NOTE — H&P (Signed)
Date: 01/28/2013               Patient Name:  Dawn Barrett MRN: [id number]  DOB: Oct 08, 1918 Age / Sex: 79 y.o., female   PCP: Provider Default, MD         Medical Service: Internal Medicine Teaching Service         Attending Physician: Dr. Aletta Edouard    First Contact: Dr. Vivi Barrack Pager: [phone]  Second Contact: Dr. Leonia Reeves Pager: [phone]       After Hours (After 5p/  First Contact Pager: [phone]  weekends / holidays): Second Contact Pager: [phone]   Chief Complaint: Dysphagia  History of Present Illness:  Dawn Barrett is a 79 y.o. female w/ PMHx of CVA (L. MCA, 2009), known history of dysphagia (2/2 CVA), dementia, COPD, HTN, CHF (EF 40-45% in 9/14) and HOCM, paroxysmal A-fib, symptomatic bradycardia w/ PPM, and recent admission for dysphasia in October, presents from home with worsening dysphagia and course breathing. She is accompanied by her two caretakers who provided the history.  According to her caretakers, Dawn Barrett typically eats pureed diet and has a large appetite on most days, but since Tuesday, she has been eating less and has increased difficulty swallowing foods completely w/ repeated regurgitation. Since this time she has been almost completely unable to take food or liquid by mouth. She developed increasing difficulty breathing and audible ronchi. Caretakers deny fevers, chills, vomiting, diarrhea, dark or tarry stools. Earlier today she went to a dermatology appointment for workup of a wound on her right shoulder, and the physician there suggested she go to the emergency department for further care.  In terms of mental status, at baseline she is able to give yes or no answers to questions and keeps her head upright. Since Tuesday she has been more slumped in bed and less responsive to questions, per caretakers. However they do think she is doing much better since initially presenting to the emergency department.  The patient has a  chronic history of aspiration. The patient is DO NOT RESUSCITATE per her records and report from her caretakers. Patient had a very similar admission on 01/11/2013 for acute on chronic dysphasia. GI was consulted and felt she had oral candidiasis. She was treated with Diflucan IV through a PICC line for 10 days. Patient was also previously admitted on 12/04/12 for acute on chronic respiratory failure most likely d/t aspiration pneumonia, acute encephalopathy, and dehydration. Seen by Dr. Juanda Chance at this time to discuss PEG vs PICC placement. PICC was placed, PEG was refused, and patent was discharged. Daughter refused palliative care consult during this admission.  In the ED she was started on vancomycin and cefepime.   Meds: Current Facility-Administered Medications  Medication Dose Route Frequency Provider Last Rate Last Dose  . levalbuterol (XOPENEX) nebulizer solution 0.63 mg  0.63 mg Nebulization Q4H Ky Barban, MD        Allergies: Allergies as of 01/28/2013 - Review Complete 01/28/2013  Allergen Reaction Noted  . Codeine Other (See Comments)   . Levaquin [levofloxacin in d5w]  01/28/2013  . Sulfonamide derivatives Other (See Comments)    Past Medical History  Diagnosis Date  . Stroke     a. L MCA CVA 2009.  Marland Kitchen Hypertension   . COPD (chronic obstructive pulmonary disease)   . Paroxysmal atrial fibrillation   . NSTEMI (non-ST elevated myocardial infarction)     a. 10/2012 - managed medically.  . Chronic combined systolic  and diastolic CHF (congestive heart failure)   . Symptomatic bradycardia     a. s/p St. Jude pacemaker 2009.  Marland Kitchen PNA (pneumonia)     a. Per notes, h/o pulm injury r/t PNA.  aspiration PNA 11/2012  . LBBB (left bundle branch block)   . HOCM (hypertrophic obstructive cardiomyopathy)     a. 2D Echo 10/2012: EF 40-45%, mild LVH, mild-mod TR, mitral SAM with mod MR, LVOT gradient not well assessed but at least 2.83m/s; findings c/w HOCM.  Marland Kitchen Mitral regurgitation       a. Mod MR by echo 10/2012.  . Tricuspid regurgitation     a. Mild-mod TR by echo 10/2012.  Marland Kitchen Dysphagia    Past Surgical History  Procedure Laterality Date  . Pacemaker insertion  11/19/2007    St. Jude Zephyr 5020 pulse generator, serial [id number]  . Femur surgery Right 06/2011    Rod inserted after a fall.    Family History  Problem Relation Age of Onset  . Heart disease Mother    History   Social History  . Marital Status: Widowed    Spouse Name: N/A    Number of Children: N/A  . Years of Education: N/A   Occupational History  . Retired    Social History Main Topics  . Smoking status: Never Smoker   . Smokeless tobacco: Not on file  . Alcohol Use: No  . Drug Use: No  . Sexual Activity: Not on file   Other Topics Concern  . Not on file   Social History Narrative   Pt lives with daughter in [place] but has been in [place] recently.    Review of Systems: Review of systems not obtained due to patient factors.  Physical Exam: Blood pressure 124/88, pulse 73, temperature 98.9 F (37.2 C), temperature source Axillary, resp. rate 23, SpO2 100.00%. Physical Exam  Constitutional: She appears cachectic.  Slumped forward in bed with pillow under chin. Not in distress.  HENT:  Head: Normocephalic and atraumatic.  Cardiovascular: Normal rate, regular rhythm and normal heart sounds.  Exam reveals no gallop and no friction rub.   No murmur heard. Pulmonary/Chest: No accessory muscle usage. She has rhonchi (Course, heard in all lung fields.). She has rales (In lower lung fields bilaterally).  Audible ronchorous breathing. PICC in place at LUE.  Abdominal: Soft. There is no tenderness.  Hypoactive bowel sounds  Musculoskeletal: She exhibits edema (BL lower extremities).  Neurological: She is alert.  Nods yes and no to some questions, gave a soft, indecipherable vocalization to "where are you"  Skin:        Lab results: Basic Metabolic Panel:  Recent  Labs  01/28/13 1250  NA 139  K 4.3  CL 102  CO2 30  GLUCOSE 97  BUN 22  CREATININE 0.51  CALCIUM 8.4   Liver Function Tests:  Recent Labs  01/28/13 1250  AST 33  ALT 21  ALKPHOS 191*  BILITOT 0.6  PROT 7.0  ALBUMIN 1.9*   CBC:  Recent Labs  01/28/13 1250  WBC 10.3  HGB 10.1*  HCT 31.4*  MCV 96.9  PLT 124*   BNP:  Recent Labs  01/28/13 1227  PROBNP 2180.0*   Coagulation:  Recent Labs  01/27/13  INR 2.9   Urinalysis:  Recent Labs  01/28/13 1444  COLORURINE AMBER*  LABSPEC 1.020  PHURINE 5.0  GLUCOSEU NEGATIVE  HGBUR NEGATIVE  BILIRUBINUR NEGATIVE  KETONESUR 15*  PROTEINUR NEGATIVE  UROBILINOGEN 1.0  NITRITE  NEGATIVE  LEUKOCYTESUR NEGATIVE    Imaging results:  Dg Chest 2 View  01/28/2013   CLINICAL DATA:  Respiratory distress.  EXAM: CHEST  2 VIEW  COMPARISON:  January 11, 2013.  FINDINGS: Stable cardiomegaly. Bilateral pleural effusions are again noted. Left-sided pacemaker is unchanged in position. Severe thoracic kyphosis is noted with stable compression deformity of mid thoracic vertebral body. Bilateral basilar opacities are noted concerning for atelectasis or other consolidation. No pneumothorax is noted.  IMPRESSION: Stable cardiomegaly and bilateral pleural effusions, with associated bilateral basilar atelectasis or consolidation.   Electronically Signed   By: Roque Lias M.D.   On: 01/28/2013 13:45    Assessment & Plan by Problem: Dawn Barrett is a 79 y.o. female w/ PMHx of CVA (L. MCA, 2009), known history of dysphagia (2/2 CVA), dementia, COPD, HTN, CHF (EF 40-45% in 9/14) and HOCM, paroxysmal A-fib, symptomatic bradycardia w/ PPM, and recent admission for aspiration pneumonia in October, presents with worsening dysphagia and course breathing x1 day.  #Failure to thrive - Patient with known dysphasia and multiple admissions for aspiration and aspiration pneumonia. She is cachectic with severe protein calorie malnutrition,  as evidenced by an albumin of 1.9. The patient has a PICC line, but the family did not want to pursue a PEG after her admission in 10/14 for acute on chronic respiratory failure. Apparently they refused palliative care consult at that time. - Admit to IMTS - D5 1/2NS @ 10-20 ml/hr  - Will need to have discussion with family about goals of care - Pallaitive care consult in the morning if the family is amenable  #Dysphagia - Patient w/ known history of dysphagia 2/2 CVA in 2009, worse since yesterday. Concern for aspiration with history of recurrent aspiration pneumonia. CXR performed on admission showed persistent cardiomegaly with bilateral pleural effusions and basilar atelectasis/consolidation, but with no significant interval change since previous admission. Saturating 99-100% on home 2L [place]. Afebrile and all vital signs stable. Per her caretakers, patient is now close to her baseline health and mental status. - NPO - SLP evaluate and treat - Continue home Xopenex nebs q4h - Strict bed rest - Routine vital signs - Oral care/suctioning as needed - Repeat CMP and CBC in am  #Chronic right shoulder wound - No signs or symptoms of infection. Per caretakers, the dermatologist she saw today was concerned that this represented a squamous cell carcinoma. - Wound care consult, will also have them evaluate stage 2 sacral ulcer which caretakers mentioned to [place] - Patient will need outpatient dermatology followup  #Chronic combined heart failure - On Lasix and Spironolactone at home. Follows w/ Dr. Graciela Husbands.  - Will hold for diuretics for now  #Paroxysmal atrial fibrillation on Coumadin - Currently in normal sinus rhythm. INR 2.9 on admission.  - Holding coumadin until SLP evaluation.  #HTN - Initially elevated into the 160s systolic, now normotensive. - Continue to monitor  #DVT px - Heparin subq  #Code status - DNR/DNI  Dispo: Disposition is deferred at this time, awaiting improvement of current  medical problems. Anticipated discharge in approximately 1-3 day(s).   The patient does have a current PCP [facility]) and does need an [facility] hospital follow-up appointment after discharge.  The patient does not have transportation limitations that hinder transportation to clinic appointments.  Signed: Vivi Barrack, MD 01/28/2013, 6:40 PM

## 2013-01-28 NOTE — Progress Notes (Signed)
Called to receive report from Leslie/LeAnn Banker) in ED.

## 2013-01-28 NOTE — ED Notes (Signed)
Pt is from home. Pt was here 3 weeks ago for aspiration pneumonia. EMS stated pt is not as alert as usual per family.  Right sided weakness

## 2013-01-28 NOTE — ED Notes (Signed)
Patient transported to X-ray 

## 2013-01-28 NOTE — Progress Notes (Signed)
Pt arrived to unit via ED stretcher accompanied by RN and caretakers from home. Pt is awake, not responsive. Pt sitting in bed. Audible rhonchi from doorway. Pt placed on 2L O2 (per caretakers home dose). IV infusing at [facility] (single lumen picc). Pt with multiple bruises/sores. MD aware of pts arrival to unit, awaiting orders. Called respiratory for breathing tx. Will continue to monitor.

## 2013-01-28 NOTE — ED Provider Notes (Signed)
CSN: [id number]     Arrival date & time 01/28/13  1212 History   First MD Initiated Contact with Patient 01/28/13 1216     Chief Complaint  Patient presents with  . Respiratory Distress   (Consider location/radiation/quality/duration/timing/severity/associated sxs/prior Treatment) The history is provided by a caregiver, medical records and a relative. No language interpreter was used.    Dawn Barrett Is a 79 year old female brought in by her caretakers for respiratory distress.  This patient with multiple comorbidities including COPD, history of end-stage, chronic CHF, history of aspiration pneumonia in October of this year, hypertrophic obstructive cardiomyopathy, dysphagia after her  CVA.  The patient is DO NOT RESUSCITATE per her records and report from her caretakers.  They state that the past 3 days the patient has refused to eat.  She has had difficulty swallowing now and is on a pured diet.  He states that over the weekend she would cough and choke on any foods she was eating.  She has had increasing difficulty breathing and loud  rhonchi.  Patient is on home oxygen at 2 L per minute.  She is also on long-term Coumadin.  Caretakers deny that she had fever, vomiting.  They deny any bloody or dark tarry colored stools. Review of last admissions shows that patient is at risk for chronic aspiration pneumonia and that daughter refused peg tube placement.   Past Medical History  Diagnosis Date  . Stroke     a. L MCA CVA 2009.  Marland Kitchen Hypertension   . COPD (chronic obstructive pulmonary disease)   . Paroxysmal atrial fibrillation   . NSTEMI (non-ST elevated myocardial infarction)     a. 10/2012 - managed medically.  . Chronic combined systolic and diastolic CHF (congestive heart failure)   . Symptomatic bradycardia     a. s/p St. Jude pacemaker 2009.  Marland Kitchen PNA (pneumonia)     a. Per notes, h/o pulm injury r/t PNA.  aspiration PNA 11/2012  . LBBB (left bundle branch block)   . HOCM  (hypertrophic obstructive cardiomyopathy)     a. 2D Echo 10/2012: EF 40-45%, mild LVH, mild-mod TR, mitral SAM with mod MR, LVOT gradient not well assessed but at least 2.15m/s; findings c/w HOCM.  Marland Kitchen Mitral regurgitation     a. Mod MR by echo 10/2012.  . Tricuspid regurgitation     a. Mild-mod TR by echo 10/2012.  Marland Kitchen Dysphagia    Past Surgical History  Procedure Laterality Date  . Pacemaker insertion  11/19/2007    St. Jude Zephyr 5020 pulse generator, serial [id number]  . Femur surgery Right 06/2011    Rod inserted after a fall.    Family History  Problem Relation Age of Onset  . Heart disease Mother    History  Substance Use Topics  . Smoking status: Never Smoker   . Smokeless tobacco: Not on file  . Alcohol Use: No   OB History   Grav Para Term Preterm Abortions TAB SAB Ect Mult Living                 Review of Systems  Unable to perform ROS  Allergies  Codeine; Levaquin; and Sulfonamide derivatives  Home Medications   Current Outpatient Rx  Name  Route  Sig  Dispense  Refill  . amiodarone (PACERONE) 100 MG tablet   Oral   Take 1 tablet (100 mg total) by mouth daily.   30 tablet   0   . cholecalciferol (VITAMIN D) 1000 UNITS  tablet   Oral   Take 2,000 Units by mouth daily.         . Cyanocobalamin (VITAMIN B-12 PO)   Oral   Take 1 tablet by mouth daily.         Marland Kitchen dextrose 5 % and 0.45% NaCl SOLN 1,000 mL      Infuse at a rate of 10-20 ml/hr.   1 L   0   . fluconazole (DIFLUCAN) 2 mg/mL SOLN IVPB   Intravenous   Inject 50 mLs (100 mg total) into the vein daily.   500 mL   0     Give 100 mg IV qd (50 mL) for total of 10 days.   . furosemide (LASIX) 20 MG tablet   Oral   Take 20 mg by mouth daily as needed for fluid.         Marland Kitchen levalbuterol (XOPENEX) 0.63 MG/3ML nebulizer solution   Nebulization   Take 3 mLs (0.63 mg total) by nebulization every 6 (six) hours as needed for wheezing.   3 mL   30     Please give 1 month supply   .  nitroGLYCERIN (NITROSTAT) 0.4 MG SL tablet   Sublingual   Place 1 tablet (0.4 mg total) under the tongue every 5 (five) minutes x 3 doses as needed for chest pain.   25 tablet   12   . Probiotic Product (PROBIOTIC PO)   Oral   Take 1 tablet by mouth daily.         Marland Kitchen spironolactone (ALDACTONE) 25 MG tablet   Oral   Take 25 mg by mouth daily.         . vitamin C (ASCORBIC ACID) 500 MG tablet   Oral   Take 500 mg by mouth daily.         Marland Kitchen warfarin (COUMADIN) 1 MG tablet   Oral   Take 0.5-1 mg by mouth daily. Take 1/2 tablet on Thursday and Sundays then take 1 tablet the other days          BP 110/79  Pulse 77  Temp(Src) 97.4 F (36.3 C) (Rectal)  Resp 18  SpO2 95% Physical Exam  Vitals reviewed. Constitutional: She appears distressed.  Frail, very thin elderly female in mild respiratory distress.  HENT:  Head: Normocephalic and atraumatic.  Eyes: Conjunctivae are normal. Pupils are equal, round, and reactive to light.  Neck: No JVD present.  Cardiovascular: Normal rate and regular rhythm.   BL , deep pitting edema  Pulmonary/Chest: She is in respiratory distress.  Raspy, gurgling Ronchi audible upon entry into the room.  Abdominal: Soft. She exhibits no distension.  Musculoskeletal: She exhibits edema.  Lymphadenopathy:    She has no cervical adenopathy.  Neurological:  Patient does not respond to voice, pain, or follow commands. She is sitting with eyes open.  Skin:  Large, erythematous, crusted and macrated lesions covered with tegaderm on R shoulder.    ED Course  Procedures (including critical care time) Labs Review Labs Reviewed  URINE CULTURE  CBC  COMPREHENSIVE METABOLIC PANEL  PRO B NATRIURETIC PEPTIDE  URINALYSIS, ROUTINE W REFLEX MICROSCOPIC   Imaging Review No results found.  EKG Interpretation    Date/Time:    Ventricular Rate:    PR Interval:    QRS Duration:   QT Interval:    QTC Calculation:   R Axis:     Text  Interpretation:  MDM   1. Aspiration pneumonia    Patient cxr shows BL  Pneumonia. ProBNP appears at baseline.mild anemia and wbc wnl. No sign of UTI., Negative troponin. Likely aspiration pneumonia/ Patient seen in shared visit with Dr. Jeraldine Loots who disgussed the case with patient's daughter, Rod Holler. Daughter asks for no CPR/Intubation but does want abx. Will admit with HCAP coverage considering her admission less than 90 days ago.     Arthor Captain, PA-C 02/01/13 [phone]

## 2013-01-29 ENCOUNTER — Telehealth: Payer: Self-pay | Admitting: Internal Medicine

## 2013-01-29 LAB — URINE CULTURE: Colony Count: >=100000

## 2013-01-29 LAB — CBC
HCT: 30.2 % — ABNORMAL LOW (ref 36.0–46.0)
Hemoglobin: 9.5 g/dL — ABNORMAL LOW (ref 12.0–15.0)
MCH: 30.9 pg (ref 26.0–34.0)
MCV: 98.4 fL (ref 78.0–100.0)
Platelets: 131 10*3/uL — ABNORMAL LOW (ref 150–400)
RBC: 3.07 MIL/uL — ABNORMAL LOW (ref 3.87–5.11)
RDW: 16.5 % — ABNORMAL HIGH (ref 11.5–15.5)
WBC: 11.1 10*3/uL — ABNORMAL HIGH (ref 4.0–10.5)

## 2013-01-29 MED ORDER — CLINDAMYCIN PHOSPHATE 600 MG/50ML IV SOLN
600.0000 mg | Freq: Three times a day (TID) | INTRAVENOUS | Status: DC
  Administered 2013-01-29 – 2013-02-01 (×10): 600 mg via INTRAVENOUS
  Filled 2013-01-29 (×15): qty 50

## 2013-01-29 MED ORDER — CLINDAMYCIN PHOSPHATE 600 MG/50ML IV SOLN
600.0000 mg | Freq: Three times a day (TID) | INTRAVENOUS | Status: DC
  Filled 2013-01-29 (×2): qty 50

## 2013-01-29 MED ORDER — BACITRACIN-NEOMYCIN-POLYMYXIN 400-5-5000 EX OINT
TOPICAL_OINTMENT | Freq: Every day | CUTANEOUS | Status: DC
  Administered 2013-01-29 – 2013-02-03 (×6): via TOPICAL
  Administered 2013-02-04: 1 via TOPICAL
  Administered 2013-02-05: 11:00:00 via TOPICAL
  Filled 2013-01-29 (×5): qty 1

## 2013-01-29 NOTE — Progress Notes (Signed)
INITIAL NUTRITION ASSESSMENT  DOCUMENTATION CODES Per approved criteria  -Not Applicable   INTERVENTION:  Diet advancement as able pending results of swallow evaluation.  If TF planned, please re-consult RD.  NUTRITION DIAGNOSIS: Inadequate oral intake related to decreased alertness and chronic dysphagia as evidenced by NPO status.   Goal: Intake to meet >90% of estimated nutrition needs.  Monitor:  Diet advancement, PO intake, labs, weight trend.  Reason for Assessment: MD Consult  79 y.o. female  Admitting Dx: Failure to thrive in adult  ASSESSMENT: Patient presented from home on 12/3 with worsening dysphagia and course breathing. She has a history of dysphagia; typically eats pureed diet and has a large appetite on most days, but since Tuesday, she has been eating less and has increased difficulty swallowing foods completely w/ repeated regurgitation. Since this time she has been almost completely unable to take food or liquid by mouth.  During hospital admission in October, patient's family did not want to pursue PEG placement or Palliative Care Team meeting at that time. Plans for Palliative Care meeting if family agreeable this admission. Remains NPO for now. SLP has been consulted for swallow evaluation. Wound care consult has also been ordered.   Per discussion with patient's sitter at bedside, patient with fluctuations in weight related to fluid status, swelling in her legs. She usually eats very well, but intake has been minimal for the past 2 days.  Nutrition Focused Physical Exam:  Subcutaneous Fat:  Orbital Region: mild-moderate depletion Upper Arm Region: WNL Thoracic and Lumbar Region: NA  Muscle:  Temple Region: severe depletion Clavicle Bone Region: severe depletion Clavicle and Acromion Bone Region: severe depletion Scapular Bone Region: NA Dorsal Hand: WNL Patellar Region: WNL Anterior Thigh Region: WNL Posterior Calf Region: WNL  Edema:  present  Height: Ht Readings from Last 1 Encounters:  01/28/13 5\' 5"  (1.651 m)    Weight: Wt Readings from Last 1 Encounters:  01/28/13 129 lb 3.2 oz (58.605 kg)    Ideal Body Weight: 56.8 kg  % Ideal Body Weight: 103%  Wt Readings from Last 10 Encounters:  01/28/13 129 lb 3.2 oz (58.605 kg)  01/11/13 109 lb 2 oz (49.499 kg)  12/09/12 143 lb 4.8 oz (65 kg)  12/09/12 143 lb 4.8 oz (65 kg)  11/26/12 130 lb (58.968 kg)  11/21/12 128 lb 15.5 oz (58.5 kg)  08/05/12 120 lb 12.8 oz (54.795 kg)  02/09/10 130 lb (58.968 kg)  06/28/09 134 lb (60.782 kg)  11/23/08 135 lb (61.236 kg)    Usual Body Weight: 130 lb 2 months ago  % Usual Body Weight: 99%  BMI:  Body mass index is 21.5 kg/(m^2).  Estimated Nutritional Needs: Kcal: 1300-1450 Protein: 75-90 gm Fluid: 1.3-1.5 L  Skin: stage II pressure ulcer on sacrum; chronic right shoulder wound; multiple blisters and skin tears  Diet Order: NPO  EDUCATION NEEDS: -No education needs identified at this time  No intake or output data in the 24 hours ending 01/29/13 0855  Last BM: 11/30   Labs:   Recent Labs Lab 01/28/13 1250 01/28/13 1900  NA 139 138  K 4.3 4.7  CL 102 101  CO2 30 31  BUN 22 25*  CREATININE 0.51 0.58  CALCIUM 8.4 8.6  GLUCOSE 97 124*    CBG (last 3)  No results found for this basename: GLUCAP,  in the last 72 hours  Scheduled Meds: . antiseptic oral rinse  15 mL Mouth Rinse q12n4p  . chlorhexidine  15 mL  Mouth Rinse BID  . levalbuterol  0.63 mg Nebulization Q4H    Continuous Infusions: . dextrose 5 % and 0.45% NaCl 20 mL/hr (01/28/13 1857)    Past Medical History  Diagnosis Date  . Stroke     a. L MCA CVA 2009.  Marland Kitchen Hypertension   . COPD (chronic obstructive pulmonary disease)   . Paroxysmal atrial fibrillation   . NSTEMI (non-ST elevated myocardial infarction)     a. 10/2012 - managed medically.  . Chronic combined systolic and diastolic CHF (congestive heart failure)   .  Symptomatic bradycardia     a. s/p St. Jude pacemaker 2009.  Marland Kitchen PNA (pneumonia)     a. Per notes, h/o pulm injury r/t PNA.  aspiration PNA 11/2012  . LBBB (left bundle branch block)   . HOCM (hypertrophic obstructive cardiomyopathy)     a. 2D Echo 10/2012: EF 40-45%, mild LVH, mild-mod TR, mitral SAM with mod MR, LVOT gradient not well assessed but at least 2.73m/s; findings c/w HOCM.  Marland Kitchen Mitral regurgitation     a. Mod MR by echo 10/2012.  . Tricuspid regurgitation     a. Mild-mod TR by echo 10/2012.  Marland Kitchen Dysphagia     Past Surgical History  Procedure Laterality Date  . Pacemaker insertion  11/19/2007    St. Jude Zephyr 5020 pulse generator, serial [id number]  . Femur surgery Right 06/2011    Rod inserted after a fall.     Joaquin Courts, RD, LDN, CNSC Pager [phone] After Hours Pager [phone]

## 2013-01-29 NOTE — Telephone Encounter (Signed)
New Message  Care giver--- Maxine Glenn called asking if the patient will need another pacemaker check being that she was recently seen in the hospital and it was checked at [facility] on 11/17th/// if the check is not on file, can she have the pacemaker check completed while she is currently admitted. Please call back to discuss.

## 2013-01-29 NOTE — Progress Notes (Signed)
SLP Cancellation Note  Patient Details Name: Dawn Barrett MRN: [id number] DOB: 12/09/1918   Cancelled treatment:         Orders received for BSE on pt with longstanding and significant dysphagia.  SLP consulted with RN and caregivers. Pt is currently poorly responsive, and is having difficulty managing secretions, with obvious wet breath sounds and weak ineffective cough. Past swallow evaluations have recommended NPO status due to severity of dysphagia, however, PEG has been declined.  No family present at this time.  Will follow briefly to complete education with family.  It is anticipated that repeat BSE/MBS would yield no different results/recommendations of NPO status due to high aspiration risk.  Desirae Mancusi B. Murvin [facility], CCC-SLP [phone]  Leigh Aurora 01/29/2013, 12:01 PM

## 2013-01-29 NOTE — Progress Notes (Addendum)
Subjective: Patient seen and examined at the bedside. She has deteriorated somewhat overnight. She is less responsive to me and her breathing is wet and more labored. Caretaker in the room agrees she appears worse. Weak, ineffective cough.  Spoke with daughter Rod Holler on the phone. Apparently the patient was in hospice previously, but the family did not like that "they pumped her up with morphine" and withdrew. Daughter is amenable to a palliative care consult to discuss goals of care given recurrent aspiration events, but of note she cannot come into the hospital because she is recovering from a recent surgery. Her phone # is [phone].  Objective: Vital signs in last 24 hours: Filed Vitals:   01/28/13 1800 01/28/13 1834 01/28/13 2156 01/29/13 0537  BP: 124/88 189/81 162/83 155/69  Pulse: 73 74 73 72  Temp:  97.7 F (36.5 C) 96.7 F (35.9 C) 97.6 F (36.4 C)  TempSrc:  Axillary Axillary Axillary  Resp: 23 22 22 21   Height:  5\' 5"  (1.651 m)    Weight:  129 lb 3.2 oz (58.605 kg)    SpO2: 100% 98% 97% 98%   Weight change:  No intake or output data in the 24 hours ending 01/29/13 1251  Physical Exam:  Constitutional: She appears cachectic.  Slumped forward in bed with pillow under chin. Breathing labored. HENT:  Head: Normocephalic and atraumatic.  Cardiovascular: Normal rate, regular rhythm and normal heart sounds. Exam reveals no gallop and no friction rub.  No murmur heard.  Pulmonary/Chest: No accessory muscle usage. She has rhonchi (Course, heard in all lung fields.). She has rales (In lower lung fields bilaterally).  Audible ronchorous breathing. PICC in place at LUE.  Abdominal: Soft. There is no tenderness.  Musculoskeletal: She exhibits edema (BL lower extremities).  Neurological: She is less responsive than yesterday. Not vocalizing answers. Skin:      Lab Results: Basic Metabolic Panel:  Recent Labs Lab 01/28/13 1250 01/28/13 1900  NA 139 138  K 4.3 4.7    CL 102 101  CO2 30 31  GLUCOSE 97 124*  BUN 22 25*  CREATININE 0.51 0.58  CALCIUM 8.4 8.6   Liver Function Tests:  Recent Labs Lab 01/28/13 1250 01/28/13 1900  AST 33 45*  ALT 21 22  ALKPHOS 191* 239*  BILITOT 0.6 0.7  PROT 7.0 7.5  ALBUMIN 1.9* 2.0*   CBC:  Recent Labs Lab 01/28/13 1250 01/29/13 0500  WBC 10.3 11.1*  HGB 10.1* 9.5*  HCT 31.4* 30.2*  MCV 96.9 98.4  PLT 124* 131*   BNP:  Recent Labs Lab 01/28/13 1227  PROBNP 2180.0*   Coagulation:  Recent Labs Lab 01/27/13  INR 2.9   Urinalysis:  Recent Labs Lab 01/28/13 1444  COLORURINE AMBER*  LABSPEC 1.020  PHURINE 5.0  GLUCOSEU NEGATIVE  HGBUR NEGATIVE  BILIRUBINUR NEGATIVE  KETONESUR 15*  PROTEINUR NEGATIVE  UROBILINOGEN 1.0  NITRITE NEGATIVE  LEUKOCYTESUR NEGATIVE    Micro Results: Recent Results (from the past 240 hour(s))  URINE CULTURE     Status: None   Collection Time    01/28/13  2:44 PM      Result Value Range Status   Specimen Description URINE, RANDOM   Final   Special Requests NONE   Final   Culture  Setup Time     Final   Value: 01/28/2013 15:30     Performed at [facility] Count     Final   Value: >=100,000 COLONIES/ML  Performed at [facility]     Final   Value: Multiple bacterial morphotypes present, none predominant. Suggest appropriate recollection if clinically indicated.     Performed at [facility]   Report Status 01/29/2013 FINAL   Final   Studies/Results: Dg Chest 2 View  01/28/2013   CLINICAL DATA:  Respiratory distress.  EXAM: CHEST  2 VIEW  COMPARISON:  January 11, 2013.  FINDINGS: Stable cardiomegaly. Bilateral pleural effusions are again noted. Left-sided pacemaker is unchanged in position. Severe thoracic kyphosis is noted with stable compression deformity of mid thoracic vertebral body. Bilateral basilar opacities are noted concerning for atelectasis or other consolidation. No pneumothorax is noted.   IMPRESSION: Stable cardiomegaly and bilateral pleural effusions, with associated bilateral basilar atelectasis or consolidation.   Electronically Signed   By: Roque Lias M.D.   On: 01/28/2013 13:45   Medications: I have reviewed the patient's current medications. Scheduled Meds: . antiseptic oral rinse  15 mL Mouth Rinse q12n4p  . chlorhexidine  15 mL Mouth Rinse BID  . clindamycin (CLEOCIN) IV  600 mg Intravenous Q8H  . levalbuterol  0.63 mg Nebulization Q4H  . neomycin-bacitracin-polymyxin   Topical Daily   Continuous Infusions: . dextrose 5 % and 0.45% NaCl 20 mL/hr (01/28/13 1857)   PRN Meds:.sodium chloride Assessment/Plan: [name] is a 79 y.o. female w/ PMHx of CVA (L. MCA, 2009), known history of dysphagia (2/2 CVA), dementia, COPD, HTN, CHF (EF 40-45% in 9/14) and HOCM, paroxysmal A-fib, symptomatic bradycardia w/ PPM, and recent admission for aspiration pneumonia in October, presents with worsening dysphagia and course breathing x1 day.   #Failure to thrive - Patient with known dysphasia and multiple admissions for aspiration and aspiration pneumonia. She is cachectic with severe protein calorie malnutrition, as evidenced by an albumin of 1.9. The patient has a PICC line, but the family did not want to pursue a PEG after her admission in 10/14 for acute on chronic respiratory failure. Apparently they refused palliative care consult at that time.  - D5 1/2NS @ 10-20 ml/hr  - Will need to have discussion with family about goals of care > As above family is open to palliative care discussion, they had a bad experience with hospice in the past, consult has been placed - Nutrition consult > Recommend diet advancement as able pending SLP (who rec NPO), will re-consult if we pursue TF - CBC, BMP, mag, phos in am  #Severe dysphagia, now with aspiration pneumonia - Patient w/ known history of dysphagia 2/2 CVA in 2009, worse since yesterday. Concern for aspiration with  history of recurrent aspiration pneumonia. CXR performed on admission showed persistent cardiomegaly with bilateral pleural effusions and basilar atelectasis/consolidation, but with no significant interval change since previous admission. This morning she appears clinically worse with course, wet breathing and weak cough. Still saturating well on home 2L [place]. Afebrile and all vital signs stable. Mild leukocytosis of 11.7 in this elderly patient.  - Starting Clindamycin 600mg  IV q8h for presumed aspiration pneumonia - NPO  - SLP evaluate and treat > Recommend deep suction, past swallow evaluations have recommended NPO status due to severity of dysphagia, anticipate repeat BSE/MBS would yield no different results/recommendations of NPO status due to high aspiration risk,  - Continue home Xopenex nebs q4h  - Strict bed rest  - Routine vital signs  - Oral care/ deep suctioning  #Chronic right shoulder wound - No signs or symptoms of infection. Per caretakers, the  dermatologist she saw today was concerned that this represented a squamous cell carcinoma.  - Wound care consult > Foam dressings to protect and promote healing - Patient will need outpatient dermatology followup   #Stage 2 sacral ulcer - Evaluated by wound care who are familiar with the patient and note improvement in appearance of this ulcer. - Wound care consult > Foam dressing to protect and absorb drainage  #Chronic combined heart failure - On Lasix and Spironolactone at home. Follows w/ Dr. Graciela Husbands.  - Will hold for diuretics for now   #Paroxysmal atrial fibrillation on Coumadin - Currently in normal sinus rhythm. INR 2.9 on admission.  - Holding coumadin given NPO - INR in am  #HTN - Currently 155/69. There is general agreement supporting a blood pressure goal of about 150/90 mmHg or less among adults 80 years and older with isolated systolic HTN and she is very close to this. - Continue to monitor  #DVT px - Supra therapeutic on  coumadin, will add AC based on am INR  #Code status - DNR/DNI   Dispo: Disposition is deferred at this time, awaiting improvement of current medical problems.  Anticipated discharge in approximately 1-3 day(s).   The patient does have a current PCP (Provider Default, MD) and does need an [facility] hospital follow-up appointment after discharge.  The patient does not have transportation limitations that hinder transportation to clinic appointments.  .Services Needed at time of discharge: Y = Yes, Blank = No PT:   OT:   RN:   Equipment:   Other:     LOS: 1 day   Vivi Barrack, MD 01/29/2013, 12:51 PM

## 2013-01-29 NOTE — H&P (Signed)
    Date: 01/29/2013      Patient name: Dawn Barrett  Medical record number: [id number]  Date of birth: [birth year]  Assessment: 79 year old female with dementia, dysphagia since 2009, secondary to CVA, coming in with altered mental status, somnolence and not able to eat food - likely secondary to aspiration.   Exam today:Her caretaker gave me most of the history. The patient is usually perked up, and eats pureed diet, per caretaker. On exam, the patient was somnolent, arousable but drowsy, wearing oxygen, making gurgling sounds, slumped in bed. She does not respond to verbal cues, but moans and opens eyes briefly on pinching lightly. She is cachectic. She is not in distress, lung exam has rhonchi, and bibasilar rales. She has lower extremity edema with multiple bruises. She has a wound on the right shoulder (protruding reddish wound, no swelling, no surrounding erythema, no discharge).    Plan  Aspiration pneumonia - Given the stuporous condition, the increased oxygen demand, constant dysphagia and microaspirations, leucocytosis, I will start this patient on clindamycin IV.   Cachexia - Nutrition consult. It is understood that the family does not want a PEG tube, however, the patient should be assessed for parenteral nutrition if required. She is NPO for now.   Addressing goals of care - might be a good option for the patient, to keep her more comfortable, however the family did not have a good experience with palliative medicine in the past. They are willing to have a meeting again regarding the goals of care for the patient.   Afib - agree with holding coumadin for now. Reassess starting it back based on the swallow status and goals of care for the patient.  Case reviewed with IM resident team; rest of the plan per resident note,   Dalphine Handing, [facility] 01/29/2013, 3:15 PM.

## 2013-01-29 NOTE — Consult Note (Addendum)
WOC wound consult note Reason for Consult: Consult requested for sacrum and right shoulder.  Pt familiar to [facility] service from previous admission when she had a chronic sacral pressure ulcer assessed on 11/16.  Now has a new lesion on the right shoulder which was assessed by the dermatologist yesterday, but pt was too ill to complete the appointment so full assessment was not completed by that team. Difficult to promote healing R/T frequent urine and stool incontinence, decreased albumin, and immobility. Wound type: Sacrum with stage 2 wound, appearance improved from previous admission.  Open area .5X.5X.2cm, 100% red, no odor, small amt yellow drainage.   Pressure Ulcer POA: Yes Plan: Foam dressing to protect and absorb drainage.  Right shoulder with full thickness lesion which extends above skin level.  100% red and moist, irregular shape, mod yellow drainage.  3X3cm.  Private duty caregiver at bedside states she went with the patient to the dermatologist yesterday and they think this might be a squamous cell lesion.  Appearance consistent with this diagnosis but biopsy was not performed yet. There is no dermatology service available as inpatient at [facility].  Recommend follow-up with this service after discharge.  Continue present plan of care as recommended during the appointment with neosporin and non-adherent dressing.  Bilat legs with generalized edema, multiple areas of skin tears and bruises.  Skin very thin and fragile.  Mod amt yellow drainage leaking from scattered areas of partial thickness wounds.  Albumin 1.9.   Plan: Foam dressings to protect and promote healing.  No family members present in room to discuss plan of care.  Please re-consult if further assistance is needed.  Thank-you,  Cammie Mcgee MSN, RN, CWOCN, [place], CNS [phone]

## 2013-01-30 ENCOUNTER — Inpatient Hospital Stay (HOSPITAL_COMMUNITY): Payer: Medicare Other

## 2013-01-30 LAB — PROTIME-INR
INR: 7.21 (ref 0.00–1.49)
Prothrombin Time: 61.4 seconds — ABNORMAL HIGH (ref 11.6–15.2)
Prothrombin Time: 58.7 seconds — ABNORMAL HIGH (ref 11.6–15.2)

## 2013-01-30 LAB — BASIC METABOLIC PANEL
BUN: 24 mg/dL — ABNORMAL HIGH (ref 6–23)
CO2: 34 mEq/L — ABNORMAL HIGH (ref 19–32)
Calcium: 8.1 mg/dL — ABNORMAL LOW (ref 8.4–10.5)
Creatinine, Ser: 0.55 mg/dL (ref 0.50–1.10)
GFR calc non Af Amer: 79 mL/min — ABNORMAL LOW (ref >90)
Glucose, Bld: 89 mg/dL (ref 70–99)
Potassium: 3.6 mEq/L (ref 3.5–5.1)

## 2013-01-30 LAB — HEPATIC FUNCTION PANEL
AST: 33 U/L (ref 0–37)
Bilirubin, Direct: 0.3 mg/dL (ref 0.0–0.3)
Indirect Bilirubin: 0.2 mg/dL — ABNORMAL LOW (ref 0.3–0.9)
Total Bilirubin: 0.5 mg/dL (ref 0.3–1.2)

## 2013-01-30 LAB — CBC WITH DIFFERENTIAL/PLATELET
Basophils Relative: 0 % (ref 0–1)
Eosinophils Absolute: 0 10*3/uL (ref 0.0–0.7)
Eosinophils Relative: 0 % (ref 0–5)
HCT: 26.1 % — ABNORMAL LOW (ref 36.0–46.0)
Hemoglobin: 8.4 g/dL — ABNORMAL LOW (ref 12.0–15.0)
Lymphs Abs: 1.6 10*3/uL (ref 0.7–4.0)
MCH: 31.6 pg (ref 26.0–34.0)
MCHC: 32.2 g/dL (ref 30.0–36.0)
MCV: 98.1 fL (ref 78.0–100.0)
Monocytes Absolute: 0.4 10*3/uL (ref 0.1–1.0)
Monocytes Relative: 6 % (ref 3–12)
Neutro Abs: 4.6 10*3/uL (ref 1.7–7.7)
RDW: 17.1 % — ABNORMAL HIGH (ref 11.5–15.5)

## 2013-01-30 MED ORDER — LEVALBUTEROL HCL 0.63 MG/3ML IN NEBU: 0.6300 mg | INHALATION_SOLUTION | RESPIRATORY_TRACT | Status: DC | PRN

## 2013-01-30 MED ORDER — LEVALBUTEROL HCL 0.63 MG/3ML IN NEBU
0.6300 mg | INHALATION_SOLUTION | Freq: Four times a day (QID) | RESPIRATORY_TRACT | Status: DC
  Administered 2013-01-30 – 2013-02-02 (×11): 0.63 mg via RESPIRATORY_TRACT
  Filled 2013-01-30 (×20): qty 3

## 2013-01-30 MED ORDER — VITAMIN K1 10 MG/ML IJ SOLN: 10.0000 mg | Freq: Once | INTRAVENOUS | Status: DC

## 2013-01-30 MED ORDER — FUROSEMIDE 10 MG/ML IJ SOLN
5.0000 mg | Freq: Every day | INTRAMUSCULAR | Status: DC
  Administered 2013-01-30 – 2013-02-01 (×3): 5 mg via INTRAVENOUS
  Filled 2013-01-30 (×3): qty 0.5

## 2013-01-30 MED ORDER — VITAMIN K1 10 MG/ML IJ SOLN
2.5000 mg | Freq: Once | INTRAVENOUS | Status: AC
  Administered 2013-01-30: 2.5 mg via INTRAVENOUS
  Filled 2013-01-30 (×2): qty 0.25

## 2013-01-30 NOTE — Progress Notes (Signed)
Chaplain made a visit to see the patient. Patient being seen by medical staff. Chaplain will follow up.   01/30/13 1500  Clinical Encounter Type  Visited With Health care provider  Visit Type Initial

## 2013-01-30 NOTE — Telephone Encounter (Signed)
Will forward to device clinic  

## 2013-01-30 NOTE — Progress Notes (Signed)
CRITICAL VALUE ALERT  Critical value received:  INR  Date of notification:  01/30/2013  Time of notification:  0802  Critical value read back:yes  Nurse who received alert:  Lovie Macadamia RN  MD notified (1st page):  Dr Claudell Kyle  Time of first page:  [phone]  MD notified (2nd page):  Time of second page:  Responding MD:  Dr Claudell Kyle  Time MD responded:  [phone]

## 2013-01-30 NOTE — Progress Notes (Signed)
    Date: 01/30/2013      Patient name: Dawn Barrett  Medical record number: [id number]  Date of birth: [birth year]  Assessment: 79 year old lady with chronic dysphagia and aspiration pneumonia.  Other issues: INR today > 7, dementia.  Pertinent Exam today: looks clinically improved, speaking, opening eyes, no gurgling sounds while breathing. Lung exam has lesser rales and rhonchi. She denies pain, and the abdomen is mostly benign exam (she holds my hand and limits the exam). Sacral wound stage 2 - looks non-infectious, some serous discharge, mostly dry.   Plan  Aspiration pneumonia - We continue with IV clindamycin, deep suctioning. Repeat CXR for follow up.  Nutrition - Hypoalbunemia - Swallow evaluation could not be done yesterday because she was drowsy, we will call them again today when the patient is more awake.  Supratheraputic INR - Likely nutritional deficiency. Vitamin K 2.5 mg IV given.   Palliative care discussion - tomorrow with daughter and palliative care team.  Sacral wound stage 2 - non-infectious. Wound care following.  Case reviewed with IM resident team; rest of the plan per resident note,   Brison Fiumara, [facility] 01/30/2013, 1:32 PM.

## 2013-01-30 NOTE — Progress Notes (Signed)
CRITICAL VALUE ALERT  Critical value received:  INR  Date of notification:  01/30/2013  Time of notification:  1144  Critical value read back:yes  Nurse who received alert:  Lovie Macadamia RN  MD notified (1st page):  Dr Claudell Kyle  Time of first page:  [phone]  MD notified (2nd page):  Time of second page:  Responding MD:  Dr Claudell Kyle  Time MD responded:  1200

## 2013-01-30 NOTE — Progress Notes (Signed)
Speech Language Pathology:    Patient Details Name: Dawn Barrett MRN: [id number] DOB: January 22, 1919 Today's Date: 01/30/2013  SLP recommends that patient continue with NPO status, secondary to long h/o significant dysphagia, with no expectation of improvement, or change in NPO recommendation with repeated BSE or MBSS. Noted that Palliative Care meeting is planned for tomorrow with patient's daughter and Palliative Care team. If patient and daughter wish to pursue PO's, will need to ensure that they have been educated, understand, and accept the risks of aspiration pneumonia from PO intake.   Angela Nevin, MA, CCC-SLP [facility] Speech-Language Pathologist

## 2013-01-30 NOTE — Progress Notes (Signed)
Subjective: Patient seen and examined at the bedside. She appears clinically improved. Breathing more comfortably. Responding to questions with eye contact and yes/no answers. Nurse and caretaker report no bleeding.  Spoke with daughter Dawn Barrett on the phone and gave her an update. She say her mother often has an increased INR after starting antibiotics. In the past, providers have given her some vitamin K with good response.  Objective: Vital signs in last 24 hours: Filed Vitals:   01/30/13 0127 01/30/13 0351 01/30/13 0525 01/30/13 1017  BP:   123/67 142/63  Pulse:  66 65 68  Temp:   97.6 F (36.4 C) 97.4 F (36.3 C)  TempSrc:   Axillary Axillary  Resp:  20 21 20   Height:      Weight:      SpO2: 96% 96% 96% 100%   Weight change: 1 lb 4.8 oz (0.59 kg)  Intake/Output Summary (Last 24 hours) at 01/30/13 1214 Last data filed at 01/30/13 0433  Gross per 24 hour  Intake    260 ml  Output      0 ml  Net    260 ml    Physical Exam:  Constitutional: She appears cachectic. No distress. HENT:  Head: Normocephalic and atraumatic.  Cardiovascular: Normal rate, regular rhythm and normal heart sounds. Exam reveals no gallop and no friction rub.  No murmur heard.  Pulmonary/Chest: No accessory muscle usage. She has improved rhonchi. She has improved rales. PICC in place at LUE.  Abdominal: Soft. There is no tenderness.  Musculoskeletal: She exhibits edema (BL lower extremities). It appears worse in lower abdomen, pelvis, genital area. Neurological: More responsive, answer yes and no. Skin:    Stage 2 sacral ulcer clean, dry, intact.  Lab Results: Basic Metabolic Panel:  Recent Labs Lab 01/28/13 1900 01/30/13 0600  NA 138 141  K 4.7 3.6  CL 101 104  CO2 31 34*  GLUCOSE 124* 89  BUN 25* 24*  CREATININE 0.58 0.55  CALCIUM 8.6 8.1*  MG  --  2.1  PHOS  --  3.1   Liver Function Tests:  Recent Labs Lab 01/28/13 1900 01/30/13 1110  AST 45* 33  ALT 22 21  ALKPHOS 239*  203*  BILITOT 0.7 0.5  PROT 7.5 6.0  ALBUMIN 2.0* 1.6*   CBC:  Recent Labs Lab 01/29/13 0500 01/30/13 0600  WBC 11.1* 6.7  NEUTROABS  --  4.6  HGB 9.5* 8.4*  HCT 30.2* 26.1*  MCV 98.4 98.1  PLT 131* 122*   BNP:  Recent Labs Lab 01/28/13 1227  PROBNP 2180.0*   Coagulation:  Recent Labs Lab 01/27/13 01/30/13 0600 01/30/13 1110  LABPROT  --  61.4* 58.7*  INR 2.9 7.63* 7.21*   Urinalysis:  Recent Labs Lab 01/28/13 1444  COLORURINE AMBER*  LABSPEC 1.020  PHURINE 5.0  GLUCOSEU NEGATIVE  HGBUR NEGATIVE  BILIRUBINUR NEGATIVE  KETONESUR 15*  PROTEINUR NEGATIVE  UROBILINOGEN 1.0  NITRITE NEGATIVE  LEUKOCYTESUR NEGATIVE    Micro Results: Recent Results (from the past 240 hour(s))  URINE CULTURE     Status: None   Collection Time    01/28/13  2:44 PM      Result Value Range Status   Specimen Description URINE, RANDOM   Final   Special Requests NONE   Final   Culture  Setup Time     Final   Value: 01/28/2013 15:30     Performed at [facility] Count     Final  Value: >=100,000 COLONIES/ML     Performed at [facility]   Culture     Final   Value: Multiple bacterial morphotypes present, none predominant. Suggest appropriate recollection if clinically indicated.     Performed at [facility]   Report Status 01/29/2013 FINAL   Final   Studies/Results: Dg Chest 2 View  01/28/2013   CLINICAL DATA:  Respiratory distress.  EXAM: CHEST  2 VIEW  COMPARISON:  January 11, 2013.  FINDINGS: Stable cardiomegaly. Bilateral pleural effusions are again noted. Left-sided pacemaker is unchanged in position. Severe thoracic kyphosis is noted with stable compression deformity of mid thoracic vertebral body. Bilateral basilar opacities are noted concerning for atelectasis or other consolidation. No pneumothorax is noted.  IMPRESSION: Stable cardiomegaly and bilateral pleural effusions, with associated bilateral basilar atelectasis or  consolidation.   Electronically Signed   By: Roque Lias M.D.   On: 01/28/2013 13:45   Medications: I have reviewed the patient's current medications. Scheduled Meds: . antiseptic oral rinse  15 mL Mouth Rinse q12n4p  . chlorhexidine  15 mL Mouth Rinse BID  . clindamycin (CLEOCIN) IV  600 mg Intravenous Q8H  . furosemide  5 mg Intravenous Daily  . levalbuterol  0.63 mg Nebulization Q4H  . neomycin-bacitracin-polymyxin   Topical Daily  . phytonadione (VITAMIN K) IV  2.5 mg Intravenous Once   Continuous Infusions: . dextrose 5 % and 0.45% NaCl 20 mL/hr at 01/30/13 0300   PRN Meds:.sodium chloride Assessment/Plan: Dawn Barrett is a 79 y.o. female w/ PMHx of CVA (L. MCA, 2009), known history of dysphagia (2/2 CVA), dementia, COPD, HTN, CHF (EF 40-45% in 9/14) and HOCM, paroxysmal A-fib, symptomatic bradycardia w/ PPM, and recent admission for aspiration pneumonia in October, presents with worsening dysphagia and course breathing x1 day.   #Failure to thrive - Patient with known dysphasia and multiple admissions for aspiration and aspiration pneumonia. She is cachectic with severe protein calorie malnutrition, as evidenced by an albumin of 1.9. The patient has a PICC line, but the family did not want to pursue a PEG after her admission in 10/14 for acute on chronic respiratory failure. Apparently they refused palliative care consult at that time.  - D5 1/2NS @ 10-20 ml/hr  - Will need to have discussion with family about goals of care > Family is open to palliative care discussion, they had a bad experience with hospice in the past, consult has been placed - Nutrition consult > Recommend diet advancement as able pending SLP (who recommend NPO), will need to re-consult if we pursue TF, will address with family after palliative care has talked to them - Mag and phos wnl - CBC, BMP in am  #Severe dysphagia, now with aspiration pneumonia - Patient w/ known history of dysphagia 2/2 CVA in  2009, worse since yesterday. Concern for aspiration with history of recurrent aspiration pneumonia. CXR performed on admission showed persistent cardiomegaly with bilateral pleural effusions and basilar atelectasis/consolidation, but with no significant interval change since previous admission. Improvement in clinical status this morning. Breathing more comfortably. More alert. All vital signs stable. Saturating 100% on home 2L o2. Lung exam is improved. - Continue Clindamycin 600mg  IV q8h for presumed aspiration pneumonia - Repeating CXR > appears unchanged, awaiting finial radiology read - NPO  - SLP evaluate and treat > Recommend deep suction, past swallow evaluations have recommended NPO status due to severity of dysphagia, anticipate repeat BSE/MBS would yield no different results/recommendations of NPO status due to  high aspiration risk - Continue home Xopenex nebs q4h  - Strict bed rest  - Routine vital signs  - Oral care/ deep suctioning  #Elevated INR likely 2/2 vitamin K deficiency - INR elevated on recheck today at 7.5, confirmed by repeat testing. Hepatic function panel grossly normal, platelets at baseline (122). No signs or symptoms of bleeding. Process likely 2/2 nutritional deficiency. - Giving 2.5mg  vitamin K IV per pharmacy recommendations - Repeat PT/INR in 12 hours  INR  Date Value Range Status  01/30/2013 7.21* 0.00 - 1.49 Final     CRITICAL RESULT CALLED TO, READ BACK BY AND VERIFIED WITH:     WELLINGTON N RN 01/30/13 1144 COSTELLO B  01/30/2013 7.63* 0.00 - 1.49 Final     CRITICAL RESULT CALLED TO, READ BACK BY AND VERIFIED WITH:     Argentina Donovan RN 01/30/13 0802 COSTELLO B     REPEATED TO VERIFY  01/27/2013 2.9   Final     [place]     #Chronic right shoulder wound - No signs or symptoms of infection. Per caretakers, the dermatologist she saw today was concerned that this represented a squamous cell carcinoma.  - Wound care consult > Foam dressings to protect and  promote healing - Patient will need outpatient dermatology followup   #Stage 2 sacral ulcer - Evaluated by wound care who are familiar with the patient and note improvement in appearance of this ulcer. - Wound care consult > Foam dressing to protect and absorb drainage  #Chronic combined heart failure - On Lasix and Spironolactone at home. Follows w/ Dr. Graciela Husbands. Now with some increased sacral and pelvic edema. - Restarting Lasix 5mg  IV (on 10mg  po at home)  #Paroxysmal atrial fibrillation on Coumadin - Currently in normal sinus rhythm. INR 2.9 on admission.  - Holding coumadin given NPO and supra therapeutic INR as above  #HTN - Well controlled at 142/63. - Continue to monitor  #DVT px - Supra therapeutic INR, will add AC as needed based on repeat INR testing  #Code status - DNR/DNI   Dispo: Disposition is deferred at this time, awaiting improvement of current medical problems.  Anticipated discharge in approximately 1-3 day(s).   The patient does have a current PCP (Provider Default, MD) and does need an [facility] hospital follow-up appointment after discharge.  The patient does not have transportation limitations that hinder transportation to clinic appointments.  .Services Needed at time of discharge: Y = Yes, Blank = No PT:   OT:   RN:   Equipment:   Other:     LOS: 2 days   Vivi Barrack, MD 01/30/2013, 12:14 PM

## 2013-01-31 LAB — CBC
HCT: 27.4 % — ABNORMAL LOW (ref 36.0–46.0)
Hemoglobin: 8.6 g/dL — ABNORMAL LOW (ref 12.0–15.0)
MCH: 30.7 pg (ref 26.0–34.0)
MCHC: 31.4 g/dL (ref 30.0–36.0)
Platelets: 127 10*3/uL — ABNORMAL LOW (ref 150–400)
RDW: 16.8 % — ABNORMAL HIGH (ref 11.5–15.5)
WBC: 5.2 10*3/uL (ref 4.0–10.5)

## 2013-01-31 LAB — PROTIME-INR
INR: 1.71 — ABNORMAL HIGH (ref 0.00–1.49)
Prothrombin Time: 15.5 seconds — ABNORMAL HIGH (ref 11.6–15.2)

## 2013-01-31 LAB — BASIC METABOLIC PANEL
BUN: 18 mg/dL (ref 6–23)
Calcium: 7.6 mg/dL — ABNORMAL LOW (ref 8.4–10.5)
Creatinine, Ser: 0.47 mg/dL — ABNORMAL LOW (ref 0.50–1.10)
GFR calc non Af Amer: 83 mL/min — ABNORMAL LOW (ref >90)
Glucose, Bld: 86 mg/dL (ref 70–99)
Potassium: 3.4 mEq/L — ABNORMAL LOW (ref 3.5–5.1)

## 2013-01-31 NOTE — Progress Notes (Signed)
Dr. Truitt Merle of patient's inability to tolerate dysphagia 3 diet as reported by outgoing RN,order to keep pt NPO for now. Antoinette Borgwardt Joselita,RN

## 2013-01-31 NOTE — Progress Notes (Addendum)
Patient was started on a Dysphagia 3 diet (pudding thick liquids). Trialed patient with a few small bites of pudding. Patient unable to swallow even small amount; she immediately begins choking, and then begins to cough up phlegm and requires immediate oral suctioning. Patient kept upright for >30 minutes after attempted feeding, to prevent further aspiration.  Will notify MD.

## 2013-01-31 NOTE — Progress Notes (Signed)
Subjective: Her caregiver states that she is breathing better and comfortable.   Objective: Vital signs in last 24 hours: Filed Vitals:   01/30/13 2138 01/31/13 0149 01/31/13 0400 01/31/13 1005  BP: 149/73  168/74 175/73  Pulse: 72  71 74  Temp: 97.5 F (36.4 C)  97.7 F (36.5 C) 97.6 F (36.4 C)  TempSrc: Oral  Oral Axillary  Resp: 19  18 20   Height: 5\' 5"  (1.651 m)     Weight: 127 lb 3.2 oz (57.698 kg)     SpO2: 100% 98% 92% 94%   Weight change: -3 lb 4.8 oz (-1.497 kg)  Intake/Output Summary (Last 24 hours) at 01/31/13 1203 Last data filed at 01/30/13 1300  Gross per 24 hour  Intake      0 ml  Output      0 ml  Net      0 ml   Vitals reviewed. General: resting in bed comfortably, cachetic.  HEENT: No scleral icterus, MM Cardiac: RRR, no rubs, murmurs or gallops Pulm: No respiratory distress, scattered rhonchi with bibasilar rales.  Chest: PICC in place at LUE.  Abd: soft, nontender, nondistended, BS present Ext: warm and well perfused, no pedal edema Neuro: Alert, more responsive, answers yes or no to close ended questions.   Lab Results: Basic Metabolic Panel:  Recent Labs Lab 01/30/13 0600 01/31/13 0500  NA 141 140  K 3.6 3.4*  CL 104 103  CO2 34* 34*  GLUCOSE 89 86  BUN 24* 18  CREATININE 0.55 0.47*  CALCIUM 8.1* 7.6*  MG 2.1  --   PHOS 3.1  --    Liver Function Tests:  Recent Labs Lab 01/28/13 1900 01/30/13 1110  AST 45* 33  ALT 22 21  ALKPHOS 239* 203*  BILITOT 0.7 0.5  PROT 7.5 6.0  ALBUMIN 2.0* 1.6*    Recent Labs Lab 01/30/13 0600 01/31/13 0500  WBC 6.7 5.2  NEUTROABS 4.6  --   HGB 8.4* 8.6*  HCT 26.1* 27.4*  MCV 98.1 97.9  PLT 122* 127*    BNP:  Recent Labs Lab 01/28/13 1227  PROBNP 2180.0*   Coagulation:  Recent Labs Lab 01/27/13 01/30/13 0600 01/30/13 1110 01/30/13 2300  LABPROT  --  61.4* 58.7* 19.6*  INR 2.9 7.63* 7.21* 1.71*   Urinalysis:  Recent Labs Lab 01/28/13 1444  COLORURINE AMBER*    LABSPEC 1.020  PHURINE 5.0  GLUCOSEU NEGATIVE  HGBUR NEGATIVE  BILIRUBINUR NEGATIVE  KETONESUR 15*  PROTEINUR NEGATIVE  UROBILINOGEN 1.0  NITRITE NEGATIVE  LEUKOCYTESUR NEGATIVE    Micro Results: Recent Results (from the past 240 hour(s))  URINE CULTURE     Status: None   Collection Time    01/28/13  2:44 PM      Result Value Range Status   Specimen Description URINE, RANDOM   Final   Special Requests NONE   Final   Culture  Setup Time     Final   Value: 01/28/2013 15:30     Performed at [facility] Count     Final   Value: >=100,000 COLONIES/ML     Performed at [facility]   Culture     Final   Value: Multiple bacterial morphotypes present, none predominant. Suggest appropriate recollection if clinically indicated.     Performed at [facility]   Report Status 01/29/2013 FINAL   Final   Studies/Results: Dg Chest Port 1 View  01/30/2013   CLINICAL DATA:  Short of breath  EXAM: PORTABLE CHEST - 1 VIEW  COMPARISON:  01/28/2013  FINDINGS: Bilateral effusions and bibasilar atelectasis unchanged. Chronic lung disease is noted.  Right upper lobe airspace disease appears more prominent, possible pneumonia  IMPRESSION: Possible right upper lobe pneumonia or edema.  No change bibasilar atelectasis and effusion.   Electronically Signed   By: Marlan Palau M.D.   On: 01/30/2013 13:08   Medications: I have reviewed the patient's current medications. Scheduled Meds: . antiseptic oral rinse  15 mL Mouth Rinse q12n4p  . chlorhexidine  15 mL Mouth Rinse BID  . clindamycin (CLEOCIN) IV  600 mg Intravenous Q8H  . furosemide  5 mg Intravenous Daily  . levalbuterol  0.63 mg Nebulization Q6H  . neomycin-bacitracin-polymyxin   Topical Daily   Continuous Infusions: . dextrose 5 % and 0.45% NaCl 20 mL/hr at 01/30/13 2114   PRN Meds:.levalbuterol, sodium chloride Assessment/Plan: [name] is a 79 y.o. woman w/ PMHx of CVA (L. MCA, 2009),  known history of dysphagia (2/2 CVA), dementia, COPD, HTN, CHF (EF 40-45% in 9/14) and HOCM, paroxysmal A-fib, symptomatic bradycardia w/ PPM, and recent admission for aspiration pneumonia in October, presents with worsening dysphagia and course breathing x1 day.   Failure to thrive - She has known dysphasia and multiple admissions for aspiration and aspiration pneumonia. She is cachectic with severe protein calorie malnutrition, as evidenced by an albumin of 1.9. PICC line in place. During previous hospitalization in October the family did not want to pursue a PEG or palliative care consult. Speech therapy has seen the patient and recommends continuation of NPO status due to persistent dysphagia and risk for aspiration pneumonia. Palliative care meeting with family (daughter) scheduled for today.   - D5 1/2NS @ 10-20 ml/hr  - Palliative care consulted, appreciate help with this patient.   Severe dysphagia, now with aspiration pneumonia - Patient w/ known history of dysphagia 2/2 CVA in 2009, witth history of recurrent aspiration pneumonia. CXR performed on presentation with persistent cardiomegaly with bilateral pleural effusions and basilar atelectasis/consolidation which are chronic findings. Repeat CXR unchanged. She has improved clinically with improvement in her mental status.   -Continue Clindamycin 600mg  IV q8h for presumed aspiration pneumonia  - NPO  - Continue home Xopenex nebs q4h  - Strict bed rest  - Oral care/ deep suctioning   Elevated INR -  INR elevated on recheck to 7.5, confirmed by repeat testing. Hepatic function panel normal, last dose of warfarin 2 days prior to this lad finding. She received vitamin K 2.5mg  IV with repeat INR decreased to 1.71 which is a significant and unexpectant decrease in the INR.   Dispo: Disposition is deferred at this time, awaiting improvement of current medical problems and palliative planning.   The patient does have a current PCP and does need an  [facility] hospital follow-up appointment after discharge.  The patient does have transportation limitations that hinder transportation to clinic appointments.  .Services Needed at time of discharge: Y = Yes, Blank = No PT:   OT:   RN:   Equipment:   Other:     LOS: 3 days   Ky Barban, MD 01/31/2013, 12:03 PM

## 2013-01-31 NOTE — Progress Notes (Signed)
Spoke with patient's daughter Dawn Barrett who is traveling from [place] to [place] tomorrow to help with her mother's care. I will meet with her around 2PM to further discuss goals of care. At this time they are not interested in Hospice but are interested in maximizing comfort feeding- they want an SLP evaluation specifically to advise and teach the caregiver how to do aspiration precautions. They asked about IV fluids but I strongly advised against this given her CHF-additionally I do not think there is an ongoing need for IV antibiotics. They want PICC line maintained even if not being actively used at home.  Overall the plan is for her to go home with her caregivers and home health and physician home visits. Will update team tomorrow.  I changed her diet to D3 which should be given with full assist and comfort feeding.   Anderson Malta, DO Palliative Medicine

## 2013-01-31 NOTE — Progress Notes (Signed)
Internal Medicine Teaching Service Night Float  Called by nursing that patient did not tolerate Dysphasia 3 diet.  Will place patient NPO for now, and will reevaluate PRN.  Will update day team.  Carlynn Purl, DO 7:54 PM

## 2013-02-01 MED ORDER — FLUCONAZOLE 100MG IVPB
100.0000 mg | INTRAVENOUS | Status: DC
  Administered 2013-02-01 – 2013-02-05 (×5): 100 mg via INTRAVENOUS
  Filled 2013-02-01 (×5): qty 50

## 2013-02-01 MED ORDER — CHLORHEXIDINE GLUCONATE 0.12 % MT SOLN
15.0000 mL | Freq: Four times a day (QID) | OROMUCOSAL | Status: DC
  Administered 2013-02-01 – 2013-02-05 (×14): 15 mL via OROMUCOSAL
  Filled 2013-02-01 (×18): qty 15

## 2013-02-01 MED ORDER — FLUCONAZOLE 100MG IVPB: 100.0000 mg | Freq: Two times a day (BID) | INTRAVENOUS | Status: DC

## 2013-02-01 MED ORDER — POLYVINYL ALCOHOL 1.4 % OP SOLN
1.0000 [drp] | OPHTHALMIC | Status: DC | PRN
  Administered 2013-02-01 – 2013-02-04 (×2): 1 [drp] via OPHTHALMIC
  Filled 2013-02-01: qty 15

## 2013-02-01 MED ORDER — MAGIC MOUTHWASH
5.0000 mL | Freq: Four times a day (QID) | ORAL | Status: DC
  Administered 2013-02-01 – 2013-02-05 (×16): 5 mL via ORAL
  Filled 2013-02-01 (×20): qty 5

## 2013-02-01 MED ORDER — SODIUM CHLORIDE 0.9 % IN NEBU: 3.0000 mL | INHALATION_SOLUTION | RESPIRATORY_TRACT | Status: DC | PRN

## 2013-02-01 NOTE — ED Provider Notes (Signed)
  This was a shared visit with a mid-level provided (NP or PA).  Throughout the patient's course I was available for consultation/collaboration.  I saw the ECG (if appropriate), relevant labs and studies - I agree with the interpretation. The XR was most notably abnormal for spinal curvature.  On my exam the patient was uncomfortable appearing, minimally interactive.  I discussed her presentation and findings (with her Hx of similar recent episodes) with her daughter (at length). With concern for PNA the patient was started on ABX, and admitted for further E/M.       Gerhard Munch, MD 02/01/13 [phone]

## 2013-02-01 NOTE — Progress Notes (Signed)
Met with patient, her main caregiver Elane Fritz and her daughter Sahian Kerney to further discuss goals of care. Mrs. Reaser has a progressive dysphagia. 3rd admission in 3 months for aspiration and dysphagia.  Summary of Goals:  1. DNR  2. This family has a team of paid caregivers who provide in home care for Mrs. Rog- they are not interested in [facility] currently, but I do not think they would be opposed to hospice when the they feel they are truly at the very end of life and need something like a morphine infusion etc...  3. Daughter would like all reversible causes of dysphagia treated empirically-while I was in the room the patient expectorated a large amount of whitish grey material-the daughter is requesting I treat thrush which is Clemon Chambers has been on antibiotics and has a history of thrush worsening her dysphagia. Will give her Fluconazole 100mg  IV daily and reassess her swallowing tomorrow.  4. She has very poor dentition so I recommend QID peridex to swabbed in her mouth.  5. Sodium Chloride Nebs for loosening of respiratory secretions.  6. Consider stopping Clindamycin- C.Diff would be miserable for her-she is afebrile, no WBC elevation- repeat CXR.  7. I stopped her Lasix, she has not had meaningful PO intake since admission. IV is KVO.  I had a very frank conversation with her daughter regarding EOL issues- goals are for her to swallow again-avoid hospitalization-and go home with home health and her caregivers when medically stable or maximized.  Anderson Malta, DO Palliative Medicine'

## 2013-02-01 NOTE — Progress Notes (Signed)
I will discuss patient's inability to tolerate any PO with her daughter tomorrow afternoon-patient must be in an awake and alert state when food is offered- per her daughter she was "very alert with her caregiver today". I will call SLP in the AM-I think having their expert opinion on this will be key for this patient, her caregivers and her daughter-hopefully we can coordinate.  Anderson Malta, DO Palliative Medicine

## 2013-02-01 NOTE — Progress Notes (Signed)
Subjective: Patient seen and examined at the bedside. Per caregiver she is breathing comfortably but still less alert than normal. She was unable to tolerate a Dys 3 diet overnight with aspiration, choking, and regurgitation. SLP and Palliative Care are on board.  Objective: Vital signs in last 24 hours: Filed Vitals:   01/31/13 2049 01/31/13 2100 02/01/13 0211 02/01/13 0500  BP:  146/67  141/81  Pulse:  68  81  Temp:  97.5 F (36.4 C)  97.3 F (36.3 C)  TempSrc:  Oral  Oral  Resp:  18  18  Height:      Weight:      SpO2: 97% 92% 97% 96%   Weight change:   Intake/Output Summary (Last 24 hours) at 02/01/13 0728 Last data filed at 02/01/13 0651  Gross per 24 hour  Intake    627 ml  Output      0 ml  Net    627 ml   Vitals reviewed. General: resting in bed comfortably, cachetic.  HEENT: No scleral icterus, MM Cardiac: RRR, no rubs, murmurs or gallops Pulm: No respiratory distress, scattered rhonchi with bibasilar rales.  Chest: PICC in place at LUE.  Abd: soft, nontender, nondistended, BS present Ext: warm and well perfused, no pedal edema Neuro: Alert, tracking with eyes but not responding verbally to questions   Lab Results: Basic Metabolic Panel:  Recent Labs Lab 01/30/13 0600 01/31/13 0500  NA 141 140  K 3.6 3.4*  CL 104 103  CO2 34* 34*  GLUCOSE 89 86  BUN 24* 18  CREATININE 0.55 0.47*  CALCIUM 8.1* 7.6*  MG 2.1  --   PHOS 3.1  --    Liver Function Tests:  Recent Labs Lab 01/28/13 1900 01/30/13 1110  AST 45* 33  ALT 22 21  ALKPHOS 239* 203*  BILITOT 0.7 0.5  PROT 7.5 6.0  ALBUMIN 2.0* 1.6*    Recent Labs Lab 01/30/13 0600 01/31/13 0500  WBC 6.7 5.2  NEUTROABS 4.6  --   HGB 8.4* 8.6*  HCT 26.1* 27.4*  MCV 98.1 97.9  PLT 122* 127*    BNP:  Recent Labs Lab 01/28/13 1227  PROBNP 2180.0*   Coagulation:  Recent Labs Lab 01/30/13 0600 01/30/13 1110 01/30/13 2300 01/31/13 1110  LABPROT 61.4* 58.7* 19.6* 15.5*  INR 7.63*  7.21* 1.71* 1.26   Urinalysis:  Recent Labs Lab 01/28/13 1444  COLORURINE AMBER*  LABSPEC 1.020  PHURINE 5.0  GLUCOSEU NEGATIVE  HGBUR NEGATIVE  BILIRUBINUR NEGATIVE  KETONESUR 15*  PROTEINUR NEGATIVE  UROBILINOGEN 1.0  NITRITE NEGATIVE  LEUKOCYTESUR NEGATIVE    Micro Results: Recent Results (from the past 240 hour(s))  URINE CULTURE     Status: None   Collection Time    01/28/13  2:44 PM      Result Value Range Status   Specimen Description URINE, RANDOM   Final   Special Requests NONE   Final   Culture  Setup Time     Final   Value: 01/28/2013 15:30     Performed at [facility] Count     Final   Value: >=100,000 COLONIES/ML     Performed at [facility]   Culture     Final   Value: Multiple bacterial morphotypes present, none predominant. Suggest appropriate recollection if clinically indicated.     Performed at [facility]   Report Status 01/29/2013 FINAL   Final   Studies/Results: Dg Chest Port 1 [address]  01/30/2013   CLINICAL DATA:  Short of breath  EXAM: PORTABLE CHEST - 1 VIEW  COMPARISON:  01/28/2013  FINDINGS: Bilateral effusions and bibasilar atelectasis unchanged. Chronic lung disease is noted.  Right upper lobe airspace disease appears more prominent, possible pneumonia  IMPRESSION: Possible right upper lobe pneumonia or edema.  No change bibasilar atelectasis and effusion.   Electronically Signed   By: Marlan Palau M.D.   On: 01/30/2013 13:08   Medications: I have reviewed the patient's current medications. Scheduled Meds: . antiseptic oral rinse  15 mL Mouth Rinse q12n4p  . chlorhexidine  15 mL Mouth Rinse BID  . clindamycin (CLEOCIN) IV  600 mg Intravenous Q8H  . furosemide  5 mg Intravenous Daily  . levalbuterol  0.63 mg Nebulization Q6H  . neomycin-bacitracin-polymyxin   Topical Daily   Continuous Infusions: . dextrose 5 % and 0.45% NaCl 20 mL/hr at 01/30/13 2114   PRN Meds:.levalbuterol, sodium  chloride Assessment/Plan: [name] is a 79 y.o. woman w/ PMHx of CVA (L. MCA, 2009), known history of dysphagia (2/2 CVA), dementia, COPD, HTN, CHF (EF 40-45% in 9/14) and HOCM, paroxysmal A-fib, symptomatic bradycardia w/ PPM, and recent admission for aspiration pneumonia in October, presents with worsening dysphagia and course breathing x1 day.   #Failure to thrive - She has known dysphasia and multiple admissions for aspiration and aspiration pneumonia. She is cachectic with severe protein calorie malnutrition, as evidenced by an albumin of 1.9. PICC line in place. During previous hospitalization in October the family did not want to pursue a PEG or palliative care consult. Speech therapy has seen the patient and recommends continuation of NPO status due to persistent dysphagia and risk for aspiration pneumonia. Palliative care met with family yesterday (daughter Rod Holler) who was not interested in Hospice but wanted comfort feeds with Dys 3 diet. Unfortunately patient did not tolerate this diet overnight 2/2 choking, coughing, aspiration.  - D5 1/2NS @ 10-20 ml/hr  - Palliative care and SLP consulted, appreciate help with this patient - Continue NPO for now - Overall the plan is for her to go home with her caregivers and home health and physician home visits when medically ready, but first must determine ability to feed and goals of care  #Severe dysphagia, now with aspiration pneumonia - Patient w/ known history of dysphagia 2/2 CVA in 2009, witth history of recurrent aspiration pneumonia. CXR performed on presentation with persistent cardiomegaly with bilateral pleural effusions and basilar atelectasis/consolidation which are chronic findings. Repeat CXR unchanged. She has improved clinically with improvement in her mental status.   -Continue Clindamycin 600mg  IV q8h for presumed aspiration pneumonia  - Continue home Xopenex nebs q4h  - Strict bed rest  - Oral care/ deep suctioning    #Elevated INR -  INR elevated on Friday to 7.2, confirmed by repeat testing. Hepatic function panel normal, last dose of warfarin 2 days prior to this lab finding. She received vitamin K 2.5mg  IV with repeat INR decreased to 1.71 which is a significant and unexpectant decrease in the INR. Repeat INR 1.26 this am. - Continue to monitor for signs/symptioms of bleeding  #Heart failure with mild depression of ejection fraction, HOCM - EF 40-45% in 9/14. - Continue home Lasix 5mg  IV, currently clinically euvolemic  Dispo: Disposition is deferred at this time, awaiting improvement of current medical problems and palliative planning.   The patient does have a current PCP and does need an [facility] hospital follow-up appointment after discharge.  The  patient does have transportation limitations that hinder transportation to clinic appointments.  .Services Needed at time of discharge: Y = Yes, Blank = No PT:   OT:   RN:   Equipment:   Other:     LOS: 4 days   Vivi Barrack, MD 02/01/2013, 7:28 AM

## 2013-02-01 NOTE — Evaluation (Signed)
Clinical/Bedside Swallow Evaluation Patient Details  Name: Dawn Barrett MRN: [id number] Date of Birth: October 19, 1918  Today's Date: 02/01/2013 Time: 1330-1400 SLP Time Calculation (min): 30 min  Past Medical History:  Past Medical History  Diagnosis Date  . Stroke     a. L MCA CVA 2009.  Marland Kitchen Hypertension   . COPD (chronic obstructive pulmonary disease)   . Paroxysmal atrial fibrillation   . NSTEMI (non-ST elevated myocardial infarction)     a. 10/2012 - managed medically.  . Chronic combined systolic and diastolic CHF (congestive heart failure)   . Symptomatic bradycardia     a. s/p St. Jude pacemaker 2009.  Marland Kitchen PNA (pneumonia)     a. Per notes, h/o pulm injury r/t PNA.  aspiration PNA 11/2012  . LBBB (left bundle branch block)   . HOCM (hypertrophic obstructive cardiomyopathy)     a. 2D Echo 10/2012: EF 40-45%, mild LVH, mild-mod TR, mitral SAM with mod MR, LVOT gradient not well assessed but at least 2.27m/s; findings c/w HOCM.  Marland Kitchen Mitral regurgitation     a. Mod MR by echo 10/2012.  . Tricuspid regurgitation     a. Mild-mod TR by echo 10/2012.  Marland Kitchen Dysphagia    Past Surgical History:  Past Surgical History  Procedure Laterality Date  . Pacemaker insertion  11/19/2007    St. Jude Zephyr 5020 pulse generator, serial [id number]  . Femur surgery Right 06/2011    Rod inserted after a fall.    HPI:  Dawn Barrett is a 79 y.o. female w/ PMHx of CVA (L. MCA, 2009), COPD, HTN, CHF, HOCM, paroxysmal A-fib, symptomatic bradycardia w/ PPM, and known history of dysphagia (2/2 CVA), presents from home w/ worsening dysphagia since Friday. Patient accompanied by daughter who provided history. Dawn Barrett typically eats pureed diet and has a large appetite on most days, but since Friday, she has been eating less and has increased difficulty swallowing foods completely w/ repeated regurgitation. Since this time she has been almost completely unable to take food or liquid by mouth. She is  taken care of by Dawn Barrett who started D5NS at home. The patient also has a chronic history of aspiration. The daughter also claims her mother has been more lethargic ovr the past few days as well.  BSE ordered on 01/28/13.  ST unable to complete evaluation as patient poorly responsive at that time.  ST progress note on 01/30/13 recommends patient continue with NPO status with no expectation of improvement. RN paged treating SLP this date for recommendations.  Palliative Care Physician contacted and confirmed to proceed with BSE this date.  Patient's daughter present for evaluation and states she needs specific instructions on how much to puree to allow and how many times a day to allow PO's.    Assessment / Plan / Recommendation Clinical Impression  BSE completed but limited to two trials of puree by 1/4 teaspoon.  Continues with baseline oral phase dysphagia with severe tongue pumping with subsequent delay in posterior transit.  Initial bolus took >20 seconds to initiate swallow.  Multiple swallows required per bolus indicating weakness with possible containment.  No observed discomfort during swallow.    No outward clinical s/s of aspiration noted after swallow but patient's daughter reports patient has history of silent aspiration and PO trials were limited.  Recommend continued NPO status due to history of chronic aspiration and current weakness.  Per daughter patient on conservative diet of puree and pudding thick liquids at home but presented  with recent difficulty "swallowing" with repeated regurgitation.  With reports of regurgitation question esophageal based dysphagia as well as suspected pharyngeal dysphagia.  Daughter questioned if compensatory strategy of "chin tuck" would be effective in reducing risk of aspiration.   Cannot determine safest, PO diet with subjective evaluation.  If GOC is to continue PO's with known risk vs. Comfort feeds only completion of objective evaluation of MBS may be beneficial  to determine safest, diet with effective compensatory strategies/and provide diagnostic information.  Skilled Education provided to family member and caregiver concerning comfort feeds  with f/u required.  Palliative Care meeting pending for this date.  ST to f/u for POC.    Aspiration Risk  Severe    Diet Recommendation NPO   Medication Administration: Via alternative means    Other  Recommendations Oral Care Recommendations: Oral care Q4 per protocol   Follow Up Recommendations  24 hour supervision/assistance    Frequency and Duration min 2x/week  2 weeks       SLP Swallow Goals  Refer to Care Plan    Swallow Study Prior Functional Status       General Date of Onset: 01/28/13 HPI: Dawn Barrett is a 79 y.o. female w/ PMHx of CVA (L. MCA, 2009), COPD, HTN, CHF, HOCM, paroxysmal A-fib, symptomatic bradycardia w/ PPM, and known history of dysphagia (2/2 CVA), presents from home w/ worsening dysphagia since Friday. Patient accompanied by daughter who provided history. [name] typically eats pureed diet and has a large appetite on most days, but since Friday, she has been eating less and has increased difficulty swallowing foods completely w/ repeated regurgitation. Since this time she has been almost completely unable to take food or liquid by mouth. She is taken care of by Dawn Barrett who started D5NS at home. The patient also has a chronic history of aspiration. The daughter also claims her mother has been more lethargic ovr the past few days as well. Type of Study: Bedside swallow evaluation Previous Swallow Assessment: 11/08/12 BSE with diet rec's of mechanical soft and NTL10/09/14 with recommendations for NPO and f/u with MBS but study cancelled. MBS 2009 with results not available  Diet Prior to this Study: NPO Temperature Spikes Noted: No Respiratory Status: Nasal cannula History of Recent Intubation: No Behavior/Cognition: Alert;Cooperative;Pleasant mood;Requires  cueing;Hard of hearing Oral Cavity - Dentition: Adequate natural dentition Self-Feeding Abilities: Total assist Patient Positioning: Upright in bed Baseline Vocal Quality: Hoarse;Clear Volitional Cough: Cognitively unable to elicit Volitional Swallow: Able to elicit    Oral/Motor/Sensory Function Overall Oral Motor/Sensory Function: Appears within functional limits for tasks assessed   Ice Chips Ice chips: Not tested   Thin Liquid Thin Liquid: Not tested    Nectar Thick Nectar Thick Liquid: Not tested   Honey Thick Honey Thick Liquid: Not tested   Puree Puree: Impaired Oral Phase Impairments: Impaired anterior to posterior transit;Reduced lingual movement/coordination Oral Phase Functional Implications: Prolonged oral transit Pharyngeal Phase Impairments: Suspected delayed Swallow;Decreased hyoid-laryngeal movement;Multiple swallows   Solid   GO    Solid: Not tested      Moreen Fowler MS, CCC-SLP [phone] [place] Barrett 02/01/2013,4:15 PM

## 2013-02-02 ENCOUNTER — Inpatient Hospital Stay (HOSPITAL_COMMUNITY): Payer: Medicare Other

## 2013-02-02 DIAGNOSIS — J690 Pneumonitis due to inhalation of food and vomit: Principal | ICD-10-CM

## 2013-02-02 DIAGNOSIS — Z515 Encounter for palliative care: Secondary | ICD-10-CM

## 2013-02-02 MED ORDER — LEVALBUTEROL HCL 0.63 MG/3ML IN NEBU
0.6300 mg | INHALATION_SOLUTION | Freq: Three times a day (TID) | RESPIRATORY_TRACT | Status: DC
  Administered 2013-02-02 – 2013-02-03 (×5): 0.63 mg via RESPIRATORY_TRACT
  Filled 2013-02-02 (×9): qty 3

## 2013-02-02 NOTE — Care Management Note (Signed)
   CARE MANAGEMENT NOTE 02/02/2013  Patient:  Dawn Barrett, Dawn Barrett   Account Number:  [id number]  Date Initiated:  02/02/2013  Documentation initiated by:  Artemis Loyal  Subjective/Objective Assessment:   Order for consult due to home IV tubing needs.     Action/Plan:   Spoke with [facility] of [facility] re need for [facility] supplies, she will contact daughter , hospital does not provide HH supplies. Care [facility] rep will contact pt daughter.   Anticipated DC Date:  02/02/2013   Anticipated DC Plan:  HOME W HOME HEALTH SERVICES         Choice offered to / List presented to:             Status of service:  In process, will continue to follow Medicare Important Message given?   (If response is "NO", the following Medicare IM given date fields will be blank) Date Medicare IM given:   Date Additional Medicare IM given:    Discharge Disposition:    Per UR Regulation:    If discussed at Long Length of Stay Meetings, dates discussed:    Comments:

## 2013-02-02 NOTE — Telephone Encounter (Signed)
LMOM for return call to correlate pacer check in hospital/kwm

## 2013-02-02 NOTE — Progress Notes (Signed)
    Date: 02/02/2013      Patient name: Dawn Barrett  Medical record number: [id number]  Date of birth: 1919/02/04  Assessment: 79 year old with chronic dysphagia, aspiration pna, failure to thrive.   Exam today: She appears alert, nods to verbal cues. Exam significant for vesicular breath sounds in lungs, and less gurgling, some crackles and rhonchi. Oropharynx does not show white exudate perhaps some whitish spots, also mildly hyperemic.   Plan  Aspiration pna - I agree with stopping clinda as the patient is doing much better as the risks (diarrhea) might outweigh the benefits.    Nutrition - Agree with plan to resume comfort feeds. Appreciate SLP recs.   Palliative care - Appreciate consult. Family in agreement with current plan per Dr. Lamar Blinks and SLP notes.   Possible candida esophagus - Though her oropharynx appears free of confluent white exudate, I have reviewed her previous notes from last admission and she had benefited from fluconazole at that time. Thus I am willing to try it. We will see how she does tomorrow after receiving it.  Case reviewed with IM resident team; rest of the plan per resident note,   Dalphine Handing, [facility] 02/02/2013, 4:27 PM.

## 2013-02-02 NOTE — Progress Notes (Signed)
Subjective: Patient seen and examined at the bedside. She looks great. Her breathing is not labored. She follows simple commands ("open your mouth"). She answers yes or no questions with nodding.  Objective: Vital signs in last 24 hours: Filed Vitals:   02/01/13 2055 02/01/13 2122 02/02/13 0201 02/02/13 0546  BP:  165/66  164/70  Pulse:  70  71  Temp:  97.4 F (36.3 C)  97.9 F (36.6 C)  TempSrc:  Axillary  Axillary  Resp:  18  18  Height:  5\' 5"  (1.651 m)    Weight:  123 lb 14.4 oz (56.201 kg)    SpO2: 97% 98% 99% 94%   Weight change:   Intake/Output Summary (Last 24 hours) at 02/02/13 0836 Last data filed at 02/01/13 1900  Gross per 24 hour  Intake    290 ml  Output      0 ml  Net    290 ml   Vitals reviewed. General: resting in bed comfortably, cachetic.  HEENT: No scleral icterus, MM Cardiac: RRR, no rubs, murmurs or gallops Pulm: No respiratory distress, scattered rhonchi with bibasilar rales.  Chest: PICC in place at LUE.  Abd: soft, nontender, nondistended, BS present Ext: warm and well perfused, no pedal edema Neuro: Alert, responsive, following commands  Lab Results: Basic Metabolic Panel:  Recent Labs Lab 01/30/13 0600 01/31/13 0500  NA 141 140  K 3.6 3.4*  CL 104 103  CO2 34* 34*  GLUCOSE 89 86  BUN 24* 18  CREATININE 0.55 0.47*  CALCIUM 8.1* 7.6*  MG 2.1  --   PHOS 3.1  --    Liver Function Tests:  Recent Labs Lab 01/28/13 1900 01/30/13 1110  AST 45* 33  ALT 22 21  ALKPHOS 239* 203*  BILITOT 0.7 0.5  PROT 7.5 6.0  ALBUMIN 2.0* 1.6*    Recent Labs Lab 01/30/13 0600 01/31/13 0500  WBC 6.7 5.2  NEUTROABS 4.6  --   HGB 8.4* 8.6*  HCT 26.1* 27.4*  MCV 98.1 97.9  PLT 122* 127*    BNP:  Recent Labs Lab 01/28/13 1227  PROBNP 2180.0*   Coagulation:  Recent Labs Lab 01/30/13 0600 01/30/13 1110 01/30/13 2300 01/31/13 1110  LABPROT 61.4* 58.7* 19.6* 15.5*  INR 7.63* 7.21* 1.71* 1.26   Urinalysis:  Recent  Labs Lab 01/28/13 1444  COLORURINE AMBER*  LABSPEC 1.020  PHURINE 5.0  GLUCOSEU NEGATIVE  HGBUR NEGATIVE  BILIRUBINUR NEGATIVE  KETONESUR 15*  PROTEINUR NEGATIVE  UROBILINOGEN 1.0  NITRITE NEGATIVE  LEUKOCYTESUR NEGATIVE    Micro Results: Recent Results (from the past 240 hour(s))  URINE CULTURE     Status: None   Collection Time    01/28/13  2:44 PM      Result Value Range Status   Specimen Description URINE, RANDOM   Final   Special Requests NONE   Final   Culture  Setup Time     Final   Value: 01/28/2013 15:30     Performed at [facility] Count     Final   Value: >=100,000 COLONIES/ML     Performed at [facility]   Culture     Final   Value: Multiple bacterial morphotypes present, none predominant. Suggest appropriate recollection if clinically indicated.     Performed at [facility]   Report Status 01/29/2013 FINAL   Final   Studies/Results: No results found. Medications: I have reviewed the patient's current medications. Scheduled Meds: . antiseptic  oral rinse  15 mL Mouth Rinse q12n4p  . chlorhexidine  15 mL Mouth Rinse QID  . fluconazole (DIFLUCAN) IV  100 mg Intravenous Q24H  . levalbuterol  0.63 mg Nebulization Q6H  . magic mouthwash  5 mL Oral QID  . neomycin-bacitracin-polymyxin   Topical Daily   Continuous Infusions: . dextrose 5 % and 0.45% NaCl 20 mL/hr at 01/30/13 2114   PRN Meds:.levalbuterol, polyvinyl alcohol, sodium chloride, sodium chloride Assessment/Plan: Dawn Barrett is a 79 y.o. woman w/ PMHx of CVA (L. MCA, 2009), known history of dysphagia (2/2 CVA), dementia, COPD, HTN, CHF (EF 40-45% in 9/14) and HOCM, paroxysmal A-fib, symptomatic bradycardia w/ PPM, and recent admission for aspiration pneumonia in October, presents with worsening dysphagia and course breathing x1 day.   #Failure to thrive - She has known dysphasia and multiple admissions for aspiration and aspiration pneumonia. She is  cachectic with severe protein calorie malnutrition, as evidenced by an albumin of 1.9. PICC line in place. During previous hospitalization in October the family did not want to pursue a PEG or palliative care consult. Speech therapy has seen the patient and recommends continuation of NPO status due to persistent dysphagia and risk for aspiration pneumonia. However if GOC is to continue PO's with known risk vs. Comfort feeds only, they feel perhaps completion of objective evaluation of MBS may be beneficial to determine safest diet with effective compensatory strategies/and provide diagnostic information. I spoke with another SLP, Dawn Barrett, who understands the rationale but actually feels the study probably wouldn't give [place] any information we didn't already know, given that the pt was already on a puree and pudding thick diet at home and we know she aspirates. Dawn Barrett will touch base with family about whether or not the study should be done and what their wishes are. Palliative care has been meeting with the family. Family is not interested in hospice and would like to continue with their team of paid caregivers who provide in home care. Goals are for her to swallow again-avoid hospitalization-and go home when medically stable or maximized.  - D5 1/2NS @ 10-20 ml/hr  - Palliative care and SLP consulted, appreciate help with this patient - Continue NPO for now - After we hash out feeding goals and whether MBS is indicated, patient will likely be medically stable for discharge home later today or tomorrow - Family would like outpatient palliative referral > [facility] has been [facility] to [facility]  #Severe dysphagia, possible aspiration pneumonia - Patient w/ known history of dysphagia 2/2 CVA in 2009, witth history of recurrent aspiration pneumonia. CXR performed on presentation with persistent cardiomegaly with bilateral pleural effusions and basilar atelectasis/consolidation which are chronic  findings. Repeat CXR unchanged. She has improved clinically with improvement in her mental status.  - D/c Clindamycin as she is afebrile, no leukocytosis, repeat CXR unchanged, and C. Diff infection would be miserable for her - Added fluconazole 100mg  IV daily for presumed thrush - Follow up SLP re-evaluation - Care management consult for equipment needs > Per daughter, they need IV tubing for home PICC line care - Continue home Xopenex nebs q4h  - Strict bed rest  - Oral care/ deep suctioning   #Elevated INR -  Resolved. INR elevated on 12/5 to 7.2, confirmed by repeat testing. Hepatic function panel normal, last dose of warfarin 2 days prior to this lab finding. She received vitamin K 2.5mg  IV with repeat INR decreased to 1.71 which is a significant and unexpectant  decrease in the INR. Repeat INR 1.26 this am. - Continue to monitor for signs/symptioms of bleeding  INR  Date Value Range Status  01/31/2013 1.26  0.00 - 1.49 Final  01/30/2013 1.71* 0.00 - 1.49 Final  01/30/2013 7.21* 0.00 - 1.49 Final     CRITICAL RESULT CALLED TO, READ BACK BY AND VERIFIED WITH:     WE[place] N RN 01/30/13 1144 COSTELLO B  01/30/2013 7.63* 0.00 - 1.49 Final     CRITICAL RESULT CALLED TO, READ BACK BY AND VERIFIED WITH:     Argentina Donovan RN 01/30/13 0802 COSTELLO B     REPEATED TO VERIFY    #Heart failure with mild depression of ejection fraction, HOCM - EF 40-45% in 9/14. - D/c Lasix since still NPO, currently clinically euvolemic  Dispo: Disposition is deferred at this time, awaiting improvement of current medical problems and palliative planning.   The patient does have a current PCP and does need an [facility] hospital follow-up appointment after discharge.  The patient does have transportation limitations that hinder transportation to clinic appointments.  .Services Needed at time of discharge: Y = Yes, Blank = No PT:   OT:   RN:   Equipment:   Other:     LOS: 5 days   Vivi Barrack, MD 02/02/2013,  8:36 AM

## 2013-02-02 NOTE — Progress Notes (Signed)
Palliative Medicine Team Progress Note  Mrs. Cornman is much more alert this morning-after good mouth care she is able to tell me her mouth hurts. SLP arrived during my visit and we dis a thorough evaluation of her oral mucosa-again I continue to be suspicious of thrush-especially in her throat-she also has dried mucous which we suctioned. She was able to eat several bited of applesauce with out overt choking but she did bring up frothy grey mucous after eating mixed with applesauce-her issues seem to be primarily esophageal in addition to known pharyngeal dysfunction from her prior stroke compounded by weakness.  I spoke with her daughter Cindee Lame knows her mothers time is limited but wants to maximize any remaining time.  Plan:  1. Discharge tomorrow home 2. Will need 2 weeks of IV Fluconazole 100 mg daily and indefinite MMW QID (hard scripts for both) 3. Peridex swabs prior to eating (provide script) 4. Referral to [facility] [place] through Care Management 5. Do not resume other oral meds, discontinue antibiotic-high risk for C. Diff on Clindamycin no signs of infection currently. 6. Maintain PICC, ok to do D5 1/2 NS at 20cc/hr at home for maintenance fluids-would not do higher rate given her hx of CHF-this is daughters request. 7. Right now she doesn't seem to be in any distress-but I counseled her daughter on knowing when it may be time to consider full comfort and to call hospice-hopefully if palliative care follows her at home they can assist with the transition. 8. Recommend Magic Cup TID for nutrition at home (not sure if this can be ordered or not?)  35 minutes. Greater than 50%  of this time was spent counseling and coordinating care related to the above assessment and plan.  Anderson Malta, DO Palliative Medicine

## 2013-02-02 NOTE — Progress Notes (Signed)
Speech Language Pathology Treatment: Dysphagia  Patient Details Name: Dawn Barrett MRN: [id number] DOB: 03/25/1918 Today's Date: 02/02/2013 Time: 1000-1030 SLP Time Calculation (min): 30 min  Assessment / Plan / Recommendation Clinical Impression  Patient seen for dysphagia treatment and family education. Patient alert, oral care complete with possible thrush noted lingually. Dr. Phillips Odor present and aware. Patient with good improvement in oral transit time and initiation of the swallow with clinician provided po trials.   Able to transit a bolus and initiate swallow in 100% of attempts. Patient continues to present with evidence of both a pharyngeal and esophageal dysphagia characterized by multiple swallows, wet vocal quality, and noted regurgitation of bolus following 2-3 bites/sips. Question impact of possible candida on esophageal function exacerbating an already severe oropharyngeal dysphagia at baseline. At this time, do not feel that objective testing would give any useful information as we know that risk of aspiration is high. Discussed with Dr. Phillips Odor and daughter Dawn Barrett who are both in agreement for comfort feeds (puree, pudding thick liquid), understanding risk. Dr. Phillips Odor to address possible candida esophagitis. Educated family and caregiver on safe swallowing precautions including allowing patient extra time for multiple dry swallows to clear pharyngeal residuals and manage regurgitation. SLP will f/u 12/9 to assess for any improvements in function following treatment for candida and for any f/u education as needed.    HPI HPI: Dawn Barrett is a 79 y.o. female w/ PMHx of CVA (L. MCA, 2009), COPD, HTN, CHF, HOCM, paroxysmal A-fib, symptomatic bradycardia w/ PPM, and known history of dysphagia (2/2 CVA), presents from home w/ worsening dysphagia since Friday. Patient accompanied by daughter who provided history. [name] typically eats pureed diet and has a large appetite  on most days, but since Friday, she has been eating less and has increased difficulty swallowing foods completely w/ repeated regurgitation. Since this time she has been almost completely unable to take food or liquid by mouth. She is taken care of by [facility] who started D5NS at home. The patient also has a chronic history of aspiration. The daughter also claims her mother has been more lethargic ovr the past few days as well.      SLP Plan  Goals updated    Recommendations Diet recommendations: Dysphagia 1 (puree);Pudding-thick liquid Liquids provided via: Teaspoon Medication Administration: Crushed with puree Supervision: Staff to assist with self feeding;Full supervision/cueing for compensatory strategies Compensations: Slow rate;Small sips/bites;Multiple dry swallows after each bite/sip (allow time for multiple swallows) Postural Changes and/or Swallow Maneuvers: Seated upright 90 degrees;Upright 30-60 min after meal              Oral Care Recommendations: Oral care before and after PO Follow up Recommendations: 24 hour supervision/assistance Plan: Goals updated    GO   Dawn Lango MA, CCC-SLP [phone]   Dawn Barrett Dawn Barrett 02/02/2013, 11:42 AM

## 2013-02-02 NOTE — Progress Notes (Signed)
Please call [facility] [phone] on 02/03/13 to get information about getting pt's pacemaker checked.   If there are questions, please call Leonette Most (one of Dr. Odessa Fleming device techs). [phone].

## 2013-02-02 NOTE — Telephone Encounter (Signed)
Monica returned Kristin's call. Maxine Glenn is requesting Ms. Mehlhoff have her SJM ppm device checked in hospital. Maxine Glenn has the 800 # to pass along to staff RNs at [facility].   Monica gave me primary care giver's #, Verlon Au. Verlon Au said Ms. Applin will be discharged morning of 02/03/13. Verlon Au is giving the 800 # to [place], Ms. MeElveen's RN on 6[facility].   Also spoke w/ Rod Holler, pt's daughter in [place], to update the family of basic device interrogation in hospital per request of Verlon Au.

## 2013-02-03 MED ORDER — DEXTROSE-NACL 5-0.45 % IV SOLN: 20.0000 mL/h | INTRAVENOUS | Status: DC

## 2013-02-03 MED ORDER — FUROSEMIDE 10 MG/ML IJ SOLN
10.0000 mg | Freq: Once | INTRAMUSCULAR | Status: AC
  Administered 2013-02-03: 10 mg via INTRAVENOUS
  Filled 2013-02-03: qty 1

## 2013-02-03 MED ORDER — FUROSEMIDE 10 MG/ML IJ SOLN: 20.0000 mg | Freq: Once | INTRAMUSCULAR | Status: DC

## 2013-02-03 MED ORDER — FLUCONAZOLE 100MG IVPB: 100.0000 mg | INTRAVENOUS | Status: AC

## 2013-02-03 MED ORDER — MAGIC MOUTHWASH: 5.0000 mL | Freq: Four times a day (QID) | ORAL | Status: AC

## 2013-02-03 MED ORDER — CHLORHEXIDINE GLUCONATE 0.12 % MT SOLN: 15.0000 mL | Freq: Four times a day (QID) | OROMUCOSAL | Status: DC

## 2013-02-03 NOTE — Care Management Note (Signed)
02/03/2013 Noted that family requesting extended stay and GI consult. Please be aware that HH, IV meds and IV fluids have been arranged and a delay in d/c is not necessary due to IV medications being arranged. Will continue to follow and assist with d/c needs as requested.  [facility] agency providing services for this pt has been notified of delay in discharge.  Johny Shock RN MPH, case manager, [phone]

## 2013-02-03 NOTE — Progress Notes (Signed)
Speech Language Pathology Treatment: Dysphagia  Patient Details Name: Dawn Barrett MRN: [id number] DOB: Jul 11, 1918 Today's Date: 02/03/2013 Time: 4098-1191 SLP Time Calculation (min): 4 min  Assessment / Plan / Recommendation Clinical Impression  F/u for dysphagia.  Pt's caregiver, Elane Fritz, present.  Oral care provided prior to my arrival.  Pt alert, accepting boluses of Ensure pudding when offered.  However, pt presented with consistent struggle to swallow: each bolus was followed by wet, palpable congestion at level of larynx, throat-clearing, and eventual regurgitation.  Successive attempts at feeding resulted in worsening symptoms and required oral suctioning to remove material.   Pt appears to be unable to consume the most conservative POs without regurgitation and aspiration due to chronic but deteriorating esophageal and pharyngeal function.   Pt's caregiver, Elane Fritz, expressed grave concerns about pt going home today under these circumstances. Elane Fritz is worried that she will bear the burden of feeding Dawn Barrett when she is so clearly not able to functionally eat.  Family not present today.  Elane Fritz, if caring for pt at home, would benefit from the family discussing their plan for palliation with her.     HPI HPI: Dawn Barrett is a 79 y.o. female w/ PMHx of CVA (L. MCA, 2009), COPD, HTN, CHF, HOCM, paroxysmal A-fib, symptomatic bradycardia w/ PPM, and known history of dysphagia (2/2 CVA), presents from home w/ worsening dysphagia since Friday. Patient accompanied by daughter who provided history. Dawn Barrett typically eats pureed diet and has a large appetite on most days, but since Friday, she has been eating less and has increased difficulty swallowing foods completely w/ repeated regurgitation. Since this time she has been almost completely unable to take food or liquid by mouth. She is taken care of by [facility] who started D5NS at home. The patient also has a chronic history of  aspiration. The daughter also claims her mother has been more lethargic ovr the past few days as well.      SLP Plan    continue   Recommendations Diet recommendations: Dysphagia 1 (puree);Pudding-thick liquid Liquids provided via: Teaspoon Medication Administration: Crushed with puree Supervision: Staff to assist with self feeding;Full supervision/cueing for compensatory strategies Compensations: Slow rate;Small sips/bites;Multiple dry swallows after each bite/sip Postural Changes and/or Swallow Maneuvers: Seated upright 90 degrees;Upright 30-60 min after meal              Oral Care Recommendations: Oral care before and after PO Follow up Recommendations: 24 hour supervision/assistance        Blenda Mounts Laurice 02/03/2013, 11:27 AM

## 2013-02-03 NOTE — Progress Notes (Signed)
Nutrition Brief Note  Chart reviewed. Patient's nutrition goal of care is comfort feeds. No further nutrition interventions warranted at this time.  Please re-consult as needed.   Maureen Chatters, RD, LDN Pager #: [phone] After-Hours Pager #: [phone]

## 2013-02-03 NOTE — Progress Notes (Signed)
    Day 6 of stay      Patient name: Dawn Barrett  Medical record number: [id number]  Date of birth: 06/30/1918  89F with chronic dysphagia, getting worse. Treated for Aspiration pneumonia and being treated for presumed esophageal candidiasis. Today she looks better than most days I have had her. She responded to my calling her name. She is still gurgly in her lungs, but overall her lungs are clearer than before.   She has been seen by SLP and she remains a high risk for dysphagia. They have recommended puree and pudding thick liquids. My team, and Dr. Phillips Odor have been constantly in touch with the daughter [facility]) and it is clear that the family does not want any feeding tubes. Unfortunately, there is not much that we can offer this patient at this point. Dr. Phillips Odor also asked about a repeat GI evaluation, which is unlikely to fix the patient, however, the family declines so far. Dr. Phillips Odor is going to discuss this with the daughter one more time.   Today, we are getting ready to discharge the patient pending the family's understanding of the consequences. It has been made clear to the daughter that the patient will remain a definite aspiration risk, and the feeding are purely for taste and comfort. The patient is not going to sustain for long like this, and it is best that comfort care is instituted for her, however, Dr. Phillips Odor tells me that the family has had a previous worse experience with hospice care and is not ready for the same again. Dr. Phillips Odor and I discussed this case over the phone today, and she will one more time have a discussion with the daughter about this difficult decision.   I have seen and evaluated this patient and discussed it with my IM resident team.  Please see the rest of the plan per resident note from today.   Tally Mattox 02/03/2013, 2:10 PM.

## 2013-02-03 NOTE — Discharge Summary (Signed)
Name: Dawn Barrett MRN: [id number] DOB: 10-Feb-1919 79 y.o. PCP: [facility]  Date of Admission: 01/28/2013 12:12 PM Date of Discharge: 02/05/2013 Attending Physician: Aletta Edouard, MD  Discharge Diagnosis: 1. Failure to thrive in an adult 2. Severe dysphagia, history of aspiration pneumonia 3. Elevated INR 4. Heart failure with mild depression of ejection fraction, HOCM 5. Chronic right shoulder wound 6. Stage 2 sacral ulcer  Discharge Medications:   Medication List    STOP taking these medications       amiodarone 100 MG tablet  Commonly known as:  PACERONE     cholecalciferol 1000 UNITS tablet  Commonly known as:  VITAMIN D     furosemide 20 MG tablet  Commonly known as:  LASIX     nitroGLYCERIN 0.4 MG SL tablet  Commonly known as:  NITROSTAT     PROBIOTIC PO     spironolactone 25 MG tablet  Commonly known as:  ALDACTONE     VITAMIN B-12 PO     vitamin C 500 MG tablet  Commonly known as:  ASCORBIC ACID     warfarin 1 MG tablet  Commonly known as:  COUMADIN      TAKE these medications       chlorhexidine 0.12 % solution  Commonly known as:  PERIDEX  15 mLs by Mouth Rinse route 4 (four) times daily.     dextrose 5 % and 0.45% NaCl infusion  Inject 20 mL/hr into the vein continuous.     fluconazole 2 mg/mL Soln IVPB  Commonly known as:  DIFLUCAN  Inject 50 mLs (100 mg total) into the vein daily.     levalbuterol 0.63 MG/3ML nebulizer solution  Commonly known as:  XOPENEX  Take 3 mLs (0.63 mg total) by nebulization every 6 (six) hours as needed for wheezing.     magic mouthwash Soln  Take 5 mLs by mouth 4 (four) times daily.        Disposition and follow-up:   Dawn Barrett was discharged from [facility] in Stable condition.  At the hospital follow up visit please address:  1.  Dermatology follow up if family wishes. Hospice follow up if family wishes.  2.  Labs / imaging needed at time of  follow-up: None  3.  Pending labs/ test needing follow-up: None  Follow-up Appointments: Follow-up Information   Follow up with Default, Provider, MD. (Please follow up with [facility] in the next week.)    Contact information:   [address] [place] [address] [phone]       Discharge Instructions: Discharge Orders   Future Orders Complete By Expires   Diet - low sodium heart healthy  As directed    Diet - low sodium heart healthy  As directed    Increase activity slowly  As directed    Increase activity slowly  As directed       Consultations: Treatment Team:  Palliative Forde Dandy, PA-C Hilarie Fredrickson, MD  Procedures Performed:  Dg Chest 2 View  01/28/2013   CLINICAL DATA:  Respiratory distress.  EXAM: CHEST  2 VIEW  COMPARISON:  January 11, 2013.  FINDINGS: Stable cardiomegaly. Bilateral pleural effusions are again noted. Left-sided pacemaker is unchanged in position. Severe thoracic kyphosis is noted with stable compression deformity of mid thoracic vertebral body. Bilateral basilar opacities are noted concerning for atelectasis or other consolidation. No pneumothorax is noted.  IMPRESSION: Stable cardiomegaly and  bilateral pleural effusions, with associated bilateral basilar atelectasis or consolidation.   Electronically Signed   By: Roque Lias M.D.   On: 01/28/2013 13:45   Dg Chest 2 View  01/11/2013   CLINICAL DATA:  Dysphagia  EXAM: CHEST  2 VIEW  COMPARISON:  12/22/2012  FINDINGS: Persistent moderate pleural effusions with compressive basilar atelectasis/ consolidation. Difficult to exclude pneumonia. Heart is enlarged. Left subclavian pacer evident. Atherosclerosis of the aorta. Degenerative changes of the spine. Chronic mid thoracic compression fracture. Increased thoracic kyphosis noted.  IMPRESSION: Persistent cardiomegaly with bilateral pleural effusions and basilar atelectasis/consolidation. No significant interval change.    Electronically Signed   By: Ruel Favors M.D.   On: 01/11/2013 13:36   Ct Head Wo Contrast  01/11/2013   CLINICAL DATA:  Difficulty swallowing since Friday, evaluate for CVA, history of prior CVA  EXAM: CT HEAD WITHOUT CONTRAST  TECHNIQUE: Contiguous axial images were obtained from the base of the skull through the vertex without intravenous contrast.  COMPARISON:  12/04/2012; 10/27/2007  FINDINGS: Grossly unchanged sequela of remote left-sided MCA distribution infarction with associated geographic encephalomalacia and a punctate area of dystrophic calcification within the prior infarct. Grossly unchanged findings of atrophy with sulcal prominence. Grossly unchanged rather extensive periventricular hypodensities are grossly unchanged in compatible microvascular ischemic disease. Bilateral basal ganglia calcifications. Given extensive background parenchymal abnormalities, there is no CT evidence of acute large territory infarct. No intraparenchymal or extra-axial mass or hemorrhage. Unchanged size and configuration of the ventricles and basilar cisterns including mild ex vacuo dilatation of the left lateral ventricle. No midline shift. Intracranial atherosclerosis. Limited visualization there is an air-fluid level within the right maxillary sinus. The remaining paranasal sinuses and mastoid air cells are normally aerated. Post bilateral cataract surgery. Regional soft tissues are normal. No displaced calvarial fracture.  IMPRESSION: 1. No acute intracranial process. 2. Stable sequela of remote left-sided MCA distribution infarct. 3. Grossly stable findings of atrophy and advanced microvascular ischemic disease. 4. Air-fluid level within the right maxillary sinus, nonspecific though could be seen in the setting of acute sinusitis.   Electronically Signed   By: Simonne Come M.D.   On: 01/11/2013 18:41   Dg Chest Port 1 View  01/30/2013   CLINICAL DATA:  Short of breath  EXAM: PORTABLE CHEST - 1 VIEW  COMPARISON:   01/28/2013  FINDINGS: Bilateral effusions and bibasilar atelectasis unchanged. Chronic lung disease is noted.  Right upper lobe airspace disease appears more prominent, possible pneumonia  IMPRESSION: Possible right upper lobe pneumonia or edema.  No change bibasilar atelectasis and effusion.   Electronically Signed   By: Marlan Palau M.D.   On: 01/30/2013 13:08    Admission HPI:  Ms. SHEKELIA BOUTIN is a 79 y.o. female w/ PMHx of CVA (L. MCA, 2009), known history of dysphagia (2/2 CVA), dementia, COPD, HTN, CHF (EF 40-45% in 9/14) and HOCM, paroxysmal A-fib, symptomatic bradycardia w/ PPM, and recent admission for dysphasia in October, presents from home with worsening dysphagia and course breathing. She is accompanied by her two caretakers who provided the history.   According to her caretakers, Ms. Chancy typically eats pureed diet and has a large appetite on most days, but since Tuesday, she has been eating less and has increased difficulty swallowing foods completely w/ repeated regurgitation. Since this time she has been almost completely unable to take food or liquid by mouth. She developed increasing difficulty breathing and audible ronchi. Caretakers deny fevers, chills, vomiting, diarrhea, dark or tarry  stools. Earlier today she went to a dermatology appointment for workup of a wound on her right shoulder, and the physician there suggested she go to the emergency department for further care.   In terms of mental status, at baseline she is able to give yes or no answers to questions and keeps her head upright. Since Tuesday she has been more slumped in bed and less responsive to questions, per caretakers. However they do think she is doing much better since initially presenting to the emergency department.   The patient has a chronic history of aspiration. The patient is DO NOT RESUSCITATE per her records and report from her caretakers. Patient had a very similar admission on 01/11/2013  for acute on chronic dysphasia. GI was consulted and felt she had oral candidiasis. She was treated with Diflucan IV through a PICC line for 10 days. Patient was also previously admitted on 12/04/12 for acute on chronic respiratory failure most likely d/t aspiration pneumonia, acute encephalopathy, and dehydration. Seen by Dr. Juanda Chance at this time to discuss PEG vs PICC placement. PICC was placed, PEG was refused, and patent was discharged. Daughter refused palliative care consult during this admission.   In the ED she was started on vancomycin and cefepime.  Physical Exam:  Blood pressure 124/88, pulse 73, temperature 98.9 F (37.2 C), temperature source Axillary, resp. rate 23, SpO2 100.00%.  Physical Exam  Constitutional: She appears cachectic.  Slumped forward in bed with pillow under chin. Not in distress.  HENT:  Head: Normocephalic and atraumatic.  Cardiovascular: Normal rate, regular rhythm and normal heart sounds. Exam reveals no gallop and no friction rub.  No murmur heard.  Pulmonary/Chest: No accessory muscle usage. She has rhonchi (Course, heard in all lung fields.). She has rales (In lower lung fields bilaterally).  Audible ronchorous breathing. PICC in place at LUE.  Abdominal: Soft. There is no tenderness.  Hypoactive bowel sounds  Musculoskeletal: She exhibits edema (BL lower extremities).  Neurological: She is alert.  Nods yes and no to some questions, gave a soft, indecipherable vocalization to "where are you"  Skin:     Hospital Course by problem list:  1. Failure to thrive in an adult - Patient with known dysphasia and multiple admissions for aspiration and aspiration pneumonia. She is cachectic with severe protein calorie malnutrition, as evidenced by an albumin of 1.9. PICC line in place. During her previous hospitalization in October, the family did not want to pursue a PEG tube but also declined palliative care consult. This admission, family did agree to a  Palliative care consult, and Dr. Phillips Odor has been meeting with the family. They are not interested in hospice - they embrace the philosophy of "never giving up" - and would like to continue with their team of paid caregivers who provide in home care. Their goals are for her to swallow again, avoid hospitalization, and go home when medically stable or maximized. Speech therapy saw the patient and recommended continuation of NPO status due to persistent dysphagia and risk for aspiration pneumonia. However family would like to provide comfort feeds. Patient will be discharged with a diet recommendation for Dysphagia 1 (puree);Pudding-thick liquid after extensive counseling with the family on inevitable aspiration. We provided gentle D5 1/2NS @ 10-20 ml/hr while she was NPO here. We made it clear to the patient's daughter and HCPOA Rod Holler) that the patient will remain a definite aspiration risk, and feeding will be purely for taste and comfort. The patient is not going to sustain  for long like this, we felt it would be best that comfort care is instituted for her, however the family has had a previous bad experience with hospice care and is not ready for it again. Patient was discharged home with caregivers in stable condition, with a [facility] outpatient referral.  2. Severe dysphagia, history of aspiration pneumonia - Patient w/ known history of dysphagia 2/2 CVA in 2009, with history of recurrent aspiration pneumonia. CXR performed on presentation with persistent cardiomegaly with bilateral pleural effusions and basilar atelectasis/consolidation which are chronic findings. Repeat CXR unchanged. We gave 3 days of IV clindamycin, but decided to discontinue this because she was afebrile, without leukocytosis, and C. Difficile infection would be miserable for her. She has a history of thrush, so IV fluconazole was started for a planned 14 day course. She improved clinically with these therapies,  and over the course of her admission we saw improvement in her mental status. As we approached discharge, patient was very alert in light of her deconditioned and fragile state and did not appear to be in distress. This seemed to motivate the family towards aggressive treatment despite the fact that we had very little additional to offer from a medical standpoint to restore her to health. Nonetheless, we obtained GI consult per family's wishes. Dr. Marina Goodell saw the patient and spoke with her daughter. He felt there was nothing to offer that would improve the patient's swallowing, and anticipated ongoing aggressive decline with time. Family expressed understanding. We worked with care management for her home health equipment needs, and I have provided hard scripts upon discharge for fluids, fluconazole, Magic Mouthwash, and Peridex mouth swabs. I also recommended they continue home Xopenex nebs q4h as she appears to benefit from these treatments symptomatically. Oral care/ deep suctioning will also be important. In collaboration with the family and Dr. Phillips Odor, we have discontinued all of her other PO medications including lasix and coumadin.  3. Elevated INR - INR was elevated on 12/5 to 7.2, confirmed by repeat testing. Hepatic function panel was normal. Her last dose of warfarin was 2 days prior to this lab finding. There were no signs/symptioms of bleeding. She received vitamin K 2.5mg  IV with repeat INR decreased to 1.71. Repeat INR 1.26 this am. We will discontinue the coumadin at discharge since risks outweigh benefits in this frail patient, and she cannot swallow it.  INR   Date  Value  Range  Status   01/31/2013  1.26  0.00 - 1.49  Final   01/30/2013  1.71*  0.00 - 1.49  Final   01/30/2013  7.21*  0.00 - 1.49  Final   CRITICAL RESULT CALLED TO, READ BACK BY AND VERIFIED WITH:   WELLINGTON N RN 01/30/13 1144 COSTELLO B   01/30/2013  7.63*  0.00 - 1.49  Final   CRITICAL RESULT CALLED TO, READ BACK BY AND  VERIFIED WITH:   Nida Boatman N RN 01/30/13 0802 COSTELLO B   REPEATED TO VERIFY    4. Heart failure with mild depression of ejection fraction, HOCM - EF 40-45% in 9/14. We held her Lasix given NPO. She was clinically euvolemic at discharge.  5. Chronic right shoulder wound - No signs or symptoms of infection or cellulitis. Per caretakers, the dermatologist was concerned that this represented a squamous cell carcinoma. We obtained a wound care consult and they recommended foam dressings to protect and promote healing. Family can pursue outpatient dermatology followup if they wish.  6. Stage 2  sacral ulcer - Evaluated by wound care who are familiar with the patient and note improvement in appearance of this ulcer. Recommended foam dressing to protect and absorb drainage.   Discharge Vitals:   BP 148/67  Pulse 60  Temp(Src) 97.3 F (36.3 C) (Oral)  Resp 18  Ht 5\' 5"  (1.651 m)  Wt 123 lb 14.4 oz (56.201 kg)  BMI 20.62 kg/m2  SpO2 100%  Discharge Labs:  CMP     Component Value Date/Time   NA 140 01/31/2013 0500   K 3.4* 01/31/2013 0500   CL 103 01/31/2013 0500   CO2 34* 01/31/2013 0500   GLUCOSE 86 01/31/2013 0500   BUN 18 01/31/2013 0500   CREATININE 0.47* 01/31/2013 0500   CREATININE 0.95 11/24/2012 2030   CALCIUM 7.6* 01/31/2013 0500   PROT 6.0 01/30/2013 1110   ALBUMIN 1.6* 01/30/2013 1110   AST 33 01/30/2013 1110   ALT 21 01/30/2013 1110   ALKPHOS 203* 01/30/2013 1110   BILITOT 0.5 01/30/2013 1110   GFRNONAA 83* 01/31/2013 0500   GFRAA >90 01/31/2013 0500    CBC    Component Value Date/Time   WBC 5.2 01/31/2013 0500   RBC 2.80* 01/31/2013 0500   HGB 8.6* 01/31/2013 0500   HCT 27.4* 01/31/2013 0500   PLT 127* 01/31/2013 0500   MCV 97.9 01/31/2013 0500   MCH 30.7 01/31/2013 0500   MCHC 31.4 01/31/2013 0500   RDW 16.8* 01/31/2013 0500   LYMPHSABS 1.6 01/30/2013 0600   MONOABS 0.4 01/30/2013 0600   EOSABS 0.0 01/30/2013 0600   BASOSABS 0.0 01/30/2013 0600    Signed: Vivi Barrack,  MD 02/05/2013, 7:08 AM   Time Spent on Discharge: 30 minutes Services Ordered on Discharge: Outpatient palliative care referral Equipment Ordered on Discharge: Home health IV medications

## 2013-02-03 NOTE — Consult Note (Signed)
[facility] Consult: 3:51 PM 02/03/2013  LOS: 6 days    Referring Provider: Dalphine Handing Primary Care Physician:  Care through [facility].  Primary Gastroenterologist:  Dr. Juanda Chance: seen only at hospitalization in Oct 2014.   Reason for Consultation:  Family asks is anything else can be done re dysphagia.    HPI: Dawn Barrett is a 79 y.o. female.  Bedbound for many months. Chronic failure to thrive and malnutrition. Dysphagia since suffering CVA around 2009. Hx PAF.  Coumadin discontinued in 11/2012 after admission with ARF, hypotension, INR >10.0 but later restarted and again on hold.   [facility] pt who was evaluated for dysphagia by Dr Juanda Chance during inpt stay 12/03/12 - 12/11/12.  Failed at least 2 swallow studies at [facility] prior to that.  12/06/12 bedside swallow with overt aspiration of pudding textures. Diet relegated to D1 with thick liquids.  Family decided aganst PEG placement as pt/family still wanted to pursue comfort oral intake. Dr Juanda Chance stated that "the advantages of a PEG in this situation are small compared with the risk of the placing of the PEG (would need Propofol anesthesia)"  A PICC was placed to allow for home administration of IVF.  Pt has called hospice "the death squad" but she is DNR.  Treated with single dose of Diflucan for oral thrush (dtr described this, not diagnosed by MD) as outpt in early November 2014.  Seen during brief mid November admission by Dr Elnoria Howard and Christella Hartigan re: regurgitation and recent thrush.  At that time she had aspiration pna. Head CT showed old left sided CVA, atrophy, microvascular disease.  Dr Larae Grooms initiated 10 day course of IV diflucan to treat possible esophageal candidiasis.  Family still wanted pt to take po's.  Pt's caregiver says swallow improved but not until  near or after completion   Readmitted 12/3 to present with diminished po intake, diminished mental status, increased dysphagia, increased regurgitation to point of unable to take po's of any texture. Also developed increased dyspnea and ronchi.  CXR with stable bil pleural effusions, atx and/or consolidation.  Repeat CXR with possible RUL PNA. Anti microbials include on going IV Diflucan for presumed esophageal candidiasis; she completed 3 days of Clindamycin.   Family goals are for pt to recover her ability to swallow, and provide comfort feeds.  They have softened views and have allowed involvement of Palliative care but not hospice.     Past Medical History  Diagnosis Date  . Stroke     a. L MCA CVA 2009.  Marland Kitchen Hypertension   . COPD (chronic obstructive pulmonary disease)   . Paroxysmal atrial fibrillation   . NSTEMI (non-ST elevated myocardial infarction)     a. 10/2012 - managed medically.  . Chronic combined systolic and diastolic CHF (congestive heart failure)   . Symptomatic bradycardia     a. s/p St. Jude pacemaker 2009.  Marland Kitchen PNA (pneumonia)     a. Per notes, h/o pulm injury r/t PNA.  aspiration PNA 11/2012  . LBBB (left bundle branch block)   . HOCM (  hypertrophic obstructive cardiomyopathy)     a. 2D Echo 10/2012: EF 40-45%, mild LVH, mild-mod TR, mitral SAM with mod MR, LVOT gradient not well assessed but at least 2.19m/s; findings c/w HOCM.  Marland Kitchen Mitral regurgitation     a. Mod MR by echo 10/2012.  . Tricuspid regurgitation     a. Mild-mod TR by echo 10/2012.  Marland Kitchen Dysphagia     Past Surgical History  Procedure Laterality Date  . Pacemaker insertion  11/19/2007    St. Jude Zephyr 5020 pulse generator, serial [id number]  . Femur surgery Right 06/2011    Rod inserted after a fall.     Prior to Admission medications   Medication Sig Start Date End Date Taking? Authorizing Provider  levalbuterol Pauline Aus) 0.63 MG/3ML nebulizer solution Take 3 mLs (0.63 mg total) by nebulization every 6  (six) hours as needed for wheezing. 11/21/12  Yes [name], PA-C  Alum & Mag Hydroxide-Simeth (MAGIC MOUTHWASH) SOLN Take 5 mLs by mouth 4 (four) times daily. 02/03/13   Vivi Barrack, MD  chlorhexidine (PERIDEX) 0.12 % solution 15 mLs by Mouth Rinse route 4 (four) times daily. 02/03/13   Vivi Barrack, MD  Dextrose-Sodium Chloride (DEXTROSE 5 % AND 0.45% NACL) infusion Inject 20 mL/hr into the vein continuous. 02/03/13   Vivi Barrack, MD  fluconazole (DIFLUCAN) 2 mg/mL SOLN IVPB Inject 50 mLs (100 mg total) into the vein daily. 02/03/13 02/14/13  Vivi Barrack, MD    Scheduled Meds: . antiseptic oral rinse  15 mL Mouth Rinse q12n4p  . chlorhexidine  15 mL Mouth Rinse QID  . fluconazole (DIFLUCAN) IV  100 mg Intravenous Q24H  . furosemide  10 mg Intravenous Once  . levalbuterol  0.63 mg Nebulization TID  . magic mouthwash  5 mL Oral QID  . neomycin-bacitracin-polymyxin   Topical Daily   Infusions: . dextrose 5 % and 0.45% NaCl 20 mL/hr at 01/30/13 2114   PRN Meds: levalbuterol, polyvinyl alcohol, sodium chloride, sodium chloride   Allergies as of 01/28/2013 - Review Complete 01/28/2013  Allergen Reaction Noted  . Codeine Other (See Comments)   . Levaquin [levofloxacin in d5w]  01/28/2013  . Sulfonamide derivatives Other (See Comments)     Family History  Problem Relation Age of Onset  . Heart disease Mother     History   Social History  . Marital Status: Widowed    Spouse Name: N/A    Number of Children: N/A  . Years of Education: N/A   Occupational History  . Retired    Social History Main Topics  . Smoking status: Never Smoker   . Smokeless tobacco: Not on file  . Alcohol Use: No  . Drug Use: No  . Sexual Activity: Not on file   Other Topics Concern  . Not on file   Social History Narrative   Pt lives with daughter in [place] but has been in [place] recently.    REVIEW OF SYSTEMS: Constitutional:  Non ambulatory.  ENT:  No nose bleeds Pulm:  Per  HPI CV:  No palpitations, no LE edema.  GU:  No hematuria, no frequency GI:  Per HPI Heme:   Chronic anemia, baseline Hgb ~ 10.0  Transfusions:  None recorded in Epic.  Neuro:  No headaches, no peripheral tingling or numbness Derm:  No itching, no rash or sores.  Endocrine:  No sweats or chills.  No polyuria or dysuria Immunization:  Not outlined.  Travel:  None beyond local counties in last few  months.    PHYSICAL EXAM: Vital signs in last 24 hours: Filed Vitals:   02/03/13 1210  BP: 161/65  Pulse: 77  Temp: 98.6 F (37 C)  Resp: 18   Wt Readings from Last 3 Encounters:  02/01/13 56.201 kg (123 lb 14.4 oz)  01/11/13 49.499 kg (109 lb 2 oz)  12/09/12 65 kg (143 lb 4.8 oz)    General: Frail, aged WF who is comfortable and not toxic Head:  No asymmetry, no swelling  Eyes:  No icterus or pallor.  Arcus senilis.  Ears:  Slightly HOH  Nose:  No discharge Mouth:  Moist oral MM.  Slight, non-specific white coating on mouth.  Not obviously candida.  Pharynx is very well seen and no thrush seen there Neck:  No JVD or mass.  Lungs:  Clear in front.  No ronchi. Heart: RRR.  No MRG Abdomen:  Soft, NT, ND.  No mass or HSM.  Active BS.   Rectal: not done   Musc/Skeltl: marked kyphosis especially at C spine Extremities:  + LE edema, reaches to the thighs.  1 plus pitting.  Sores on legs covered with dressing  Neurologic:  Appropriate, follows commands.   Skin:  Thin, especially in the legs Tattoos:  none Nodes:  No cervical adenopathy.   Psych:  Cooperative, relaxed.   Intake/Output from previous day: 12/08 0701 - 12/09 0700 In: 263.7 [I.V.:213.7; IV Piggyback:50] Out: -  Intake/Output this shift:    LAB RESULTS: No results found for this basename: WBC, HGB, HCT, PLT,  in the last 72 hours BMET Lab Results  Component Value Date   NA 140 01/31/2013   NA 141 01/30/2013   NA 138 01/28/2013   K 3.4* 01/31/2013   K 3.6 01/30/2013   K 4.7 01/28/2013   CL 103 01/31/2013   CL 104  01/30/2013   CL 101 01/28/2013   CO2 34* 01/31/2013   CO2 34* 01/30/2013   CO2 31 01/28/2013   GLUCOSE 86 01/31/2013   GLUCOSE 89 01/30/2013   GLUCOSE 124* 01/28/2013   BUN 18 01/31/2013   BUN 24* 01/30/2013   BUN 25* 01/28/2013   CREATININE 0.47* 01/31/2013   CREATININE 0.55 01/30/2013   CREATININE 0.58 01/28/2013   CALCIUM 7.6* 01/31/2013   CALCIUM 8.1* 01/30/2013   CALCIUM 8.6 01/28/2013   LFT No results found for this basename: PROT, ALBUMIN, AST, ALT, ALKPHOS, BILITOT, BILIDIR, IBILI,  in the last 72 hours PT/INR Lab Results  Component Value Date   INR 1.26 01/31/2013   INR 1.71* 01/30/2013   INR 7.21* 01/30/2013   RADIOLOGY STUDIES: No results found.  ENDOSCOPIC STUDIES: Nothing in [place].   IMPRESSION:   *  Chronic dysphagia, recurrent aspiration PNA.  Not much improvement withy current course of empiric Diflucan and not convinced she has esophageal candidiasis.  Dysphagia is longstanding.  Family has consistently wanted pt to take po's and decided against PEG on numerous occasions in past.   *  Normocytic anemia.   *  Protein calorie malnutrition.   *  Hx PAF.  Coumadin on hold currently as INR was 7.2 four days ago.  Had been discontinued for a period of time in October due to INR >10.    PLAN:     *  Per Dr Marina Goodell, he will see pt 12/10    Jennye Moccasin  02/03/2013, 3:51 PM Pager: [phone]  GI ATTENDING  History, laboratories, x-rays, prior GI consultations reviewed. Patient personally seen and examined. Sports coach  in room. Agree with H&P as outlined above. Unfortunately, frail elderly female with progressive overall decline in health. Neurogenic dysphasia from prior strokes. History of aspiration secondary to the same. Previously seen regarding swallowing difficulties. PEG not recommended previously. Treated with Diflucan for thrush. Transient short-lived improvement. I had an extensive telephone discussion today with the patient's daughter. Unfortunately, there  is nothing to offer that would improve the patient's swallowing. I anticipate ongoing aggressive decline with time. Supportive and comfort care recommended. She understood and expressed appreciation. Will sign off  Sebastian Dzik N. Eda Keys., M.D. [facility] Division of Gastroenterology

## 2013-02-03 NOTE — Progress Notes (Addendum)
Palliative Medicine Team Progress Note (Late Entry)  Unfortunately Mrs. Mckone has taking another decline today with acute onset hypoxia this afternoon-sats in low 80's.Marland Kitchen Her lungs have new crackles throughout. I discontinued her Lasix given her NPO status and ensuing dehydration-she was getting a very low KVO of D51/2NS but with her low albumin, existing propensity for CHF she may be having difficulty managing even small amounts of IV fluids as her body declines. She has terminal dysphagia and in order to meet families goals to treat aggressively we started treatment with IV Fluconazole. The fact that Mrs. How is very alert given her deconditioned and fragile state and does not appear to be in distress motivates the family towards aggressive treatment despite the fact that we have very little additional to offer from a medical standpoint to restore her to health. Her daughter knows that her mother is facing EOL-but embraces teh philosophy of "never giving up"- we are trying to support that as much as possible. I recommended a one time dose of Lasix earlier which seems to have helped on re-evaluation this evening. The patient took a few sips of ginger ale I gave her carefully and she seemed very satisfied and comforted by this. No doubt this is mostly esophageal-she seems to have a stricture or complete dysmotility- food and saliva are just collecting and coming back up intermittently. No signs of aspiration such as fever or hypotension.   I have discussed her condition with Leretha Dykes will be here later this evening- for now I suggested to try Lasix-mostly for her comfort and to consider stopping IV fluids and continuing a focus on comfort-little to be gained from GI eval unless they desire more aggressive interventions such as endoscopy or feeding tube which may not be worth the risks. I again reiterated to Rod Holler that she can choose how she wants her mother's remaining time to be and we will  do our best to support her in achieving her goals.  35 minutes. Greater than 50%  of this time was spent counseling and coordinating care related to the above assessment and plan.    Anderson Malta, DO Palliative Medicine

## 2013-02-03 NOTE — Progress Notes (Signed)
Subjective: Patient seen and examined at the bedside. She looks great. Her breathing is not labored. She follows simple commands ("open your mouth"). She answers yes or no questions with nodding.  Objective: Vital signs in last 24 hours: Filed Vitals:   02/02/13 2059 02/03/13 0539 02/03/13 0834 02/03/13 0944  BP:  156/76 173/84   Pulse:  71 73   Temp:  98.1 F (36.7 C) 97.2 F (36.2 C)   TempSrc:  Oral Axillary   Resp:  18 18   Height:      Weight:      SpO2: 98% 92% 91% 92%   Weight change:   Intake/Output Summary (Last 24 hours) at 02/03/13 1204 Last data filed at 02/03/13 0835  Gross per 24 hour  Intake 263.67 ml  Output      0 ml  Net 263.67 ml   Vitals reviewed. General: resting in bed comfortably, cachetic.  HEENT: No scleral icterus, MM Cardiac: RRR, no rubs, murmurs or gallops Pulm: No respiratory distress, scattered rhonchi with bibasilar rales.  Chest: PICC in place at LUE.  Abd: soft, nontender, nondistended, BS present Ext: warm and well perfused, no pedal edema Neuro: Alert, responsive, following commands  Lab Results: Basic Metabolic Panel:  Recent Labs Lab 01/30/13 0600 01/31/13 0500  NA 141 140  K 3.6 3.4*  CL 104 103  CO2 34* 34*  GLUCOSE 89 86  BUN 24* 18  CREATININE 0.55 0.47*  CALCIUM 8.1* 7.6*  MG 2.1  --   PHOS 3.1  --    Liver Function Tests:  Recent Labs Lab 01/28/13 1900 01/30/13 1110  AST 45* 33  ALT 22 21  ALKPHOS 239* 203*  BILITOT 0.7 0.5  PROT 7.5 6.0  ALBUMIN 2.0* 1.6*    Recent Labs Lab 01/30/13 0600 01/31/13 0500  WBC 6.7 5.2  NEUTROABS 4.6  --   HGB 8.4* 8.6*  HCT 26.1* 27.4*  MCV 98.1 97.9  PLT 122* 127*    BNP:  Recent Labs Lab 01/28/13 1227  PROBNP 2180.0*   Coagulation:  Recent Labs Lab 01/30/13 0600 01/30/13 1110 01/30/13 2300 01/31/13 1110  LABPROT 61.4* 58.7* 19.6* 15.5*  INR 7.63* 7.21* 1.71* 1.26   Urinalysis:  Recent Labs Lab 01/28/13 1444  COLORURINE AMBER*    LABSPEC 1.020  PHURINE 5.0  GLUCOSEU NEGATIVE  HGBUR NEGATIVE  BILIRUBINUR NEGATIVE  KETONESUR 15*  PROTEINUR NEGATIVE  UROBILINOGEN 1.0  NITRITE NEGATIVE  LEUKOCYTESUR NEGATIVE    Micro Results: Recent Results (from the past 240 hour(s))  URINE CULTURE     Status: None   Collection Time    01/28/13  2:44 PM      Result Value Range Status   Specimen Description URINE, RANDOM   Final   Special Requests NONE   Final   Culture  Setup Time     Final   Value: 01/28/2013 15:30     Performed at [facility] Count     Final   Value: >=100,000 COLONIES/ML     Performed at [facility]   Culture     Final   Value: Multiple bacterial morphotypes present, none predominant. Suggest appropriate recollection if clinically indicated.     Performed at [facility]   Report Status 01/29/2013 FINAL   Final   Studies/Results: No results found. Medications: I have reviewed the patient's current medications. Scheduled Meds: . antiseptic oral rinse  15 mL Mouth Rinse q12n4p  . chlorhexidine  15  mL Mouth Rinse QID  . fluconazole (DIFLUCAN) IV  100 mg Intravenous Q24H  . levalbuterol  0.63 mg Nebulization TID  . magic mouthwash  5 mL Oral QID  . neomycin-bacitracin-polymyxin   Topical Daily   Continuous Infusions: . dextrose 5 % and 0.45% NaCl 20 mL/hr at 01/30/13 2114   PRN Meds:.levalbuterol, polyvinyl alcohol, sodium chloride, sodium chloride Assessment/Plan: Dawn Barrett is a 79 y.o. woman w/ PMHx of CVA (L. MCA, 2009), known history of dysphagia (2/2 CVA), dementia, COPD, HTN, CHF (EF 40-45% in 9/14) and HOCM, paroxysmal A-fib, symptomatic bradycardia w/ PPM, and recent admission for aspiration pneumonia in October, presents with worsening dysphagia and course breathing x1 day.   #Failure to thrive - She has known dysphasia and multiple admissions for aspiration and aspiration pneumonia. She is cachectic with severe protein calorie  malnutrition, as evidenced by an albumin of 1.9. PICC line in place. During previous hospitalization in October the family did not want to pursue a PEG or palliative care consult. Speech therapy has seen the patient and recommends continuation of NPO status due to persistent dysphagia and risk for aspiration pneumonia. Palliative care has been meeting with the family. Family is not interested in hospice and would like to continue with their team of paid caregivers who provide in home care. Goals are for her to swallow again-avoid hospitalization-and go home when medically stable or maximized. They would like to provide comfort feeds, so patient will be discharged with a diet recommendation for Dysphagia 1 (puree);Pudding-thick liquid. - D5 1/2NS @ 10-20 ml/hr  - Palliative care and SLP consulted, appreciate help with this patient - Family would like outpatient palliative referral > [facility] has [facility] to [facility] - Patient medically stable for discharge home with caregivers today  #Severe dysphagia, possible aspiration pneumonia - Patient w/ known history of dysphagia 2/2 CVA in 2009, witth history of recurrent aspiration pneumonia. CXR performed on presentation with persistent cardiomegaly with bilateral pleural effusions and basilar atelectasis/consolidation which are chronic findings. Repeat CXR unchanged. She has improved clinically with improvement in her mental status.  - D/c Clindamycin as she is afebrile, no leukocytosis, repeat CXR unchanged, and C. Diff infection would be miserable for her - Continue fluconazole 100mg  IV daily for presumed thrush, 14 day course - Care management consult for equipment needs > Will provide hard scripts for fluids and fluconazole  - Continue home Xopenex nebs q4h  - Strict bed rest  - Oral care/ deep suctioning   #Elevated INR -  Resolved. INR elevated on 12/5 to 7.2, confirmed by repeat testing. Hepatic function panel normal, last  dose of warfarin 2 days prior to this lab finding. She received vitamin K 2.5mg  IV with repeat INR decreased to 1.71 which is a significant and unexpectant decrease in the INR. Repeat INR 1.26 this am. - Continue to monitor for signs/symptioms of bleeding  INR  Date Value Range Status  01/31/2013 1.26  0.00 - 1.49 Final  01/30/2013 1.71* 0.00 - 1.49 Final  01/30/2013 7.21* 0.00 - 1.49 Final     CRITICAL RESULT CALLED TO, READ BACK BY AND VERIFIED WITH:     WELLINGTON N RN 01/30/13 1144 COSTELLO B  01/30/2013 7.63* 0.00 - 1.49 Final     CRITICAL RESULT CALLED TO, READ BACK BY AND VERIFIED WITH:     Argentina Donovan RN 01/30/13 0802 COSTELLO B     REPEATED TO VERIFY    #Heart failure with mild depression of  ejection fraction, HOCM - EF 40-45% in 9/14. - D/c Lasix since still NPO, currently clinically euvolemic  Dispo: Disposition is deferred at this time, awaiting improvement of current medical problems and palliative planning.   The patient does have a current PCP and does need an [facility] hospital follow-up appointment after discharge.  The patient does have transportation limitations that hinder transportation to clinic appointments.  .Services Needed at time of discharge: Y = Yes, Blank = No PT:   OT:   RN:   Equipment:   Other:     LOS: 6 days   Vivi Barrack, MD 02/03/2013, 12:04 PM

## 2013-02-03 NOTE — Progress Notes (Signed)
    Day 6 of stay      Patient name: Dawn Barrett  Medical record number: [id number]  Date of birth: 1918/05/20  Telephone Conversation with the daughter Dawn Barrett, who also says that she is POA. Patient's daughter understands that her mother is deteriorating and near the end. She (and the rest of the family) still do not want comfort care. She would want the patient to have a GI consult at this time, one last time, though she understands that it might have little to offer, but the family would like to have a specialist opinion. She would like to come to the hospital today from [place]. She requests for discharge tomorrow so they can arrange for IV medications to give to her mother.   Sanaa Zilberman 02/03/2013, 3:29 PM.

## 2013-02-04 DIAGNOSIS — R627 Adult failure to thrive: Secondary | ICD-10-CM

## 2013-02-04 DIAGNOSIS — B3781 Candidal esophagitis: Secondary | ICD-10-CM

## 2013-02-04 DIAGNOSIS — R1310 Dysphagia, unspecified: Secondary | ICD-10-CM

## 2013-02-04 DIAGNOSIS — E860 Dehydration: Secondary | ICD-10-CM

## 2013-02-04 MED ORDER — LEVALBUTEROL HCL 0.63 MG/3ML IN NEBU
0.6300 mg | INHALATION_SOLUTION | Freq: Two times a day (BID) | RESPIRATORY_TRACT | Status: DC
  Administered 2013-02-04 – 2013-02-05 (×2): 0.63 mg via RESPIRATORY_TRACT
  Filled 2013-02-04 (×4): qty 3

## 2013-02-04 NOTE — Progress Notes (Signed)
Family is requesting that pt not be discharged this late at night. Pt did need to receive her dose of IV Diflucan prior to discharge (which was administered at 16:45), but after IV Diflucan was given, and IV team was notified of disconnect need, the time was 18:00, and family would prefer for her to stay until tomorrow morning. MD notified.

## 2013-02-04 NOTE — Progress Notes (Signed)
Bladder scan performed because pt has not voided since 21:20 last night. Bladder scan showed 245 mL in bladder. Pt is comfortable. Will continue to monitor urine output.

## 2013-02-04 NOTE — Progress Notes (Signed)
Subjective: Patient seen and examined at the bedside. She is alert. Her breathing is not labored. She follows simple commands ("open your mouth"). She answers yes or no questions with nodding. Caretaker is concerned because she has not voided urine overnight. Bladder scan showed 245cc in bladder.  Objective: Vital signs in last 24 hours: Filed Vitals:   02/03/13 1754 02/03/13 2106 02/04/13 0606 02/04/13 0903  BP: 136/73 135/66 127/76 150/92  Pulse: 68 74 92 72  Temp: 97.6 F (36.4 C) 98.4 F (36.9 C) 98 F (36.7 C) 97.7 F (36.5 C)  TempSrc: Oral Oral Oral Axillary  Resp: 18 18 18 18   Height:      Weight:      SpO2: 97% 100% 96% 99%   Weight change:   Intake/Output Summary (Last 24 hours) at 02/04/13 1047 Last data filed at 02/04/13 0904  Gross per 24 hour  Intake      0 ml  Output      0 ml  Net      0 ml   Vitals reviewed. General: resting in bed comfortably, cachetic.  HEENT: No scleral icterus, MM Cardiac: RRR, no rubs, murmurs or gallops Pulm: No respiratory distress, scattered rhonchi with bibasilar rales.  Chest: PICC in place at LUE.  Abd: soft, nontender, nondistended, BS present Ext: warm and well perfused, no pedal edema Neuro: Alert, responsive, following commands  Lab Results: Basic Metabolic Panel:  Recent Labs Lab 01/30/13 0600 01/31/13 0500  NA 141 140  K 3.6 3.4*  CL 104 103  CO2 34* 34*  GLUCOSE 89 86  BUN 24* 18  CREATININE 0.55 0.47*  CALCIUM 8.1* 7.6*  MG 2.1  --   PHOS 3.1  --    Liver Function Tests:  Recent Labs Lab 01/28/13 1900 01/30/13 1110  AST 45* 33  ALT 22 21  ALKPHOS 239* 203*  BILITOT 0.7 0.5  PROT 7.5 6.0  ALBUMIN 2.0* 1.6*    Recent Labs Lab 01/30/13 0600 01/31/13 0500  WBC 6.7 5.2  NEUTROABS 4.6  --   HGB 8.4* 8.6*  HCT 26.1* 27.4*  MCV 98.1 97.9  PLT 122* 127*    BNP:  Recent Labs Lab 01/28/13 1227  PROBNP 2180.0*   Coagulation:  Recent Labs Lab 01/30/13 0600 01/30/13 1110  01/30/13 2300 01/31/13 1110  LABPROT 61.4* 58.7* 19.6* 15.5*  INR 7.63* 7.21* 1.71* 1.26   Urinalysis:  Recent Labs Lab 01/28/13 1444  COLORURINE AMBER*  LABSPEC 1.020  PHURINE 5.0  GLUCOSEU NEGATIVE  HGBUR NEGATIVE  BILIRUBINUR NEGATIVE  KETONESUR 15*  PROTEINUR NEGATIVE  UROBILINOGEN 1.0  NITRITE NEGATIVE  LEUKOCYTESUR NEGATIVE    Micro Results: Recent Results (from the past 240 hour(s))  URINE CULTURE     Status: None   Collection Time    01/28/13  2:44 PM      Result Value Range Status   Specimen Description URINE, RANDOM   Final   Special Requests NONE   Final   Culture  Setup Time     Final   Value: 01/28/2013 15:30     Performed at [facility] Count     Final   Value: >=100,000 COLONIES/ML     Performed at [facility]   Culture     Final   Value: Multiple bacterial morphotypes present, none predominant. Suggest appropriate recollection if clinically indicated.     Performed at [facility]   Report Status 01/29/2013 FINAL   Final  Studies/Results: No results found. Medications: I have reviewed the patient's current medications. Scheduled Meds: . antiseptic oral rinse  15 mL Mouth Rinse q12n4p  . chlorhexidine  15 mL Mouth Rinse QID  . fluconazole (DIFLUCAN) IV  100 mg Intravenous Q24H  . levalbuterol  0.63 mg Nebulization BID  . magic mouthwash  5 mL Oral QID  . neomycin-bacitracin-polymyxin   Topical Daily   Continuous Infusions: . dextrose 5 % and 0.45% NaCl 20 mL/hr at 01/30/13 2114   PRN Meds:.levalbuterol, polyvinyl alcohol, sodium chloride, sodium chloride Assessment/Plan: [name] is a 79 y.o. woman w/ PMHx of CVA (L. MCA, 2009), known history of dysphagia (2/2 CVA), dementia, COPD, HTN, CHF (EF 40-45% in 9/14) and HOCM, paroxysmal A-fib, symptomatic bradycardia w/ PPM, and recent admission for aspiration pneumonia in October, presents with worsening dysphagia and course breathing x1 day.    #Failure to thrive - She has known dysphasia and multiple admissions for aspiration and aspiration pneumonia. She is cachectic with severe protein calorie malnutrition, as evidenced by an albumin of 1.9. PICC line in place. During previous hospitalization in October the family did not want to pursue a PEG or palliative care consult. Speech therapy has seen the patient and recommends continuation of NPO status due to persistent dysphagia and risk for aspiration pneumonia. Palliative care has been meeting with the family. Family is not interested in hospice - they embrace the philosophy of "never giving up" - and would like to continue with their team of paid caregivers who provide in home care. Goals are for her to swallow again-avoid hospitalization-and go home when medically stable or maximized. They would like to provide comfort feeds, so patient will be discharged with a diet recommendation for Dysphagia 1 (puree);Pudding-thick liquid. She had acute onset hypoxia yesterday afternoon with sats in low 80's. This improved with a Xopenex treatment and a 1 time dose of Lasix. She is now satting 99% on 3L [place]. - D5 1/2NS @ 10-20 ml/hr  - Palliative care and SLP consulted, appreciate help with this patient - Patient medically stable for discharge home with caregivers today with [facility] referral  #Severe dysphagia, possible aspiration pneumonia - Patient w/ known history of dysphagia 2/2 CVA in 2009, witth history of recurrent aspiration pneumonia. CXR performed on presentation with persistent cardiomegaly with bilateral pleural effusions and basilar atelectasis/consolidation which are chronic findings. Repeat CXR unchanged. She has improved clinically over the course of her admission with improvement in her mental status. Patient is now quite alert (in light of her deconditioned and fragile state) and does not appear to be in distress. This has motivated the family towards aggressive  treatment despite the fact that we have very little additional to offer from a medical standpoint to restore her to health. Nonetheless, we obtained GI consult late last night per family's wishes to see if there is anything else they would recommend. - Follow up Dr. Lamar Sprinkles recs prior to discharge - D/c Clindamycin as she is afebrile, no leukocytosis, repeat CXR unchanged, and C. Diff infection would be miserable for her - Continue fluconazole 100mg  IV daily for presumed thrush, 14 day course - Care management consult for equipment needs > have provided hard scripts for fluids, fluconazole, Magic Mouthwash, Peridex mouth swabs; home health agency has arranged for these to be available upon discharge - Continue home Xopenex nebs q4h  - Strict bed rest  - Oral care/ deep suctioning   #Elevated INR -  Resolved. INR elevated  on 12/5 to 7.2, confirmed by repeat testing. Hepatic function panel normal, last dose of warfarin 2 days prior to this lab finding. She received vitamin K 2.5mg  IV with repeat INR decreased to 1.71 which is a significant and unexpectant decrease in the INR. Repeat INR 1.26 this am. - Continue to monitor for signs/symptioms of bleeding - Per Dr. Phillips Odor will discontinue all po meds at discharge  INR  Date Value Range Status  01/31/2013 1.26  0.00 - 1.49 Final  01/30/2013 1.71* 0.00 - 1.49 Final  01/30/2013 7.21* 0.00 - 1.49 Final     CRITICAL RESULT CALLED TO, READ BACK BY AND VERIFIED WITH:     WELLINGTON N RN 01/30/13 1144 COSTELLO B  01/30/2013 7.63* 0.00 - 1.49 Final     CRITICAL RESULT CALLED TO, READ BACK BY AND VERIFIED WITH:     Argentina Donovan RN 01/30/13 0802 COSTELLO B     REPEATED TO VERIFY    #Heart failure with mild depression of ejection fraction, HOCM - EF 40-45% in 9/14. - D/c Lasix since still NPO, currently clinically euvolemic - Per Dr. Phillips Odor will discontinue all po meds at discharge  Dispo: Disposition is deferred at this time, awaiting improvement of  current medical problems and palliative planning.   The patient does have a current PCP and does need an [facility] hospital follow-up appointment after discharge.  The patient does have transportation limitations that hinder transportation to clinic appointments.  .Services Needed at time of discharge: Y = Yes, Blank = No PT:   OT:   RN:   Equipment:   Other:     LOS: 7 days   Vivi Barrack, MD 02/04/2013, 10:47 AM

## 2013-02-04 NOTE — Progress Notes (Signed)
    Day 7 of stay      Patient name: Dawn Barrett  Medical record number: [id number]  Date of birth: 1918/04/29  Patient seems alert and responsive. She does not appear to be in distress. Exam unchanged from yesterday.  We are awaiting GI input and will decide on discharge based on that.   I have seen and evaluated this patient and discussed it with my IM resident team.  Please see the rest of the plan per resident note from today.   Elora Wolter 02/04/2013, 2:25 PM.

## 2013-02-04 NOTE — Care Management Note (Signed)
   CARE MANAGEMENT NOTE 02/04/2013  Patient:  Dawn Barrett, Dawn Barrett   Account Number:  [id number]  Date Initiated:  02/02/2013  Documentation initiated by:  Verneda Hollopeter  Subjective/Objective Assessment:   Order for consult due to home IV tubing needs.     Action/Plan:   Spoke with [facility] re need for [facility] supplies, she will contact daughter , hospital does not provide HH supplies. [facility] rep will contact pt daughter.   Anticipated DC Date:  02/03/2013   Anticipated DC Plan:  HOME W HOME HEALTH SERVICES      DC Planning Services  CM consult      Choice offered to / List presented to:     DME arranged  IV PUMP/EQUIPMENT      DME agency  [facility]     HH arranged  HH-1 RN      [facility] agency  [facility]   Status of service:  Completed, signed off Medicare Important Message given?   (If response is "NO", the following Medicare IM given date fields will be blank) Date Medicare IM given:   Date Additional Medicare IM given:    Discharge Disposition:  HOME W HOME HEALTH SERVICES  Per UR Regulation:    If discussed at Long Length of Stay Meetings, dates discussed:    Comments:  02/04/2013 Pt for d/c to home via ambulance, [facility] updated and will  resume HH services . CRoyal RN MPH, Case Manager, [phone]  02/03/13 1600 Pt d/c now on hold due to family request for GI consult. Pt HH agency notifed that d/c has been delayed. Will continue to follow.           Johny Shock RN MPH, case manager, [phone]  02/03/2013  [address] [place], [place]  [phone] Darryl Nestle called with notification of discharge, faxed copy of script for IV diflucan and IV fluid

## 2013-02-04 NOTE — Progress Notes (Signed)
Speech Language Pathology Discharge Patient Details Name: Dawn Barrett MRN: [id number] DOB: 06/25/1918 Today's Date: 02/04/2013    Patient discharged from SLP services secondary to poor progress toward goals;  Little else SLP can offer at this time.      Edi Gorniak L. Samson Frederic, [place] CCC/SLP Pager [phone]       Blenda Mounts Laurice

## 2013-02-05 MED ORDER — HEPARIN SOD (PORK) LOCK FLUSH 100 UNIT/ML IV SOLN
250.0000 [IU] | INTRAVENOUS | Status: AC | PRN
  Administered 2013-02-05: 250 [IU]

## 2013-02-05 NOTE — Progress Notes (Signed)
    Day 8 of stay      Patient name: Dawn Barrett  Medical record number: [id number]  Date of birth: 02-25-1919  The patient is medically stable, except for dysphagia for which we have come to the understanding that she will only do comfort feeding. Several palliative care discussions have taken place, and the family is aware and understands the patient's condition. Medical service is ready to discharge the patient, however, this has been difficult. Today I talked to the nursing supervisor who will talk to the family about this. We expect to discharge the patient today.  I have seen and evaluated this patient and discussed it with my IM resident team.  Please see the rest of the plan per resident note from today.   Debi Cousin 02/05/2013, 11:42 AM.

## 2013-02-05 NOTE — Progress Notes (Signed)
Subjective: Patient seen and examined at the bedside. Exam is unchanged. She is alert. Her breathing is not labored. She follows simple commands ("open your mouth"). She answers yes or no questions with nodding. She was supposed to go home last night but the family was concerned that it was too late and "too cold", and insisted she stay. She remains medically stable for discharge home with her caretakers.  Objective: Vital signs in last 24 hours: Filed Vitals:   02/04/13 1741 02/04/13 2026 02/04/13 2104 02/05/13 0500  BP: 117/72 124/53  148/67  Pulse: 60 66  60  Temp: 97.4 F (36.3 C) 98.1 F (36.7 C)  97.3 F (36.3 C)  TempSrc: Axillary Axillary  Oral  Resp: 18 18  18   Height:      Weight:      SpO2: 100% 99% 99% 100%   Weight change:   Intake/Output Summary (Last 24 hours) at 02/05/13 0724 Last data filed at 02/04/13 1900  Gross per 24 hour  Intake 483.34 ml  Output      0 ml  Net 483.34 ml   Vitals reviewed. General: resting in bed comfortably, cachetic.  HEENT: No scleral icterus, MM Cardiac: RRR, no rubs, murmurs or gallops Pulm: No respiratory distress, scattered rhonchi with bibasilar rales.  Chest: PICC in place at LUE.  Abd: soft, nontender, nondistended, BS present Ext: warm and well perfused, no pedal edema Neuro: Alert, responsive, following commands  Lab Results: Basic Metabolic Panel:  Recent Labs Lab 01/30/13 0600 01/31/13 0500  NA 141 140  K 3.6 3.4*  CL 104 103  CO2 34* 34*  GLUCOSE 89 86  BUN 24* 18  CREATININE 0.55 0.47*  CALCIUM 8.1* 7.6*  MG 2.1  --   PHOS 3.1  --    Liver Function Tests:  Recent Labs Lab 01/30/13 1110  AST 33  ALT 21  ALKPHOS 203*  BILITOT 0.5  PROT 6.0  ALBUMIN 1.6*    Recent Labs Lab 01/30/13 0600 01/31/13 0500  WBC 6.7 5.2  NEUTROABS 4.6  --   HGB 8.4* 8.6*  HCT 26.1* 27.4*  MCV 98.1 97.9  PLT 122* 127*    BNP: No results found for this basename: PROBNP,  in the last 168  hours Coagulation:  Recent Labs Lab 01/30/13 0600 01/30/13 1110 01/30/13 2300 01/31/13 1110  LABPROT 61.4* 58.7* 19.6* 15.5*  INR 7.63* 7.21* 1.71* 1.26   Urinalysis: No results found for this basename: COLORURINE, APPERANCEUR, LABSPEC, PHURINE, GLUCOSEU, HGBUR, BILIRUBINUR, KETONESUR, PROTEINUR, UROBILINOGEN, NITRITE, LEUKOCYTESUR,  in the last 168 hours  Micro Results: Recent Results (from the past 240 hour(s))  URINE CULTURE     Status: None   Collection Time    01/28/13  2:44 PM      Result Value Range Status   Specimen Description URINE, RANDOM   Final   Special Requests NONE   Final   Culture  Setup Time     Final   Value: 01/28/2013 15:30     Performed at [facility] Count     Final   Value: >=100,000 COLONIES/ML     Performed at [facility]   Culture     Final   Value: Multiple bacterial morphotypes present, none predominant. Suggest appropriate recollection if clinically indicated.     Performed at [facility]   Report Status 01/29/2013 FINAL   Final   Studies/Results: No results found. Medications: I have reviewed the patient's current medications.  Scheduled Meds: . antiseptic oral rinse  15 mL Mouth Rinse q12n4p  . chlorhexidine  15 mL Mouth Rinse QID  . fluconazole (DIFLUCAN) IV  100 mg Intravenous Q24H  . levalbuterol  0.63 mg Nebulization BID  . magic mouthwash  5 mL Oral QID  . neomycin-bacitracin-polymyxin   Topical Daily   Continuous Infusions: . dextrose 5 % and 0.45% NaCl 20 mL/hr at 01/30/13 2114   PRN Meds:.levalbuterol, polyvinyl alcohol, sodium chloride, sodium chloride Assessment/Plan: Dawn Barrett is a 79 y.o. woman w/ PMHx of CVA (L. MCA, 2009), known history of dysphagia (2/2 CVA), dementia, COPD, HTN, CHF (EF 40-45% in 9/14) and HOCM, paroxysmal A-fib, symptomatic bradycardia w/ PPM, and recent admission for aspiration pneumonia in October, presents with worsening dysphagia and course  breathing x1 day.   #Failure to thrive - She has known dysphasia and multiple admissions for aspiration and aspiration pneumonia. She is cachectic with severe protein calorie malnutrition, as evidenced by an albumin of 1.9. PICC line in place. During previous hospitalization in October the family did not want to pursue a PEG or palliative care consult. Speech therapy has seen the patient and recommends continuation of NPO status due to persistent dysphagia and risk for aspiration pneumonia. Palliative care has been meeting with the family. Family is not interested in hospice - they embrace the philosophy of "never giving up" - and would like to continue with their team of paid caregivers who provide in home care. Goals are for her to swallow again-avoid hospitalization-and go home when medically stable or maximized. They would like to provide comfort feeds, so patient will be discharged with a diet recommendation for Dysphagia 1 (puree);Pudding-thick liquid. - D5 1/2NS @ 10-20 ml/hr  - Palliative care and SLP consulted, appreciate help with this patient - Patient medically stable for discharge home with caregivers today with [facility] referral  #Severe dysphagia, possible aspiration pneumonia - Patient w/ known history of dysphagia 2/2 CVA in 2009, witth history of recurrent aspiration pneumonia. CXR performed on presentation with persistent cardiomegaly with bilateral pleural effusions and basilar atelectasis/consolidation which are chronic findings. Repeat CXR unchanged. She has improved clinically over the course of her admission with improvement in her mental status. Patient is now quite alert (in light of her deconditioned and fragile state) and does not appear to be in distress. This has motivated the family towards aggressive treatment despite the fact that we have very little additional to offer from a medical standpoint to restore her to health. Nonetheless, we obtained GI  consult late last night per family's wishes to see if there is anything else they would recommend. - GI consult > Dr. Marina Goodell reinforced that we had nothing additional to offer her and communicated this to the family - D/c Clindamycin as she is afebrile, no leukocytosis, repeat CXR unchanged, and C. Diff infection would be miserable for her - Continue fluconazole 100mg  IV daily for presumed thrush, 14 day course - Care management consult for equipment needs > have provided hard scripts for fluids, fluconazole, Magic Mouthwash, Peridex mouth swabs; home health agency has arranged for these to be available upon discharge - Continue home Xopenex nebs q4h  - Strict bed rest  - Oral care/ deep suctioning   #Elevated INR -  Resolved. INR elevated on 12/5 to 7.2, confirmed by repeat testing. Hepatic function panel normal, last dose of warfarin 2 days prior to this lab finding. She received vitamin K 2.5mg  IV with repeat INR decreased  to 1.71 which is a significant and unexpectant decrease in the INR. Repeat INR 1.26 this am. - Continue to monitor for signs/symptioms of bleeding - Per Dr. Phillips Odor will discontinue all po meds at discharge  INR  Date Value Range Status  01/31/2013 1.26  0.00 - 1.49 Final  01/30/2013 1.71* 0.00 - 1.49 Final  01/30/2013 7.21* 0.00 - 1.49 Final     CRITICAL RESULT CALLED TO, READ BACK BY AND VERIFIED WITH:     WELLINGTON N RN 01/30/13 1144 COSTELLO B  01/30/2013 7.63* 0.00 - 1.49 Final     CRITICAL RESULT CALLED TO, READ BACK BY AND VERIFIED WITH:     Argentina Donovan RN 01/30/13 0802 COSTELLO B     REPEATED TO VERIFY    #Heart failure with mild depression of ejection fraction, HOCM - EF 40-45% in 9/14. - D/c Lasix since still NPO, currently clinically euvolemic - Per Dr. Phillips Odor will discontinue all po meds at discharge  Dispo: Disposition is deferred at this time, awaiting improvement of current medical problems and palliative planning.   The patient does have a current PCP  and does need an [facility] hospital follow-up appointment after discharge.  The patient does have transportation limitations that hinder transportation to clinic appointments.  .Services Needed at time of discharge: Y = Yes, Blank = No PT:   OT:   RN:   Equipment:   Other:     LOS: 8 days   Vivi Barrack, MD 02/05/2013, 7:24 AM

## 2013-02-05 NOTE — Progress Notes (Signed)
Pt transported off of unit via [facility] accompanied by caregiver. Room checked by caregiver for belongings, sent with PICC intact.

## 2013-02-05 NOTE — Discharge Summary (Signed)
   Date: 02/05/2013    Patient name: Dawn Barrett  MRN: [id number]  Date of birth: 1918/09/10  I evaluated the patient on the day of discharge and discussed the discharge plan with my resident team. I agree with the discharge documentation and disposition.     Aletta Edouard 02/05/2013, 2:57 PM

## 2013-02-12 ENCOUNTER — Emergency Department (HOSPITAL_COMMUNITY): Payer: Medicare Other

## 2013-02-12 ENCOUNTER — Inpatient Hospital Stay (HOSPITAL_COMMUNITY)
Admission: EM | Admit: 2013-02-12 | Discharge: 2013-02-13 | DRG: 313 | Disposition: A | Discharge status: Home / Self Care | Payer: Medicare Other | Attending: Internal Medicine | Admitting: Internal Medicine

## 2013-02-12 ENCOUNTER — Encounter (HOSPITAL_COMMUNITY): Payer: Self-pay | Admitting: Emergency Medicine

## 2013-02-12 DIAGNOSIS — I4891 Unspecified atrial fibrillation: Secondary | ICD-10-CM | POA: Diagnosis present

## 2013-02-12 DIAGNOSIS — R1310 Dysphagia, unspecified: Secondary | ICD-10-CM | POA: Diagnosis present

## 2013-02-12 DIAGNOSIS — F0390 Unspecified dementia without behavioral disturbance: Secondary | ICD-10-CM | POA: Diagnosis present

## 2013-02-12 DIAGNOSIS — E876 Hypokalemia: Secondary | ICD-10-CM | POA: Diagnosis present

## 2013-02-12 DIAGNOSIS — I69991 Dysphagia following unspecified cerebrovascular disease: Secondary | ICD-10-CM | POA: Diagnosis present

## 2013-02-12 DIAGNOSIS — Z8249 Family history of ischemic heart disease and other diseases of the circulatory system: Secondary | ICD-10-CM | POA: Diagnosis present

## 2013-02-12 DIAGNOSIS — J449 Chronic obstructive pulmonary disease, unspecified: Secondary | ICD-10-CM | POA: Diagnosis present

## 2013-02-12 DIAGNOSIS — I5042 Chronic combined systolic (congestive) and diastolic (congestive) heart failure: Secondary | ICD-10-CM | POA: Diagnosis present

## 2013-02-12 DIAGNOSIS — E46 Unspecified protein-calorie malnutrition: Secondary | ICD-10-CM | POA: Diagnosis present

## 2013-02-12 DIAGNOSIS — I509 Heart failure, unspecified: Secondary | ICD-10-CM | POA: Diagnosis present

## 2013-02-12 DIAGNOSIS — R0789 Other chest pain: Principal | ICD-10-CM | POA: Diagnosis present

## 2013-02-12 DIAGNOSIS — I447 Left bundle-branch block, unspecified: Secondary | ICD-10-CM | POA: Diagnosis present

## 2013-02-12 DIAGNOSIS — Z950 Presence of cardiac pacemaker: Secondary | ICD-10-CM | POA: Diagnosis present

## 2013-02-12 DIAGNOSIS — Z7401 Bed confinement status: Secondary | ICD-10-CM | POA: Diagnosis present

## 2013-02-12 DIAGNOSIS — E860 Dehydration: Secondary | ICD-10-CM | POA: Diagnosis present

## 2013-02-12 DIAGNOSIS — F411 Generalized anxiety disorder: Secondary | ICD-10-CM | POA: Diagnosis present

## 2013-02-12 DIAGNOSIS — I079 Rheumatic tricuspid valve disease, unspecified: Secondary | ICD-10-CM | POA: Diagnosis present

## 2013-02-12 DIAGNOSIS — I421 Obstructive hypertrophic cardiomyopathy: Secondary | ICD-10-CM | POA: Diagnosis present

## 2013-02-12 DIAGNOSIS — R609 Edema, unspecified: Secondary | ICD-10-CM | POA: Diagnosis present

## 2013-02-12 DIAGNOSIS — J4489 Other specified chronic obstructive pulmonary disease: Secondary | ICD-10-CM | POA: Diagnosis present

## 2013-02-12 DIAGNOSIS — R64 Cachexia: Secondary | ICD-10-CM | POA: Diagnosis present

## 2013-02-12 DIAGNOSIS — I252 Old myocardial infarction: Secondary | ICD-10-CM | POA: Diagnosis present

## 2013-02-12 DIAGNOSIS — I059 Rheumatic mitral valve disease, unspecified: Secondary | ICD-10-CM | POA: Diagnosis present

## 2013-02-12 DIAGNOSIS — I10 Essential (primary) hypertension: Secondary | ICD-10-CM | POA: Diagnosis present

## 2013-02-12 DIAGNOSIS — I498 Other specified cardiac arrhythmias: Secondary | ICD-10-CM | POA: Diagnosis present

## 2013-02-12 HISTORY — DX: Squamous cell carcinoma of skin of right upper limb, including shoulder: C44.622

## 2013-02-12 HISTORY — DX: Anxiety disorder, unspecified: F41.9

## 2013-02-12 HISTORY — DX: Presence of cardiac pacemaker: Z95.0

## 2013-02-12 HISTORY — DX: Shortness of breath: R06.02

## 2013-02-12 HISTORY — DX: Prediabetes: R73.03

## 2013-02-12 HISTORY — DX: Pneumonitis due to inhalation of food and vomit: J69.0

## 2013-02-12 HISTORY — DX: Cardiac murmur, unspecified: R01.1

## 2013-02-12 HISTORY — DX: Dependence on supplemental oxygen: Z99.81

## 2013-02-12 HISTORY — DX: Acute embolism and thrombosis of unspecified deep veins of unspecified lower extremity: I82.409

## 2013-02-12 LAB — BASIC METABOLIC PANEL
BUN: 21 mg/dL (ref 6–23)
BUN: 22 mg/dL (ref 6–23)
Calcium: 8.3 mg/dL — ABNORMAL LOW (ref 8.4–10.5)
Chloride: 106 mEq/L (ref 96–112)
GFR calc Af Amer: 84 mL/min — ABNORMAL LOW (ref >90)
GFR calc non Af Amer: 73 mL/min — ABNORMAL LOW (ref >90)
GFR calc non Af Amer: 72 mL/min — ABNORMAL LOW (ref >90)
Glucose, Bld: 90 mg/dL (ref 70–99)
Glucose, Bld: 79 mg/dL (ref 70–99)
Potassium: 2.8 mEq/L — ABNORMAL LOW (ref 3.5–5.1)
Potassium: 3.7 mEq/L (ref 3.5–5.1)
Sodium: 149 mEq/L — ABNORMAL HIGH (ref 135–145)

## 2013-02-12 LAB — HEPATIC FUNCTION PANEL
AST: 43 U/L — ABNORMAL HIGH (ref 0–37)
Bilirubin, Direct: 0.2 mg/dL (ref 0.0–0.3)
Indirect Bilirubin: 0.5 mg/dL (ref 0.3–0.9)
Total Bilirubin: 0.7 mg/dL (ref 0.3–1.2)
Total Protein: 6.7 g/dL (ref 6.0–8.3)

## 2013-02-12 LAB — URINALYSIS, ROUTINE W REFLEX MICROSCOPIC
Hgb urine dipstick: NEGATIVE
Nitrite: NEGATIVE
Protein, ur: NEGATIVE mg/dL
Specific Gravity, Urine: 1.013 (ref 1.005–1.030)
Urobilinogen, UA: 0.2 mg/dL (ref 0.0–1.0)
pH: 6 (ref 5.0–8.0)

## 2013-02-12 LAB — PROTIME-INR
INR: 2.68 — ABNORMAL HIGH (ref 0.00–1.49)
Prothrombin Time: 27.6 seconds — ABNORMAL HIGH (ref 11.6–15.2)

## 2013-02-12 LAB — CBC
HCT: 32 % — ABNORMAL LOW (ref 36.0–46.0)
Hemoglobin: 9.8 g/dL — ABNORMAL LOW (ref 12.0–15.0)
MCHC: 30.6 g/dL (ref 30.0–36.0)
Platelets: 97 10*3/uL — ABNORMAL LOW (ref 150–400)
RBC: 3.23 MIL/uL — ABNORMAL LOW (ref 3.87–5.11)
WBC: 8.4 10*3/uL (ref 4.0–10.5)

## 2013-02-12 LAB — TROPONIN I
Troponin I: <0.3 ng/mL (ref <0.30)
Troponin I: <0.3 ng/mL (ref <0.30)

## 2013-02-12 LAB — POCT I-STAT TROPONIN I: Troponin i, poc: 0.09 ng/mL (ref 0.00–0.08)

## 2013-02-12 LAB — PRO B NATRIURETIC PEPTIDE: Pro B Natriuretic peptide (BNP): 8489 pg/mL — ABNORMAL HIGH (ref 0–450)

## 2013-02-12 MED ORDER — ENOXAPARIN SODIUM 30 MG/0.3ML ~~LOC~~ SOLN
30.0000 mg | SUBCUTANEOUS | Status: DC
  Administered 2013-02-12: 30 mg via SUBCUTANEOUS
  Filled 2013-02-12 (×3): qty 0.3

## 2013-02-12 MED ORDER — DEXTROSE-NACL 5-0.45 % IV SOLN
INTRAVENOUS | Status: DC
  Administered 2013-02-12: 17:00:00 via INTRAVENOUS
  Administered 2013-02-13: 1000 mL via INTRAVENOUS

## 2013-02-12 MED ORDER — AMIODARONE HCL 100 MG PO TABS
100.0000 mg | ORAL_TABLET | Freq: Every day | ORAL | Status: DC
  Filled 2013-02-12: qty 1

## 2013-02-12 MED ORDER — MAGIC MOUTHWASH
5.0000 mL | Freq: Four times a day (QID) | ORAL | Status: DC
  Administered 2013-02-13: 5 mL via ORAL
  Filled 2013-02-12 (×5): qty 5

## 2013-02-12 MED ORDER — FLUCONAZOLE 100MG IVPB
100.0000 mg | INTRAVENOUS | Status: DC
  Administered 2013-02-12: 100 mg via INTRAVENOUS
  Filled 2013-02-12 (×3): qty 50

## 2013-02-12 MED ORDER — POTASSIUM CHLORIDE 10 MEQ/100ML IV SOLN
10.0000 meq | INTRAVENOUS | Status: AC
  Administered 2013-02-12 (×4): 10 meq via INTRAVENOUS
  Filled 2013-02-12 (×4): qty 100

## 2013-02-12 MED ORDER — LEVALBUTEROL HCL 0.63 MG/3ML IN NEBU
0.6300 mg | INHALATION_SOLUTION | Freq: Four times a day (QID) | RESPIRATORY_TRACT | Status: DC | PRN
  Administered 2013-02-13: 0.63 mg via RESPIRATORY_TRACT
  Filled 2013-02-12: qty 3

## 2013-02-12 MED ORDER — NITROGLYCERIN 0.6 MG SL SUBL: 0.6000 mg | SUBLINGUAL_TABLET | SUBLINGUAL | Status: DC | PRN

## 2013-02-12 MED ORDER — ASPIRIN 300 MG RE SUPP
300.0000 mg | Freq: Once | RECTAL | Status: AC
  Administered 2013-02-12: 300 mg via RECTAL
  Filled 2013-02-12: qty 1

## 2013-02-12 MED ORDER — NITROGLYCERIN 0.4 MG SL SUBL: 0.4000 mg | SUBLINGUAL_TABLET | SUBLINGUAL | Status: DC | PRN

## 2013-02-12 MED ORDER — DEXTROSE-NACL 5-0.45 % IV SOLN
INTRAVENOUS | Status: DC
  Filled 2013-02-12 (×2): qty 1000

## 2013-02-12 NOTE — ED Provider Notes (Signed)
CSN: [id number]     Arrival date & time 02/12/13  1113 History   First MD Initiated Contact with Patient 02/12/13 1126     Chief Complaint  Patient presents with  . Chest Pain   (Consider location/radiation/quality/duration/timing/severity/associated sxs/prior Treatment) HPI Comments: 79 year old female with a history is recent MI, dysphasia being treated with Diflucan, and a general overall decline in health recently, presenting with chest pain. Her caregiver state that she was ill appearing this morning. When they asked if her chest was hurting, she nodded her head yes.  She has a history of CVA.  According to her caregivers, she does not speak much. History is therefore limited. When I asked her chest hurt she nodded no. When I asked her if her epigastrium hurt, she nodded yes.  Level V caveat.  Patient is a 79 y.o. female presenting with chest pain.  Chest Pain Pain location:  Substernal area Pain quality comment:  Unable to specify Pain severity:  Unable to specify Onset quality:  Unable to specify Chronicity:  Recurrent Ineffective treatments:  Nitroglycerin   Past Medical History  Diagnosis Date  . Stroke     a. L MCA CVA 2009.  Marland Kitchen Hypertension   . COPD (chronic obstructive pulmonary disease)   . Paroxysmal atrial fibrillation   . NSTEMI (non-ST elevated myocardial infarction)     a. 10/2012 - managed medically.  . Chronic combined systolic and diastolic CHF (congestive heart failure)   . Symptomatic bradycardia     a. s/p St. Jude pacemaker 2009.  Marland Kitchen PNA (pneumonia)     a. Per notes, h/o pulm injury r/t PNA.  aspiration PNA 11/2012  . LBBB (left bundle branch block)   . HOCM (hypertrophic obstructive cardiomyopathy)     a. 2D Echo 10/2012: EF 40-45%, mild LVH, mild-mod TR, mitral SAM with mod MR, LVOT gradient not well assessed but at least 2.83m/s; findings c/w HOCM.  Marland Kitchen Mitral regurgitation     a. Mod MR by echo 10/2012.  . Tricuspid regurgitation     a. Mild-mod TR by  echo 10/2012.  Marland Kitchen Dysphagia    Past Surgical History  Procedure Laterality Date  . Pacemaker insertion  11/19/2007    St. Jude Zephyr 5020 pulse generator, serial [id number]  . Femur surgery Right 06/2011    Rod inserted after a fall.    Family History  Problem Relation Age of Onset  . Heart disease Mother    History  Substance Use Topics  . Smoking status: Never Smoker   . Smokeless tobacco: Not on file  . Alcohol Use: No   OB History   Grav Para Term Preterm Abortions TAB SAB Ect Mult Living                 Review of Systems  Unable to perform ROS: Patient nonverbal  Cardiovascular: Positive for chest pain.    Allergies  Codeine; Levaquin; and Sulfonamide derivatives  Home Medications   Current Outpatient Rx  Name  Route  Sig  Dispense  Refill  . Alum & Mag Hydroxide-Simeth (MAGIC MOUTHWASH) SOLN   Oral   Take 5 mLs by mouth 4 (four) times daily.   600 mL   11   . amiodarone (PACERONE) 200 MG tablet   Oral   Take 100 mg by mouth daily.         Marland Kitchen Dextrose-Sodium Chloride (DEXTROSE 5 % AND 0.45% NACL) infusion   Intravenous   Inject 15 mL/hr into the vein continuous.         Marland Kitchen  fluconazole (DIFLUCAN) 2 mg/mL SOLN IVPB   Intravenous   Inject 50 mLs (100 mg total) into the vein daily.   1200 mL   0   . furosemide (LASIX) 8 MG/ML solution   Oral   Take 5 mg by mouth daily.         Marland Kitchen levalbuterol (XOPENEX) 0.63 MG/3ML nebulizer solution   Nebulization   Take 3 mLs (0.63 mg total) by nebulization every 6 (six) hours as needed for wheezing.   3 mL   30     Please give 1 month supply   . nitroGLYCERIN (NITROSTAT) 0.6 MG SL tablet   Sublingual   Place 0.6 mg under the tongue every 5 (five) minutes as needed for chest pain.         Marland Kitchen spironolactone (ALDACTONE) 25 MG tablet   Oral   Take 25 mg by mouth daily.          BP 89/49  Pulse 76  Temp(Src) 97.7 F (36.5 C) (Axillary)  Resp 21  SpO2 97% Physical Exam  Nursing note and vitals  reviewed. Constitutional: She appears well-developed and well-nourished. No distress.  Chronically ill-appearing  HENT:  Head: Normocephalic and atraumatic.  Mouth/Throat: Oropharynx is clear and moist.  Dry mucous membranes Small amount of white plaque on tongue  Eyes: Conjunctivae are normal. Pupils are equal, round, and reactive to light. No scleral icterus.  Clouded corneas bilaterally  Neck: Neck supple.  Cardiovascular: Normal rate, regular rhythm, normal heart sounds and intact distal pulses.   No murmur heard. Pulmonary/Chest: Effort normal and breath sounds normal. No stridor. No respiratory distress. She has no rales.  Abdominal: Soft. Bowel sounds are normal. She exhibits no distension. There is tenderness in the epigastric area. There is no rigidity, no rebound and no guarding.  Musculoskeletal: Normal range of motion. She exhibits edema (1+ bilateral lower extremity).  Neurological: She is alert.  Skin: Skin is warm and dry. No rash noted.  Psychiatric: She has a normal mood and affect. Her behavior is normal.    ED Course  Procedures (including critical care time) Labs Review Labs Reviewed  CBC - Abnormal; Notable for the following:    RBC 3.23 (*)    Hemoglobin 9.8 (*)    HCT 32.0 (*)    RDW 17.1 (*)    All other components within normal limits  BASIC METABOLIC PANEL  URINALYSIS, ROUTINE W REFLEX MICROSCOPIC  PRO B NATRIURETIC PEPTIDE  TROPONIN I  HEPATIC FUNCTION PANEL   Imaging Review Dg Chest Port 1 View  02/12/2013   CLINICAL DATA:  Chest pain.  Short of breath.  EXAM: PORTABLE CHEST - 1 VIEW  COMPARISON:  01/30/2013.  FINDINGS: Cardiopericardial silhouette is partially obscured but appears enlarged. Tortuous atherosclerotic thoracic aorta. Old right rib fractures. Two lead left subclavian pacemaker. Bilateral pleural effusions are present. Right pleural effusion is moderate and left pleural fusion is mild. Dependent atelectasis is present both lung bases.  There is pulmonary vascular congestion and interstitial and alveolar pulmonary edema compatible with moderate CHF.  IMPRESSION: Constellation of findings compatible with moderate CHF. Moderate right and small left pleural effusion.   Electronically Signed   By: Andreas Newport M.D.   On: 02/12/2013 12:34  All radiology studies independently viewed by me.     EKG Interpretation    Date/Time:  Thursday February 12 2013 11:20:20 EST Ventricular Rate:  84 PR Interval:  173 QRS Duration: 143 QT Interval:  494 QTC Calculation: 584 R  Axis:   -84 Text Interpretation:  Sinus rhythm Left bundle branch block Baseline wander in lead(s) II aVF No significant change was found Confirmed by [facility]  MD, TREY ([id number]) on 02/12/2013 12:02:50 PM            MDM   1. CHF, acute on chronic   2. Hypokalemia    79 yo female with recent decline in health and recent hospitalization for dysphagia, failure to thrive, and CHF who presents with chest pain and malaise.  ED workup shows hypokalemia and pulmonary edema.  I discussed her case with her daughter via telephone who wanted to pursue admission.  Consulted internal medicine.  Potassium replacement initiated.  Will defer lasix in ED due to hypokalemia.      Candyce Churn, MD 02/12/13 [phone]

## 2013-02-12 NOTE — ED Notes (Signed)
Caregivers at bedside, sts the pt received 5mg  of IV lasix.

## 2013-02-12 NOTE — ED Notes (Signed)
Caregivers reports she is unable to swallow pills.

## 2013-02-12 NOTE — ED Notes (Signed)
Istat troponin shown to [place], Char[facility] fundraiser and Dr Loretha Stapler

## 2013-02-12 NOTE — H&P (Signed)
Date: 02/12/2013               Patient Name:  Dawn Barrett MRN: [id number]  DOB: 11/06/1918 Age / Sex: 79 y.o., female   PCP: Provider Default, MD         Medical Service: Internal Medicine Teaching Service         Attending Physician: Dr. Farley Ly, MD    First Contact: Dr. Vivi Barrack, MD Pager: [phone]  Second Contact: Dr. Sara Chu Pager: [phone]       After Hours (After 5p/  First Contact Pager: [phone]  weekends / holidays): Second Contact Pager: [phone]   ___________________________________________________________________  Chief Complaint: Chest Pain  History of present illness: Pt is a 79 y.o. female who lives with her daughter and is completely bedbound.  She has a PMH significant for CVA (L. MCA, 2009),  dysphagia (2/2 CVA), dementia, COPD (unknown stage), HTN, systolic HF (EF 40-45% in 9/14) and HOCM, paroxysmal A-fib, symptomatic bradycardia w/ PPM.   She is accompanied by a home health aide who provided the history.  She was brought in for evaluation of chest pain that began this morning.  The aide said she looked uncomfortable and asked if she had chest pain and the pt nodded her head.  The pt denied any current CP when asked.  The aide denied any associated symptoms of N/V or diaphoresis.  She also has experienced decreased appetite for the past couple of days, is less responsive, and with productive cough (chronic).  She typically eats small amounts pureed foods and has an appetite on most days, but since Tuesday she has been eating less and has difficulty swallowing foods completely w/ repeated regurgitation.  The aide noticed she developed more audible ronchi.   On reviewing the records, the pt has a h/o chronic aspiration.  She was evaluated by GI during her last admission and they did not recommend any further intervention.   Pt had similar admissions for acute on chronic dysphasia. She has been admitted 5 times in the past 6 months.  GI was  consulted during her last admission and has been treated for oral candidiasis. She is being treated with Diflucan IV through a PICC line. Pt was also previously admitted on 12/04/12 for acute on chronic respiratory failure most likely d/t aspiration pneumonia, acute encephalopathy, and dehydration. Seen by Dr. Juanda Chance at this time to discuss PEG vs PICC placement. PICC was placed, PEG was refused, and patent was discharged.  Of note, Dr. Claudette Stapler spoke with the pt's daughter "Rod Holler" who apparently is her HCPOA.  She told Dr. Zada Girt that she was DNR/DNI.   Meds: Current Facility-Administered Medications  Medication Dose Route Frequency Provider Last Rate Last Dose  . dextrose 5 %-0.45 % sodium chloride infusion   Intravenous Continuous Farley Ly, MD 75 mL/hr at 02/12/13 1709      Prescriptions prior to admission  Medication Sig Dispense Refill  . Alum & Mag Hydroxide-Simeth (MAGIC MOUTHWASH) SOLN Take 5 mLs by mouth 4 (four) times daily.  600 mL  11  . amiodarone (PACERONE) 200 MG tablet Take 100 mg by mouth daily.      Marland Kitchen Dextrose-Sodium Chloride (DEXTROSE 5 % AND 0.45% NACL) infusion Inject 15 mL/hr into the vein continuous.      . fluconazole (DIFLUCAN) 2 mg/mL SOLN IVPB Inject 50 mLs (100 mg total) into the vein daily.  1200 mL  0  . furosemide (LASIX) 8 MG/ML solution Take 5 mg  by mouth daily.      Marland Kitchen levalbuterol (XOPENEX) 0.63 MG/3ML nebulizer solution Take 3 mLs (0.63 mg total) by nebulization every 6 (six) hours as needed for wheezing.  3 mL  30  . nitroGLYCERIN (NITROSTAT) 0.6 MG SL tablet Place 0.6 mg under the tongue every 5 (five) minutes as needed for chest pain.      Marland Kitchen spironolactone (ALDACTONE) 25 MG tablet Take 25 mg by mouth daily.        Allergies: Allergies as of 02/12/2013 - Review Complete 02/12/2013  Allergen Reaction Noted  . Codeine Other (See Comments)   . Levaquin [levofloxacin] Other (See Comments) 01/29/2013  . Sulfonamide derivatives Other (See Comments)       PMH: Past Medical History  Diagnosis Date  . Stroke     a. L MCA CVA 2009.  Marland Kitchen Hypertension   . COPD (chronic obstructive pulmonary disease)   . Paroxysmal atrial fibrillation   . NSTEMI (non-ST elevated myocardial infarction)     a. 10/2012 - managed medically.  . Chronic combined systolic and diastolic CHF (congestive heart failure)   . Symptomatic bradycardia     a. s/p St. Jude pacemaker 2009.  Marland Kitchen PNA (pneumonia)     a. Per notes, h/o pulm injury r/t PNA.  aspiration PNA 11/2012  . LBBB (left bundle branch block)   . HOCM (hypertrophic obstructive cardiomyopathy)     a. 2D Echo 10/2012: EF 40-45%, mild LVH, mild-mod TR, mitral SAM with mod MR, LVOT gradient not well assessed but at least 2.41m/s; findings c/w HOCM.  Marland Kitchen Mitral regurgitation     a. Mod MR by echo 10/2012.  . Tricuspid regurgitation     a. Mild-mod TR by echo 10/2012.  Marland Kitchen Dysphagia     PSH: Past Surgical History  Procedure Laterality Date  . Pacemaker insertion  11/19/2007    St. Jude Zephyr 5020 pulse generator, serial [id number]  . Femur surgery Right 06/2011    Rod inserted after a fall.     FH: Family History  Problem Relation Age of Onset  . Heart disease Mother     SH: History   Social History  . Marital Status: Widowed    Spouse Name: N/A    Number of Children: N/A  . Years of Education: N/A   Occupational History  . Retired    Social History Main Topics  . Smoking status: Never Smoker   . Smokeless tobacco: Not on file  . Alcohol Use: No  . Drug Use: No  . Sexual Activity: Not on file   Other Topics Concern  . Not on file   Social History Narrative   Pt lives with daughter in [place] but has been in [place] recently.    Review of Systems: Pertinent items are noted in HPI.  Physical Exam: Blood pressure 135/55, pulse 62, temperature 97.7 F (36.5 C), temperature source Rectal, resp. rate 16, SpO2 99.00%.  Physical Exam  Constitutional: She appears malnourished. She  appears unhealthy. She appears cachectic.  Non-toxic appearance. No distress.  HENT:  Head: Normocephalic and atraumatic.  Mouth/Throat: Mucous membranes are dry. No oral lesions.  Eyes: Conjunctivae are normal.  Cardiovascular: Normal rate, regular rhythm, normal heart sounds and intact distal pulses.  Exam reveals no gallop and no friction rub.   No murmur heard. b/l pitting edema  Pulmonary/Chest: Effort normal. No accessory muscle usage. No respiratory distress. She has no wheezes. She has rales. She exhibits no tenderness.  Pt has upper airway  gurgling sounds  Abdominal: Soft. Bowel sounds are normal. She exhibits no distension. There is no tenderness.  Skin: Skin is warm and dry. Ecchymosis (scattered) noted. She is not diaphoretic.       Lab results:  Basic Metabolic Panel:  Recent Labs  40/98/11 1129  NA 149*  K 2.8*  CL 106  CO2 39*  GLUCOSE 90  BUN 21  CREATININE 0.69  CALCIUM 8.2*   Anion Gap: 4  CBC:    Component Value Date/Time   WBC 8.4 02/12/2013 1129   RBC 3.23* 02/12/2013 1129   HGB 9.8* 02/12/2013 1129   HCT 32.0* 02/12/2013 1129   PLT 97* 02/12/2013 1129   MCV 99.1 02/12/2013 1129   MCH 30.3 02/12/2013 1129   MCHC 30.6 02/12/2013 1129   RDW 17.1* 02/12/2013 1129   LYMPHSABS 1.6 01/30/2013 0600   MONOABS 0.4 01/30/2013 0600   EOSABS 0.0 01/30/2013 0600   BASOSABS 0.0 01/30/2013 0600    Cardiac Enzymes:  Recent Labs  02/12/13 1144  TROPIPOC 0.09*   Lab Results  Component Value Date   CKTOTAL 39 12/04/2012   CKMB 4.6* 12/04/2012   TROPONINI <0.30 02/12/2013    BNP:  Recent Labs  02/12/13 1129  PROBNP 8489.0*   Coagulation:  Recent Labs  02/12/13 1231  LABPROT 27.6*  INR 2.68*    Urinalysis: No results found for this basename: COLORURINE, APPERANCEUR, LABSPEC, PHURINE, GLUCOSEU, HGBUR, BILIRUBINUR, KETONESUR, PROTEINUR, UROBILINOGEN, NITRITE, LEUKOCYTESUR,  in the last 72 hours  Imaging results:  Dg Chest Port 1  View  02/12/2013   CLINICAL DATA:  Chest pain.  Short of breath.  EXAM: PORTABLE CHEST - 1 VIEW  COMPARISON:  01/30/2013.  FINDINGS: Cardiopericardial silhouette is partially obscured but appears enlarged. Tortuous atherosclerotic thoracic aorta. Old right rib fractures. Two lead left subclavian pacemaker. Bilateral pleural effusions are present. Right pleural effusion is moderate and left pleural fusion is mild. Dependent atelectasis is present both lung bases. There is pulmonary vascular congestion and interstitial and alveolar pulmonary edema compatible with moderate CHF.  IMPRESSION: Constellation of findings compatible with moderate CHF. Moderate right and small left pleural effusion.   Electronically Signed   By: Andreas Newport M.D.   On: 02/12/2013 12:34    EKG:  Date/Time:  Thursday February 12 2013 11:20:20 EST Ventricular Rate:  84 PR Interval:  173 QRS Duration: 143 QT Interval:  494 QTC Calculation: 584 R Axis:   -84 Text Interpretation:  Sinus rhythm Left bundle branch block Baseline wander in lead(s) II aVF No significant change was found Confirmed by [facility]  MD, TREY ([id number]) on 02/12/2013 12:02:50 PM   Antibiotics: Antibiotics Given (last 72 hours)   None      Assessment & Plan by Problem: Active Problems:   Hypokalemia   Pt is a 79 y.o. female w/ PMH significant for CVA (L. MCA, 2009),  dysphagia (2/2 CVA), dementia, COPD (unknown stage), HTN, systolic HF (EF 40-45% in 9/14) and HOCM, paroxysmal A-fib, symptomatic bradycardia w/ PPM who presents with CP.    #Atypical Chest Pain- Pt with new onset CP according to aide this AM.  Portable CXR revealed findings compatible with moderate heart failure with moderate right and small left pleural effusion. -cycle troponins -sublingual NTG   #Hypokalemia-  -repleting IV  #COPD- Stable. -continue xopenex  #Protein-energy malnutrition- Pt known to Dr. Dellie Catholic on comfort feeds despite chronic aspiration.   -NPO  #Chronic Dysphagia- Pt w/ known history of dysphagia 2/2 CVA in 2009,  however, significantly worse since 01/09/13. H/o aspiration pneumonia. Previously considered PEG during last admission but received PICC instead.   -NPO  #Chronic systolic and diastolic heart failure -Pt appears fluid overloaded on exam; pt has significant 2+pitting edema b/l.  Discontinued lasix and spironolactone during previous admission. -monitor  #Paroxysmal AF on Coumadin- d/c coumadin; HR 62.  Amiodorone d/c on previous admission.  Pt not receiving at home. -monitor VS  #HTN- Controlled. -monitor VS   #VTE px -Lovenox 30mg  sq   #FEN- D5 1/2NS @75ml /hr   #Dispo- Disposition is deferred at this time, awaiting CE.  Anticipated discharge tomorrow.   The patient does have a current PCP [facility]) and does need an [facility] hospital follow-up appointment after discharge.  Signed: Boykin Peek, MD 02/12/2013, 6:46 PM

## 2013-02-12 NOTE — ED Notes (Signed)
Per EMS - pt had sudden onset of CP this morning, was given 1 Nitro at home, no relief. Pt reports cough at home. Hx of MI. Pt has thrush. PICC line in left arm, has been getting Difulcan IV. 12 lead showing LBBB, pt has pacemaker not showing on EKG. EMS didn't give ASA because unsure if pt could swallow it. Pt wears O2 at 2.5 liters at home. Nad, skin warm and dry, resp e/u.

## 2013-02-12 NOTE — ED Notes (Signed)
1st bag of potassium finished.

## 2013-02-12 NOTE — ED Notes (Signed)
Spoke with pharmacy, d51/2NS is compatible with potassium

## 2013-02-12 NOTE — ED Notes (Signed)
Caregivers are leaving but will be back shortly.

## 2013-02-13 DIAGNOSIS — I5042 Chronic combined systolic (congestive) and diastolic (congestive) heart failure: Secondary | ICD-10-CM

## 2013-02-13 DIAGNOSIS — R0789 Other chest pain: Principal | ICD-10-CM

## 2013-02-13 DIAGNOSIS — R1310 Dysphagia, unspecified: Secondary | ICD-10-CM

## 2013-02-13 DIAGNOSIS — J449 Chronic obstructive pulmonary disease, unspecified: Secondary | ICD-10-CM

## 2013-02-13 DIAGNOSIS — I509 Heart failure, unspecified: Secondary | ICD-10-CM

## 2013-02-13 LAB — TROPONIN I
Troponin I: <0.3 ng/mL (ref <0.30)
Troponin I: <0.3 ng/mL (ref <0.30)

## 2013-02-13 LAB — BASIC METABOLIC PANEL
CO2: 39 mEq/L — ABNORMAL HIGH (ref 19–32)
Calcium: 7.7 mg/dL — ABNORMAL LOW (ref 8.4–10.5)
GFR calc Af Amer: 87 mL/min — ABNORMAL LOW (ref >90)
GFR calc non Af Amer: 75 mL/min — ABNORMAL LOW (ref >90)
Glucose, Bld: 116 mg/dL — ABNORMAL HIGH (ref 70–99)
Potassium: 3.1 mEq/L — ABNORMAL LOW (ref 3.5–5.1)
Sodium: 151 mEq/L — ABNORMAL HIGH (ref 135–145)

## 2013-02-13 LAB — GLUCOSE, CAPILLARY
Glucose-Capillary: 117 mg/dL — ABNORMAL HIGH (ref 70–99)
Glucose-Capillary: 98 mg/dL (ref 70–99)

## 2013-02-13 MED ORDER — BIOTENE DRY MOUTH MT LIQD
15.0000 mL | Freq: Two times a day (BID) | OROMUCOSAL | Status: DC
  Administered 2013-02-13: 15 mL via OROMUCOSAL

## 2013-02-13 MED ORDER — CHLORHEXIDINE GLUCONATE 0.12 % MT SOLN
15.0000 mL | Freq: Two times a day (BID) | OROMUCOSAL | Status: DC
  Administered 2013-02-13: 15 mL via OROMUCOSAL
  Filled 2013-02-13 (×3): qty 15

## 2013-02-13 MED ORDER — HEPARIN SOD (PORK) LOCK FLUSH 100 UNIT/ML IV SOLN
250.0000 [IU] | INTRAVENOUS | Status: AC | PRN
  Administered 2013-02-13: 500 [IU]

## 2013-02-13 NOTE — Progress Notes (Signed)
CSW (Clinical Social Worker received called from pt nurse that pt is ready for dc and will need non-emergent ambulance transport home. CSW confirmed address [address] with caregiver in pt room and arranged transport. CSW signing off.  Alper Guilmette, LCSWA [phone]

## 2013-02-13 NOTE — Progress Notes (Signed)
Subjective: Patient seen and examined at the bedside. Two caretakers are present. The patient is alert and nodding yes and no to questions. She denies pain. Caretakers say her daughter left for [facility] yesterday, so they think her chest pain may have been anxiety related. They are worried about her urinary habits, saying she has been going about 12 hours between voids. Last night they expressed this concern to the night team who ordered a bladder scan showing 443cc. She was then in and out cathed with 400cc urine obtained. Apparently family has not pursued the outpatient palliative referral we arranged last admission.  Objective: Vital signs in last 24 hours: Filed Vitals:   02/12/13 1852 02/12/13 2049 02/13/13 0022 02/13/13 0700  BP: 143/56 141/69  150/65  Pulse: 78 70  67  Temp: 98.5 F (36.9 C) 97.6 F (36.4 C)  97.4 F (36.3 C)  TempSrc: Oral Oral    Resp: 16   18  Height: 5\' 5"  (1.651 m)     Weight: 116 lb 6.5 oz (52.8 kg)     SpO2: 91% 100% 100% 100%   Weight change:   Intake/Output Summary (Last 24 hours) at 02/13/13 1610 Last data filed at 02/12/13 2100  Gross per 24 hour  Intake      0 ml  Output    400 ml  Net   -400 ml    Physical Exam  Constitutional: She appears malnourished. She appears unhealthy. She appears cachectic. Non-toxic appearance. No distress.  HENT:  Head: Normocephalic and atraumatic.  Mouth/Throat: Mucous membranes are dry. No oral lesions.  Eyes: Conjunctivae are normal.  Cardiovascular: Normal rate, regular rhythm, normal heart sounds and intact distal pulses. Exam reveals no gallop and no friction rub.  No murmur heard. b/l pitting edema, stable from last admission Pulmonary/Chest: Effort normal. No accessory muscle usage. No respiratory distress. She has no wheezes. She has rales. She exhibits no tenderness.  Pt has upper airway gurgling sounds  Abdominal: Soft. Bowel sounds are normal. She exhibits no distension. There is no tenderness.  No suprapubic tenderness. Skin: Skin is warm and dry. Ecchymosis (scattered) noted. She is not diaphoretic.    Lab Results: Basic Metabolic Panel:  Recent Labs Lab 02/12/13 1750 02/13/13 0500  NA 149* 151*  K 3.7 3.1*  CL 106 110  CO2 37* 39*  GLUCOSE 79 116*  BUN 22 20  CREATININE 0.72 0.64  CALCIUM 8.3* 7.7*   Liver Function Tests:  Recent Labs Lab 02/12/13 1750  AST 43*  ALT 33  ALKPHOS 127*  BILITOT 0.7  PROT 6.7  ALBUMIN 1.9*   No results found for this basename: LIPASE, AMYLASE,  in the last 168 hours No results found for this basename: AMMONIA,  in the last 168 hours CBC:  Recent Labs Lab 02/12/13 1129  WBC 8.4  HGB 9.8*  HCT 32.0*  MCV 99.1  PLT 97*   Cardiac Enzymes:  Recent Labs Lab 02/12/13 1750 02/12/13 2307 02/13/13 0507  TROPONINI <0.30 <0.30 <0.30   BNP:  Recent Labs Lab 02/12/13 1129  PROBNP 8489.0*   D-Dimer: No results found for this basename: DDIMER,  in the last 168 hours CBG:  Recent Labs Lab 02/12/13 2046 02/13/13 0013 02/13/13 0543 02/13/13 0732  GLUCAP 95 98 114* 108*   Hemoglobin A1C: No results found for this basename: HGBA1C,  in the last 168 hours Fasting Lipid Panel: No results found for this basename: CHOL, HDL, LDLCALC, TRIG, CHOLHDL, LDLDIRECT,  in the last  168 hours Thyroid Function Tests: No results found for this basename: TSH, T4TOTAL, FREET4, T3FREE, THYROIDAB,  in the last 168 hours Coagulation:  Recent Labs Lab 02/12/13 1231  LABPROT 27.6*  INR 2.68*   Anemia Panel: No results found for this basename: VITAMINB12, FOLATE, FERRITIN, TIBC, IRON, RETICCTPCT,  in the last 168 hours Urine Drug Screen: Drugs of Abuse  No results found for this basename: labopia,  cocainscrnur,  labbenz,  amphetmu,  thcu,  labbarb    Alcohol Level: No results found for this basename: ETH,  in the last 168 hours Urinalysis:  Recent Labs Lab 02/12/13 2144  COLORURINE YELLOW  LABSPEC 1.013  PHURINE 6.0    GLUCOSEU 250*  HGBUR NEGATIVE  BILIRUBINUR NEGATIVE  KETONESUR NEGATIVE  PROTEINUR NEGATIVE  UROBILINOGEN 0.2  NITRITE NEGATIVE  LEUKOCYTESUR NEGATIVE    Micro Results: No results found for this or any previous visit (from the past 240 hour(s)). Studies/Results: Dg Chest Port 1 View  02/12/2013   CLINICAL DATA:  Chest pain.  Short of breath.  EXAM: PORTABLE CHEST - 1 VIEW  COMPARISON:  01/30/2013.  FINDINGS: Cardiopericardial silhouette is partially obscured but appears enlarged. Tortuous atherosclerotic thoracic aorta. Old right rib fractures. Two lead left subclavian pacemaker. Bilateral pleural effusions are present. Right pleural effusion is moderate and left pleural fusion is mild. Dependent atelectasis is present both lung bases. There is pulmonary vascular congestion and interstitial and alveolar pulmonary edema compatible with moderate CHF.  IMPRESSION: Constellation of findings compatible with moderate CHF. Moderate right and small left pleural effusion.   Electronically Signed   By: Andreas Newport M.D.   On: 02/12/2013 12:34   Medications: I have reviewed the patient's current medications. Scheduled Meds: . antiseptic oral rinse  15 mL Mouth Rinse q12n4p  . chlorhexidine  15 mL Mouth Rinse BID  . fluconazole  100 mg Intravenous Q24H  . magic mouthwash  5 mL Oral QID   Continuous Infusions: . dextrose 5 % and 0.45% NaCl 1,000 mL (02/13/13 0803)   PRN Meds:.levalbuterol, nitroGLYCERIN Assessment/Plan: Pt is a 79 y.o. female w/ PMH significant for CVA (L. MCA, 2009), dysphagia (2/2 CVA), dementia, COPD (unknown stage), HTN, systolic HF (EF 40-45% in 9/14) and HOCM, paroxysmal A-fib, symptomatic bradycardia w/ PPM who presents with CP.   #Atypical Chest Pain- Pt with new onset CP according to aide this AM, thought patient denies it. Portable CXR revealed findings compatible with moderate heart failure with moderate right and small left pleural effusion. EKG unchanged from  [facility]. No new ST/T wave changes. - Cycle troponins > Negative x3 - Touched base with [facility] > Family did not return their outreach. I asked them to try again next week to see if family has changed their mind. - Medically stable for discharge today to home with caretakers  #Hypokalemia - Patient is s/p IV   Potassium  Date Value Range Status  02/13/2013 3.1* 3.5 - 5.1 mEq/L Final  02/12/2013 3.7  3.5 - 5.1 mEq/L Final     DELTA CHECK NOTED     SLIGHT HEMOLYSIS  02/12/2013 2.8* 3.5 - 5.1 mEq/L Final    #Anxiety - Patient would likely benefit from prn benzodiazepine therapy. - Will advice PCP to consider IV ativan 0.5mg  q4h prn (family has resisted sedating therapies in the past)  #Bladdar dysfunction? - No suprapubic tenderness. Not true urinary retention as bladder scans do not show large volume of urine. No substantial post-void residual. Cr is within normal limits. Likely  just fewer voids from dehydration. UA is clean. Care takers were counseled.  #COPD - Stable.  - Continue Xopenex nebulizers  #Protein-energy malnutrition - Pt known to Dr. Phillips Odor - Family decided on comfort feeds despite chronic aspiration. They have been resistant to hospice referral. - NPO unless fami - Will advice  #Chronic dysphagia - Pt w/ known history of dysphagia 2/2 CVA in 2009, however, significantly worse since 01/09/13. H/o aspiration pneumonia. Previously considered PEG during last admission but received PICC instead. Extensive discussion with family occurred last admission to help them understand the consequences of feeding in a patient who is a definite aspiration risk. - NPO, unless family wants to pursue comfort feeds - Will advice PCP to consider vitamin supplementation  #Chronic systolic and diastolic heart failure -Pt appears fluid overloaded on exam; pt has significant 2+pitting edema b/l. Discontinued lasix and spironolactone during previous admission.  - Continue to monitor    #Paroxysmal AF on Coumadin - d/c coumadin; HR 62. Amiodorone d/c on previous admission. Pt not receiving at home. INR is therapeutic at 2.68. - Monitor VS   #HTN- Controlled.  - Monitor VS   #VTE px - D/c Lovenox given supratherapeutic INR (2.68)  #FEN - D5 1/2NS @10 -80ml/hr   #Code status - DNR/DNI   Dispo: Disposition is deferred at this time, awaiting improvement of current medical problems.  Anticipated discharge in approximately 1-3 day(s).   The patient does have a current PCP (Provider Default, MD) and does need an [facility] hospital follow-up appointment after discharge.  The patient does not have transportation limitations that hinder transportation to clinic appointments.  .Services Needed at time of discharge: Y = Yes, Blank = No PT:   OT:   RN:   Equipment:   Other:     LOS: 1 day   Vivi Barrack, MD 02/13/2013, 8:37 AM

## 2013-02-13 NOTE — H&P (Signed)
  Date: 02/13/2013  Patient name: DIA DONATE  Medical record number: [id number]  Date of birth: 1918/08/24   I have seen and evaluated Marcella Dubs and discussed their care with the Residency Team.  I saw Ms. Kracke with the team.  She was resting comfortably in bed, no acute distress, answering yes and no questions.  She denied any pain.    Assessment and Plan: I have seen and evaluated the patient as outlined above. I agree with the formulated Assessment and Plan as detailed in the residents' admission note, with the following changes:   1. Atypical chest pain: Will plan to rule out for acute ACS with monitor of TnI and EKG.  CXR as noted in resident note.    2. Concern for decreased UOP: Patient has also had significant decrease in intake given her stroke and dysphagia.  We will do a PVR to evaluate for retention.    3. Hypokalemia, elevated INR: Likely due to decreased PO intake.  She may benefit from vitamin and mineral supplementation as an outpatient as she is not receiving much PO and has difficulty with dysphagia.  Will give this recommendation to her primary physician. Will replace K today and consider Vitamin K supplementation as well.   Other issues are documented in resident note.   Inez Catalina, MD 12/19/20142:51 PM

## 2013-02-13 NOTE — Progress Notes (Signed)
Pt's caregiver stated pt had not voided since 4 am. Bladder scan done showing 443 cc of urine. Md on call made aware. In and out cath done per MD order 400 cc of urine obtained. Will cont to monitor pt.

## 2013-02-13 NOTE — Discharge Summary (Signed)
Name: Dawn Barrett MRN: [id number] DOB: 02/22/1919 79 y.o. PCP: Provider Default, MD  Date of Admission: 02/12/2013 11:13 AM Date of Discharge: 02/13/2013 Attending Physician: Farley Ly, MD  Discharge Diagnosis: 1. Atypical Chest Pain 2. Hypokalemia  3. COPD 4. Protein-energy malnutrition 5. Chronic dysphagia 6. Chronic systolic and diastolic heart failure 7. Paroxysmal AF 8. HTN  Discharge Medications:   Medication List    STOP taking these medications       amiodarone 200 MG tablet  Commonly known as:  PACERONE     spironolactone 25 MG tablet  Commonly known as:  ALDACTONE      TAKE these medications       dextrose 5 % and 0.45% NaCl infusion  Inject 15 mL/hr into the vein continuous.     fluconazole 2 mg/mL Soln IVPB  Commonly known as:  DIFLUCAN  Inject 50 mLs (100 mg total) into the vein daily.     furosemide 8 MG/ML solution  Commonly known as:  LASIX  Take 5 mg by mouth daily.     levalbuterol 0.63 MG/3ML nebulizer solution  Commonly known as:  XOPENEX  Take 3 mLs (0.63 mg total) by nebulization every 6 (six) hours as needed for wheezing.     magic mouthwash Soln  Take 5 mLs by mouth 4 (four) times daily.     nitroGLYCERIN 0.6 MG SL tablet  Commonly known as:  NITROSTAT  Place 0.6 mg under the tongue every 5 (five) minutes as needed for chest pain.        Disposition and follow-up:   Ms.Micaiah B Dannenberg was discharged from [facility] in Stable condition.  At the hospital follow up visit please address:  1.  Progress on palliative care referral? Need for IV K, vitamin supplementation give decreased PO intake? Consider PRN benzodiazepine for anxiety if family amenable.  2.  Labs / imaging needed at time of follow-up: None  3.  Pending labs/ test needing follow-up: None  Follow-up Appointments: Follow-up Information   Follow up with Default, Provider, MD. Schedule an appointment as soon as possible for a  visit in 1 week. (Please follow up with [facility])    Contact information:   [address] [place] [address] [phone]       Discharge Instructions: Discharge Orders   Future Orders Complete By Expires   Diet - low sodium heart healthy  As directed    Increase activity slowly  As directed       Consultations:  None  Procedures Performed:  Dg Chest 2 View  01/28/2013   CLINICAL DATA:  Respiratory distress.  EXAM: CHEST  2 VIEW  COMPARISON:  January 11, 2013.  FINDINGS: Stable cardiomegaly. Bilateral pleural effusions are again noted. Left-sided pacemaker is unchanged in position. Severe thoracic kyphosis is noted with stable compression deformity of mid thoracic vertebral body. Bilateral basilar opacities are noted concerning for atelectasis or other consolidation. No pneumothorax is noted.  IMPRESSION: Stable cardiomegaly and bilateral pleural effusions, with associated bilateral basilar atelectasis or consolidation.   Electronically Signed   By: Roque Lias M.D.   On: 01/28/2013 13:45   Dg Chest Port 1 View  02/12/2013   CLINICAL DATA:  Chest pain.  Short of breath.  EXAM: PORTABLE CHEST - 1 VIEW  COMPARISON:  01/30/2013.  FINDINGS: Cardiopericardial silhouette is partially obscured but appears enlarged. Tortuous atherosclerotic thoracic aorta. Old right rib fractures. Two lead left subclavian pacemaker. Bilateral pleural  effusions are present. Right pleural effusion is moderate and left pleural fusion is mild. Dependent atelectasis is present both lung bases. There is pulmonary vascular congestion and interstitial and alveolar pulmonary edema compatible with moderate CHF.  IMPRESSION: Constellation of findings compatible with moderate CHF. Moderate right and small left pleural effusion.   Electronically Signed   By: Andreas Newport M.D.   On: 02/12/2013 12:34   Dg Chest Port 1 View  01/30/2013   CLINICAL DATA:  Short of breath  EXAM: PORTABLE CHEST - 1 VIEW   COMPARISON:  01/28/2013  FINDINGS: Bilateral effusions and bibasilar atelectasis unchanged. Chronic lung disease is noted.  Right upper lobe airspace disease appears more prominent, possible pneumonia  IMPRESSION: Possible right upper lobe pneumonia or edema.  No change bibasilar atelectasis and effusion.   Electronically Signed   By: Marlan Palau M.D.   On: 01/30/2013 13:08     Admission HPI:  Pt is a 79 y.o. female who lives with her daughter and is completely bedbound. She has a PMH significant for CVA (L. MCA, 2009), dysphagia (2/2 CVA), dementia, COPD (unknown stage), HTN, systolic HF (EF 40-45% in 9/14) and HOCM, paroxysmal A-fib, symptomatic bradycardia w/ PPM. She is accompanied by a home health aide who provided the history. She was brought in for evaluation of chest pain that began this morning. The aide said she looked uncomfortable and asked if she had chest pain and the pt nodded her head. The pt denied any current CP when asked. The aide denied any associated symptoms of N/V or diaphoresis. She also has experienced decreased appetite for the past couple of days, is less responsive, and with productive cough (chronic). She typically eats small amounts pureed foods and has an appetite on most days, but since Tuesday she has been eating less and has difficulty swallowing foods completely w/ repeated regurgitation. The aide noticed she developed more audible ronchi.  On reviewing the records, the pt has a h/o chronic aspiration. She was evaluated by GI during her last admission and they did not recommend any further intervention. Pt had similar admissions for acute on chronic dysphasia. She has been admitted 5 times in the past 6 months. GI was consulted during her last admission and has been treated for oral candidiasis. She is being treated with Diflucan IV through a PICC line. Pt was also previously admitted on 12/04/12 for acute on chronic respiratory failure most likely d/t aspiration  pneumonia, acute encephalopathy, and dehydration. Seen by Dr. Juanda Chance at this time to discuss PEG vs PICC placement. PICC was placed, PEG was refused, and patent was discharged.  Of note, Dr. Claudette Stapler spoke with the pt's daughter "Rod Holler" who apparently is her HCPOA. She told Dr. Zada Girt that she was DNR/DNI.  Physical Exam  Constitutional: She appears malnourished. She appears unhealthy. She appears cachectic. Non-toxic appearance. No distress.  HENT:  Head: Normocephalic and atraumatic.  Mouth/Throat: Mucous membranes are dry. No oral lesions.  Eyes: Conjunctivae are normal.  Cardiovascular: Normal rate, regular rhythm, normal heart sounds and intact distal pulses. Exam reveals no gallop and no friction rub.  No murmur heard. b/l pitting edema  Pulmonary/Chest: Effort normal. No accessory muscle usage. No respiratory distress. She has no wheezes. She has rales. She exhibits no tenderness.  Pt has upper airway gurgling sounds  Abdominal: Soft. Bowel sounds are normal. She exhibits no distension. There is no tenderness.  Skin: Skin is warm and dry. Ecchymosis (scattered) noted. She is not diaphoretic.  Hospital Course by problem list: Pt is a 79 y.o. female w/ PMH significant for CVA (L. MCA, 2009), dysphagia (2/2 CVA), dementia, COPD (unknown stage), HTN, systolic HF (EF 40-45% in 9/14) and HOCM, paroxysmal A-fib, symptomatic bradycardia w/ PPM who presents with CP.   1. Atypical Chest Pain - Patient with new onset CP according to aide, thought patient denies it on interviews. Portable CXR revealed findings compatible with moderate heart failure with moderate right and small left pleural effusion, not significantly changed from prior studies. EKG unchanged from priors. No new ST/T wave changes. We cycled troponins > Negative x3. Aides felt patient was perhaps anxious about daughter leaving directly prior to symptom onset. I touched base with [facility] services, which I  referred the patient to last admission. Family apparently did not return their outreach after last discharge. I asked them to try calling again next week to see if family has changed their mind or becoming more accepting of end of life care. Patient was discharged in stable condition in the care of her caretakers. I would advice her PCP to consider IV ativan prn for anxiety, though I know that the family has resisted sedating therapies for her in the past.  2. Hypokalemia - Patient was hypokalemic on admission with K=2.8. Improved to K=3.7 s/p 10 Meq IV.  3. Concern over bladdar dysfunction - Aides were concerned that the patient was going 12 hours without voiding. She had no suprapubic tenderness on exam. Bladder scan did not show large volume of urine (~400cc). No substantial post-void residual after in and out cath. Cr is within normal limits. Likely just fewer voids from dehydration. UA is clean. Care takers were counseled to expect the patient to go longer periods without voiding while she is eating so little.  4. COPD - Stable. We continued Xopenex nebulizers.  5. Protein-energy malnutrition - Patient is known to Dr. Phillips Odor. Last admission family decided on comfort feeds and were counseled extensively on the inevitability of chronic aspiration. They have been resistant to hospice referral. She may benefit from additional vitamin supplementation if the family wants it.  7. Chronic dysphagia - Pt w/ known history of dysphagia 2/2 CVA in 2009, however, significantly worse since 01/09/13. H/o aspiration pneumonia. Previously considered PEG but family declined and she received a PICC instead. Extensive discussion with family occurred last admission to help them understand the consequences of feeding in a patient who is a definite aspiration risk. GI was consulted and had nothing further to offer.  8. Chronic systolic and diastolic heart failure - EF 40-45% in 9/14. Stable 2+ pitting edema bilaterally.  Lung exam unchanged from prior admission. Satting well on home 2L [place]. Discontinued lasix and spironolactone during previous admission due to inability to tolerate po. She may continue oral lasix if the family wishes, so I have left it on the medication list.  9. Paroxysmal AF - She supposedly has not been taking oral coumadin at home. Today HR 60-70s and normal sinus rhythm. INR is therapeutic at 2.68, perhaps this is due to vitamin K deficiency. PCP may consider additional vitamin K supplementation as outpatient if family wishes.  10. HTN - Controlled.    Discharge Vitals:   BP 150/65  Pulse 67  Temp(Src) 97.4 F (36.3 C) (Oral)  Resp 18  Ht 5\' 5"  (1.651 m)  Wt 116 lb 6.5 oz (52.8 kg)  BMI 19.37 kg/m2  SpO2 100%  Discharge Labs:  Results for orders placed during the hospital encounter  of 02/12/13 (from the past 24 hour(s))  CBC     Status: Abnormal   Collection Time    02/12/13 11:29 AM      Result Value Range   WBC 8.4  4.0 - 10.5 K/uL   RBC 3.23 (*) 3.87 - 5.11 MIL/uL   Hemoglobin 9.8 (*) 12.0 - 15.0 g/dL   HCT 29.5 (*) 28.4 - 13.2 %   MCV 99.1  78.0 - 100.0 fL   MCH 30.3  26.0 - 34.0 pg   MCHC 30.6  30.0 - 36.0 g/dL   RDW 44.0 (*) 10.2 - 72.5 %   Platelets 97 (*) 150 - 400 K/uL  BASIC METABOLIC PANEL     Status: Abnormal   Collection Time    02/12/13 11:29 AM      Result Value Range   Sodium 149 (*) 135 - 145 mEq/L   Potassium 2.8 (*) 3.5 - 5.1 mEq/L   Chloride 106  96 - 112 mEq/L   CO2 39 (*) 19 - 32 mEq/L   Glucose, Bld 90  70 - 99 mg/dL   BUN 21  6 - 23 mg/dL   Creatinine, Ser 3.66  0.50 - 1.10 mg/dL   Calcium 8.2 (*) 8.4 - 10.5 mg/dL   GFR calc non Af Amer 73 (*) >90 mL/min   GFR calc Af Amer 85 (*) >90 mL/min  PRO B NATRIURETIC PEPTIDE     Status: Abnormal   Collection Time    02/12/13 11:29 AM      Result Value Range   Pro B Natriuretic peptide (BNP) 8489.0 (*) 0 - 450 pg/mL  TROPONIN I     Status: None   Collection Time    02/12/13 11:29 AM       Result Value Range   Troponin I <0.30  <0.30 ng/mL  POCT I-STAT TROPONIN I     Status: Abnormal   Collection Time    02/12/13 11:44 AM      Result Value Range   Troponin i, poc 0.09 (*) 0.00 - 0.08 ng/mL   Comment NOTIFIED PHYSICIAN     Comment 3           PROTIME-INR     Status: Abnormal   Collection Time    02/12/13 12:31 PM      Result Value Range   Prothrombin Time 27.6 (*) 11.6 - 15.2 seconds   INR 2.68 (*) 0.00 - 1.49  HEPATIC FUNCTION PANEL     Status: Abnormal   Collection Time    02/12/13  5:50 PM      Result Value Range   Total Protein 6.7  6.0 - 8.3 g/dL   Albumin 1.9 (*) 3.5 - 5.2 g/dL   AST 43 (*) 0 - 37 U/L   ALT 33  0 - 35 U/L   Alkaline Phosphatase 127 (*) 39 - 117 U/L   Total Bilirubin 0.7  0.3 - 1.2 mg/dL   Bilirubin, Direct 0.2  0.0 - 0.3 mg/dL   Indirect Bilirubin 0.5  0.3 - 0.9 mg/dL  TROPONIN I     Status: None   Collection Time    02/12/13  5:50 PM      Result Value Range   Troponin I <0.30  <0.30 ng/mL  BASIC METABOLIC PANEL     Status: Abnormal   Collection Time    02/12/13  5:50 PM      Result Value Range   Sodium 149 (*) 135 - 145 mEq/L  Potassium 3.7  3.5 - 5.1 mEq/L   Chloride 106  96 - 112 mEq/L   CO2 37 (*) 19 - 32 mEq/L   Glucose, Bld 79  70 - 99 mg/dL   BUN 22  6 - 23 mg/dL   Creatinine, Ser 4.09  0.50 - 1.10 mg/dL   Calcium 8.3 (*) 8.4 - 10.5 mg/dL   GFR calc non Af Amer 72 (*) >90 mL/min   GFR calc Af Amer 84 (*) >90 mL/min  GLUCOSE, CAPILLARY     Status: None   Collection Time    02/12/13  8:46 PM      Result Value Range   Glucose-Capillary 95  70 - 99 mg/dL  URINALYSIS, ROUTINE W REFLEX MICROSCOPIC     Status: Abnormal   Collection Time    02/12/13  9:44 PM      Result Value Range   Color, Urine YELLOW  YELLOW   APPearance CLEAR  CLEAR   Specific Gravity, Urine 1.013  1.005 - 1.030   pH 6.0  5.0 - 8.0   Glucose, UA 250 (*) NEGATIVE mg/dL   Hgb urine dipstick NEGATIVE  NEGATIVE   Bilirubin Urine NEGATIVE  NEGATIVE    Ketones, ur NEGATIVE  NEGATIVE mg/dL   Protein, ur NEGATIVE  NEGATIVE mg/dL   Urobilinogen, UA 0.2  0.0 - 1.0 mg/dL   Nitrite NEGATIVE  NEGATIVE   Leukocytes, UA NEGATIVE  NEGATIVE  TROPONIN I     Status: None   Collection Time    02/12/13 11:07 PM      Result Value Range   Troponin I <0.30  <0.30 ng/mL  GLUCOSE, CAPILLARY     Status: None   Collection Time    02/13/13 12:13 AM      Result Value Range   Glucose-Capillary 98  70 - 99 mg/dL  BASIC METABOLIC PANEL     Status: Abnormal   Collection Time    02/13/13  5:00 AM      Result Value Range   Sodium 151 (*) 135 - 145 mEq/L   Potassium 3.1 (*) 3.5 - 5.1 mEq/L   Chloride 110  96 - 112 mEq/L   CO2 39 (*) 19 - 32 mEq/L   Glucose, Bld 116 (*) 70 - 99 mg/dL   BUN 20  6 - 23 mg/dL   Creatinine, Ser 8.11  0.50 - 1.10 mg/dL   Calcium 7.7 (*) 8.4 - 10.5 mg/dL   GFR calc non Af Amer 75 (*) >90 mL/min   GFR calc Af Amer 87 (*) >90 mL/min  TROPONIN I     Status: None   Collection Time    02/13/13  5:07 AM      Result Value Range   Troponin I <0.30  <0.30 ng/mL  GLUCOSE, CAPILLARY     Status: Abnormal   Collection Time    02/13/13  5:43 AM      Result Value Range   Glucose-Capillary 114 (*) 70 - 99 mg/dL  GLUCOSE, CAPILLARY     Status: Abnormal   Collection Time    02/13/13  7:32 AM      Result Value Range   Glucose-Capillary 108 (*) 70 - 99 mg/dL   Comment 1 Documented in Chart     Comment 2 Notify RN      Signed: Vivi Barrack, MD 02/13/2013, 11:18 AM   Time Spent on Discharge: 20 minutes Services Ordered on Discharge: None Equipment Ordered on Discharge: None

## 2013-02-15 ENCOUNTER — Inpatient Hospital Stay (HOSPITAL_COMMUNITY)
Admission: EM | Admit: 2013-02-15 | Discharge: 2013-02-19 | DRG: 640 | Disposition: A | Discharge status: Home Health | Payer: Medicare Other | Attending: Internal Medicine | Admitting: Internal Medicine

## 2013-02-15 ENCOUNTER — Encounter (HOSPITAL_COMMUNITY): Payer: Self-pay | Admitting: Emergency Medicine

## 2013-02-15 ENCOUNTER — Emergency Department (HOSPITAL_COMMUNITY): Payer: Medicare Other

## 2013-02-15 DIAGNOSIS — I252 Old myocardial infarction: Secondary | ICD-10-CM

## 2013-02-15 DIAGNOSIS — IMO0002 Reserved for concepts with insufficient information to code with codable children: Secondary | ICD-10-CM

## 2013-02-15 DIAGNOSIS — J449 Chronic obstructive pulmonary disease, unspecified: Secondary | ICD-10-CM | POA: Diagnosis present

## 2013-02-15 DIAGNOSIS — Z950 Presence of cardiac pacemaker: Secondary | ICD-10-CM

## 2013-02-15 DIAGNOSIS — I079 Rheumatic tricuspid valve disease, unspecified: Secondary | ICD-10-CM | POA: Diagnosis present

## 2013-02-15 DIAGNOSIS — E876 Hypokalemia: Secondary | ICD-10-CM

## 2013-02-15 DIAGNOSIS — E870 Hyperosmolality and hypernatremia: Principal | ICD-10-CM | POA: Diagnosis present

## 2013-02-15 DIAGNOSIS — I480 Paroxysmal atrial fibrillation: Secondary | ICD-10-CM | POA: Diagnosis present

## 2013-02-15 DIAGNOSIS — I5042 Chronic combined systolic (congestive) and diastolic (congestive) heart failure: Secondary | ICD-10-CM

## 2013-02-15 DIAGNOSIS — I509 Heart failure, unspecified: Secondary | ICD-10-CM | POA: Diagnosis present

## 2013-02-15 DIAGNOSIS — I10 Essential (primary) hypertension: Secondary | ICD-10-CM | POA: Diagnosis present

## 2013-02-15 DIAGNOSIS — I421 Obstructive hypertrophic cardiomyopathy: Secondary | ICD-10-CM | POA: Diagnosis present

## 2013-02-15 DIAGNOSIS — Z9981 Dependence on supplemental oxygen: Secondary | ICD-10-CM

## 2013-02-15 DIAGNOSIS — I5030 Unspecified diastolic (congestive) heart failure: Secondary | ICD-10-CM | POA: Diagnosis present

## 2013-02-15 DIAGNOSIS — B370 Candidal stomatitis: Secondary | ICD-10-CM | POA: Diagnosis present

## 2013-02-15 DIAGNOSIS — E860 Dehydration: Secondary | ICD-10-CM

## 2013-02-15 DIAGNOSIS — I69991 Dysphagia following unspecified cerebrovascular disease: Secondary | ICD-10-CM

## 2013-02-15 DIAGNOSIS — I69959 Hemiplegia and hemiparesis following unspecified cerebrovascular disease affecting unspecified side: Secondary | ICD-10-CM

## 2013-02-15 DIAGNOSIS — R64 Cachexia: Secondary | ICD-10-CM | POA: Diagnosis present

## 2013-02-15 DIAGNOSIS — R1310 Dysphagia, unspecified: Secondary | ICD-10-CM | POA: Diagnosis present

## 2013-02-15 DIAGNOSIS — E43 Unspecified severe protein-calorie malnutrition: Secondary | ICD-10-CM

## 2013-02-15 DIAGNOSIS — Z8249 Family history of ischemic heart disease and other diseases of the circulatory system: Secondary | ICD-10-CM

## 2013-02-15 DIAGNOSIS — Z79899 Other long term (current) drug therapy: Secondary | ICD-10-CM

## 2013-02-15 DIAGNOSIS — I4891 Unspecified atrial fibrillation: Secondary | ICD-10-CM | POA: Diagnosis present

## 2013-02-15 DIAGNOSIS — Z882 Allergy status to sulfonamides status: Secondary | ICD-10-CM

## 2013-02-15 DIAGNOSIS — C44621 Squamous cell carcinoma of skin of unspecified upper limb, including shoulder: Secondary | ICD-10-CM | POA: Diagnosis present

## 2013-02-15 DIAGNOSIS — Z886 Allergy status to analgesic agent status: Secondary | ICD-10-CM

## 2013-02-15 DIAGNOSIS — I059 Rheumatic mitral valve disease, unspecified: Secondary | ICD-10-CM | POA: Diagnosis present

## 2013-02-15 DIAGNOSIS — R627 Adult failure to thrive: Secondary | ICD-10-CM

## 2013-02-15 DIAGNOSIS — E878 Other disorders of electrolyte and fluid balance, not elsewhere classified: Secondary | ICD-10-CM

## 2013-02-15 DIAGNOSIS — J4489 Other specified chronic obstructive pulmonary disease: Secondary | ICD-10-CM | POA: Diagnosis present

## 2013-02-15 DIAGNOSIS — Z7401 Bed confinement status: Secondary | ICD-10-CM

## 2013-02-15 DIAGNOSIS — Z86718 Personal history of other venous thrombosis and embolism: Secondary | ICD-10-CM

## 2013-02-15 DIAGNOSIS — I69920 Aphasia following unspecified cerebrovascular disease: Secondary | ICD-10-CM

## 2013-02-15 HISTORY — DX: Hypo-osmolality and hyponatremia: E87.1

## 2013-02-15 LAB — POCT I-STAT, CHEM 8
BUN: 17 mg/dL (ref 6–23)
Calcium, Ion: 1.16 mmol/L (ref 1.13–1.30)
Chloride: 103 mEq/L (ref 96–112)
Creatinine, Ser: 0.8 mg/dL (ref 0.50–1.10)
HCT: 32 % — ABNORMAL LOW (ref 36.0–46.0)

## 2013-02-15 LAB — CBC WITH DIFFERENTIAL/PLATELET
Basophils Absolute: 0 10*3/uL (ref 0.0–0.1)
Eosinophils Absolute: 0 10*3/uL (ref 0.0–0.7)
Eosinophils Relative: 0 % (ref 0–5)
Lymphocytes Relative: 23 % (ref 12–46)
MCV: 100 fL (ref 78.0–100.0)
Neutro Abs: 5.2 10*3/uL (ref 1.7–7.7)
Neutrophils Relative %: 71 % (ref 43–77)
Platelets: 85 10*3/uL — ABNORMAL LOW (ref 150–400)
RDW: 17.1 % — ABNORMAL HIGH (ref 11.5–15.5)
WBC: 7.4 10*3/uL (ref 4.0–10.5)

## 2013-02-15 LAB — PRO B NATRIURETIC PEPTIDE: Pro B Natriuretic peptide (BNP): 9121 pg/mL — ABNORMAL HIGH (ref 0–450)

## 2013-02-15 MED ORDER — DEXTROSE-NACL 5-0.45 % IV SOLN
INTRAVENOUS | Status: DC
  Administered 2013-02-15: 23:00:00 via INTRAVENOUS

## 2013-02-15 MED ORDER — LEVALBUTEROL HCL 0.63 MG/3ML IN NEBU
0.6300 mg | INHALATION_SOLUTION | Freq: Four times a day (QID) | RESPIRATORY_TRACT | Status: DC | PRN
  Administered 2013-02-16 – 2013-02-19 (×4): 0.63 mg via RESPIRATORY_TRACT
  Filled 2013-02-15 (×4): qty 3

## 2013-02-15 MED ORDER — SODIUM CHLORIDE 0.9 % IV SOLN: 250.0000 mL | INTRAVENOUS | Status: DC | PRN

## 2013-02-15 MED ORDER — SODIUM CHLORIDE 0.9 % IJ SOLN
3.0000 mL | Freq: Two times a day (BID) | INTRAMUSCULAR | Status: DC
  Administered 2013-02-16: 3 mL via INTRAVENOUS

## 2013-02-15 MED ORDER — FLUCONAZOLE 100MG IVPB
100.0000 mg | INTRAVENOUS | Status: DC
  Administered 2013-02-16 – 2013-02-19 (×4): 100 mg via INTRAVENOUS
  Filled 2013-02-15 (×6): qty 50

## 2013-02-15 MED ORDER — SODIUM CHLORIDE 0.9 % IJ SOLN
3.0000 mL | Freq: Two times a day (BID) | INTRAMUSCULAR | Status: DC
  Administered 2013-02-16: 03:00:00 3 mL via INTRAVENOUS

## 2013-02-15 MED ORDER — NITROGLYCERIN 0.6 MG SL SUBL
0.6000 mg | SUBLINGUAL_TABLET | SUBLINGUAL | Status: DC | PRN
Start: 2013-02-15 — End: 2013-02-19

## 2013-02-15 MED ORDER — POTASSIUM CHLORIDE 10 MEQ/100ML IV SOLN: 10.0000 meq | INTRAVENOUS | Status: DC

## 2013-02-15 MED ORDER — ENOXAPARIN SODIUM 30 MG/0.3ML ~~LOC~~ SOLN
30.0000 mg | SUBCUTANEOUS | Status: DC
  Administered 2013-02-16 – 2013-02-17 (×2): 30 mg via SUBCUTANEOUS
  Filled 2013-02-15 (×2): qty 0.3

## 2013-02-15 MED ORDER — SODIUM CHLORIDE 0.9 % IJ SOLN: 3.0000 mL | INTRAMUSCULAR | Status: DC | PRN

## 2013-02-15 NOTE — ED Notes (Signed)
Admitting physician at bedside

## 2013-02-15 NOTE — H&P (Signed)
Attending physician: Dr. Criselda Peaches  Internal Medicine Teaching Service Contact Information  Weekday Hours (7AM-5PM):   1st Contact: Dr. Vivi Barrack          [phone] 2nd Contact: Dr. Leonia Reeves       Pager; [phone]  ** If no return call within 15 minutes (after trying both pagers listed above), please call after hours pagers.   After 5 pm or weekends: 1st Contact: Pager: [phone] 2nd Contact: Pager: [phone]  Chief Complaint: dehydration   History of Present Illness:  Pt is a 79 y.o. female who lives with her daughter and is completely bedbound. She has a PMH significant for CVA (L. MCA, 2009), chronic dysphagia and aspiration (2/2 CVA), COPD (on 2.5LPM O2), HTN, systolic and diastolic CHF (EF 95-62% in 9/14) and HOCM, paroxysmal A-fib w/ pacemaker. She is accompanied by a home health aide and her daughter who brought patient in today because she was not acting her usual self and they think she is dehydrated. They report that over the last two days patient has been breathing louder and making "gurgling" noises and she has been acting more "out of it" than usual. She has not been sleeping more than usual however. They are also concerned about patient having episodes of head and upper body "twitching" that last a few minutes and occur about 40 times per hour, there is no jerking to her extremities or tongue biting with these episodes. Patient has a PICC line and has been receiving D5LR w/ KCl at 15cc/hr continuously at home. She has been taking lasix 10mg  IV prn and has been doing this approximately every other day, last dose was yesterday. Patient has severe dysphagia that is a chronic issue and has not been eating much for the past 12 days. Patient has been admitted about 6 times for similar issues in the last 6 months. PEG placement has been offered, but family has refused and instead opted for PICC line combined w/ comfort feeds.  GI was consulted during an admission a few weeks ago  for oral candidiasis and patient continues treatment now w/ Diflucan IV through a PICC line. She is followed by physicians who make house calls.  Meds: No current facility-administered medications for this encounter.   Current Outpatient Prescriptions  Medication Sig Dispense Refill  . Alum & Mag Hydroxide-Simeth (MAGIC MOUTHWASH) SOLN Take 5 mLs by mouth 4 (four) times daily.  600 mL  11  . Dextrose-Sodium Chloride (DEXTROSE 5 % AND 0.45% NACL) infusion Inject 15 mL/hr into the vein continuous.      . fluconazole (DIFLUCAN) 100 MG tablet Take 100 mg by mouth daily.      . furosemide (LASIX) 8 MG/ML solution Inject 10 mg into the vein daily.       Marland Kitchen levalbuterol (XOPENEX) 0.63 MG/3ML nebulizer solution Take 3 mLs (0.63 mg total) by nebulization every 6 (six) hours as needed for wheezing.  3 mL  30  . nitroGLYCERIN (NITROSTAT) 0.6 MG SL tablet Place 0.6 mg under the tongue every 5 (five) minutes as needed for chest pain.        Allergies: Allergies as of 02/15/2013 - Review Complete 02/15/2013  Allergen Reaction Noted  . Codeine Other (See Comments)   . Levaquin [levofloxacin] Other (See Comments) 01/29/2013  . Sulfonamide derivatives Other (See Comments)    Past Medical History  Diagnosis Date  . Hypertension   . COPD (chronic obstructive pulmonary disease)   . Chronic combined systolic and diastolic CHF (  congestive heart failure)   . Symptomatic bradycardia     a. s/p St. Jude pacemaker 2009.  Marland Kitchen HOCM (hypertrophic obstructive cardiomyopathy)     a. 2D Echo 10/2012: EF 40-45%, mild LVH, mild-mod TR, mitral SAM with mod MR, LVOT gradient not well assessed but at least 2.44m/s; findings c/w HOCM.  Marland Kitchen Mitral regurgitation     a. Mod MR by echo 10/2012.  . Tricuspid regurgitation     a. Mild-mod TR by echo 10/2012.  Marland Kitchen Dysphagia   . Pacemaker   . Heart murmur   . Paroxysmal atrial fibrillation   . LBBB (left bundle branch block)   . NSTEMI (non-ST elevated myocardial infarction) 10/2012      a. 10/2012 - managed medically.  Marland Kitchen DVT (deep venous thrombosis)   . PNA (pneumonia)     a. Per notes, h/o pulm injury r/t PNA.  aspiration PNA 11/2012  . Aspiration pneumonia   . Shortness of breath     "all the time" (02/12/2013)  . On home oxygen therapy     "2L; 24/7" (02/12/2013)  . Borderline diabetes   . Anxiety   . Squamous cell cancer of skin of right shoulder     "recently dx'd" (02/12/2013)  . Stroke     a. L MCA CVA 2009.; "left her aphasic & w/dysphagia" (02/12/2013)   Past Surgical History  Procedure Laterality Date  . Femur surgery Right 06/2011    Rod inserted after a fall.   . Cholecystectomy    . Insert / replace / remove pacemaker  11/19/2007    St. Jude Zephyr 5020 pulse generator, serial [id number]   Family History  Problem Relation Age of Onset  . Heart disease Mother    History   Social History  . Marital Status: Widowed    Spouse Name: N/A    Number of Children: N/A  . Years of Education: N/A   Occupational History  . Retired    Social History Main Topics  . Smoking status: Never Smoker   . Smokeless tobacco: Never Used  . Alcohol Use: No  . Drug Use: No  . Sexual Activity: No   Other Topics Concern  . Not on file   Social History Narrative   Pt lives with daughter in [place] but has been in [place] recently.    Review of Systems: Unable to obtain 2/2 patient condition  Physical Exam: Blood pressure 149/89, pulse 67, temperature 97.1 F (36.2 C), temperature source Rectal, resp. rate 18, SpO2 100.00%.  General: pt slumped over in bed breathing loudly, nonverbal; does awaken to voice and follow some commands HEENT: pupils equal round and reactive to light, vision grossly intact, oropharynx clear and non-erythematous; dry mucous membranes; no visible thrush   Neck: supple Lungs:  lungs clear to ascultation bilaterally, though poor patient effort; there were some transmitted upper airway sounds, normal work of  respiration Heart: regular rate and rhythm, no murmurs, gallops, or rubs Abdomen: soft, non-tender, non-distended, normal bowel sounds Extremities: warm extremities bilaterally; skin w/ scattered contusions; 1+ pitting edema to BLE Neurologic: alert, but does not answer questions verbally; per family she talks only when she "feels like it" she does follow commands; strength at baseline per family, though difficult to assess 2/2 pt cooperation  Lab results: Basic Metabolic Panel:  Recent Labs  16/10/96 0500 02/15/13 1938  NA 151* 152*  K 3.1* 3.2*  CL 110 103  CO2 39*  --   GLUCOSE 116* 91  BUN  20 17  CREATININE 0.64 0.80  CALCIUM 7.7*  --    CBC:  Recent Labs  02/15/13 1846 02/15/13 1938  WBC 7.4  --   NEUTROABS 5.2  --   HGB 10.2* 10.9*  HCT 33.5* 32.0*  MCV 100.0  --   PLT 85*  --    BNP:  Recent Labs  02/15/13 1915  PROBNP 9121.0*   Imaging results:  Dg Chest Port 1 View  02/15/2013   CLINICAL DATA:  Shortness of breath.  EXAM: PORTABLE CHEST - 1 VIEW  COMPARISON:  Single view of the chest 02/12/2013.  FINDINGS: Bilateral pleural effusions and airspace disease, worse on the left, do not appear markedly changed. The cardiac silhouette is largely obscured. No pneumothorax is identified.  IMPRESSION: No change in right worse than left effusions and airspace disease likely due to edema.   Electronically Signed   By: Drusilla Kanner M.D.   On: 02/15/2013 19:22   Other results: EKG: NSR, LAD, normal intervals; no ischemic change or significant change from prior EKG.  Assessment & Plan by Problem:  # Dehydration and electrolyte disturbance: Patient has Na of 152, K=3.2 and BUN/Cre ratio>20 on admission. Since patient has severe dysphagia, she is getting fluid (D5-1/2NS) from PICC line at home, but the fluid rate is only 15 cc/hour (only about 360cc/day). Because of diastolic CHF, she has been getting Lasix 10 mg IV every other day per patient's daughter and caregiver.  Her dehydration has most likely been going on for more than 48 hours given her Na 151 on 12/19, and therefore the diagnosis would be chronic dehydration. Will need to correct slowly. The goal is to lower the serum sodium by a maximum of 10 meq/L in a 24-hour period (0.4 meq/L/hour) -Admit to tele bed -D5-1/2NS 75 cc/h -Replete K: 10 mEq X 2 IV -follow up BMP in AM  #Chronic Dysphagia- Pt w/ known history of dysphagia 2/2 CVA in 2009, however, significantly worse since 01/09/13. H/o aspiration pneumonia. Previously considered PEG but family declined and she received a PICC instead. Extensive discussion with family occurred last admission to help them understand the consequences of feeding in a patient who is a definite aspiration risk. During admission earlier this month, GI was consulted and had nothing further to offer. Previously considered PEG, but received PICC instead per family's request. Patient has gurgling sound with no aspiration on CXR. It is most likely due to dysphagia and difficulty in swallowing saliva.  -NPO -nasal/Tracheal suction  #COPD- Stable. -continue home xopenex neb  #Protein-energy malnutrition- Pt known to Dr. Dellie Catholic on comfort feeds despite chronic aspiration, though per family, pt not eating much of anything at home over the last 12 days.   -NPO  #Chronic systolic and diastolic heart failure -her proBNP is elevated at 9121 on admission. She also has bilateral leg edema on exam. However, she is clinically dry.  - will discontinued lasix - Gentle IVF as above, reassess volume status in AM  #Paroxysmal AF on Coumadin- not on coumadin. HR is well controlled at 67 on admission. Has pacemaker.   -monitor on tele bed -Correct electrolyte disturbance  #HTN- Controlled. -monitor VS   #: Candidiasis: Patient is on Diflucan 100 mg daily iv at home -will continue   #VTE px -Lovenox 30mg  sq    #FEN- D5 1/2NS @75ml /hr  Dispo: Disposition is deferred at this  time, awaiting improvement of current medical problems. Anticipated discharge in approximately 1 day(s).   The patient does not  have a current PCP (Provider Default, MD) and does need an [facility] hospital follow-up appointment after discharge.  The patient does not have transportation limitations that hinder transportation to clinic appointments.  Signed: Windell Hummingbird, MD 02/15/2013, 10:46 PM

## 2013-02-15 NOTE — ED Notes (Addendum)
Pt to ED via [facility] per family request.  Pt was released from [facility] Friday due to hypokalemia- pt has been receiving IV potassium at home with home health.  Family requested pt transfer.  Pt noted to have rhonchi in all lobes- edema noted to lower extremities.  Pt has PICC line.

## 2013-02-15 NOTE — ED Provider Notes (Signed)
CSN: [id number]     Arrival date & time 02/15/13  1815 History   First MD Initiated Contact with Patient 02/15/13 1818     Chief Complaint  Patient presents with  . Weakness    HPI  79 y/o female with history as noted below who presents with cc of weakness. The patient was seen here recently for hypokalemia. She was admitted and discharged home with a PICC line in place for IVF and replacement K+. Two days ago the patient received a dose of lasix. The patient's daughter states she began to hear a "gurgly" sound in her throat this morning and she has been weaker today that usual. She states it is a fine line between hypovolemia and fluid overload in the patient and she would like her e-lytes checked and to make sure she isn't volume overloaded. The patient denies complaints.   Past Medical History  Diagnosis Date  . Hypertension   . COPD (chronic obstructive pulmonary disease)   . Chronic combined systolic and diastolic CHF (congestive heart failure)   . Symptomatic bradycardia     a. s/p St. Jude pacemaker 2009.  Marland Kitchen HOCM (hypertrophic obstructive cardiomyopathy)     a. 2D Echo 10/2012: EF 40-45%, mild LVH, mild-mod TR, mitral SAM with mod MR, LVOT gradient not well assessed but at least 2.49m/s; findings c/w HOCM.  Marland Kitchen Mitral regurgitation     a. Mod MR by echo 10/2012.  . Tricuspid regurgitation     a. Mild-mod TR by echo 10/2012.  Marland Kitchen Dysphagia   . Pacemaker   . Heart murmur   . Paroxysmal atrial fibrillation   . LBBB (left bundle branch block)   . NSTEMI (non-ST elevated myocardial infarction) 10/2012    a. 10/2012 - managed medically.  Marland Kitchen DVT (deep venous thrombosis)   . PNA (pneumonia)     a. Per notes, h/o pulm injury r/t PNA.  aspiration PNA 11/2012  . Aspiration pneumonia   . Shortness of breath     "all the time" (02/12/2013)  . On home oxygen therapy     "2L; 24/7" (02/12/2013)  . Borderline diabetes   . Anxiety   . Squamous cell cancer of skin of right shoulder     "recently  dx'd" (02/12/2013)  . Stroke     a. L MCA CVA 2009.; "left her aphasic & w/dysphagia" (02/12/2013)   Past Surgical History  Procedure Laterality Date  . Femur surgery Right 06/2011    Rod inserted after a fall.   . Cholecystectomy    . Insert / replace / remove pacemaker  11/19/2007    St. Jude Zephyr 5020 pulse generator, serial [id number]   Family History  Problem Relation Age of Onset  . Heart disease Mother    History  Substance Use Topics  . Smoking status: Never Smoker   . Smokeless tobacco: Never Used  . Alcohol Use: No   OB History   Grav Para Term Preterm Abortions TAB SAB Ect Mult Living                 Review of Systems  Constitutional: Negative for fever and chills.  Respiratory: Positive for shortness of breath.   Cardiovascular: Negative for chest pain.  Gastrointestinal: Negative for nausea and vomiting.  All other systems reviewed and are negative.    Allergies  Codeine; Levaquin; and Sulfonamide derivatives  Home Medications   Current Outpatient Rx  Name  Route  Sig  Dispense  Refill  . Alum &  Mag Hydroxide-Simeth (MAGIC MOUTHWASH) SOLN   Oral   Take 5 mLs by mouth 4 (four) times daily.   600 mL   11   . Dextrose-Sodium Chloride (DEXTROSE 5 % AND 0.45% NACL) infusion   Intravenous   Inject 15 mL/hr into the vein continuous.         . fluconazole (DIFLUCAN) 100 MG tablet   Oral   Take 100 mg by mouth daily.         . furosemide (LASIX) 8 MG/ML solution   Intravenous   Inject 10 mg into the vein daily.          Marland Kitchen levalbuterol (XOPENEX) 0.63 MG/3ML nebulizer solution   Nebulization   Take 3 mLs (0.63 mg total) by nebulization every 6 (six) hours as needed for wheezing.   3 mL   30     Please give 1 month supply   . nitroGLYCERIN (NITROSTAT) 0.6 MG SL tablet   Sublingual   Place 0.6 mg under the tongue every 5 (five) minutes as needed for chest pain.          BP 124/67  Pulse 67  Temp(Src) 97.1 F (36.2 C) (Rectal)  Resp  17  SpO2 99% Physical Exam  Nursing note and vitals reviewed. Constitutional: She is oriented to person, place, and time. She appears cachectic.  Thin and ill appearing  HENT:  Head: Normocephalic and atraumatic.  Eyes: Conjunctivae are normal. Pupils are equal, round, and reactive to light.  Neck: Normal range of motion. Neck supple.  Cardiovascular: Normal rate and regular rhythm.  Exam reveals no gallop and no friction rub.   No murmur heard. Pulmonary/Chest: Effort normal and breath sounds normal.  Abdominal: Soft. She exhibits no distension. There is no tenderness.  Musculoskeletal: Normal range of motion. She exhibits no edema and no tenderness.  Kyphosis  Neurological: She is alert and oriented to person, place, and time. She has normal strength and normal reflexes. No cranial nerve deficit or sensory deficit.  Skin: Skin is warm and dry.  Psychiatric: She has a normal mood and affect.    ED Course  Procedures (including critical care time) Labs Review Labs Reviewed  CBC WITH DIFFERENTIAL - Abnormal; Notable for the following:    RBC 3.35 (*)    Hemoglobin 10.2 (*)    HCT 33.5 (*)    RDW 17.1 (*)    Platelets 85 (*)    All other components within normal limits  PRO B NATRIURETIC PEPTIDE - Abnormal; Notable for the following:    Pro B Natriuretic peptide (BNP) 9121.0 (*)    All other components within normal limits  POCT I-STAT, CHEM 8 - Abnormal; Notable for the following:    Sodium 152 (*)    Potassium 3.2 (*)    Hemoglobin 10.9 (*)    HCT 32.0 (*)    All other components within normal limits   Imaging Review Dg Chest Port 1 View  02/15/2013   CLINICAL DATA:  Shortness of breath.  EXAM: PORTABLE CHEST - 1 VIEW  COMPARISON:  Single view of the chest 02/12/2013.  FINDINGS: Bilateral pleural effusions and airspace disease, worse on the left, do not appear markedly changed. The cardiac silhouette is largely obscured. No pneumothorax is identified.  IMPRESSION: No  change in right worse than left effusions and airspace disease likely due to edema.   Electronically Signed   By: Drusilla Kanner M.D.   On: 02/15/2013 19:22    EKG Interpretation  None      MDM   1. Electrolyte abnormality   2. Hypokalemia   3. Failure to thrive in adult     Here with 1 day of weakness and increased fatigue. Afebrile with stable vital signs. CXR without frank pulmonary edema or infiltrate. Labs c/w hypernatremia likely secondary to dehydration. Persistent hypokalemia. The patient was admitted to internal medicine for IVF.     Shanon Ace, MD 02/15/13 [phone]

## 2013-02-16 ENCOUNTER — Encounter (HOSPITAL_COMMUNITY): Payer: Self-pay | Admitting: Emergency Medicine

## 2013-02-16 DIAGNOSIS — E871 Hypo-osmolality and hyponatremia: Secondary | ICD-10-CM

## 2013-02-16 HISTORY — DX: Hypo-osmolality and hyponatremia: E87.1

## 2013-02-16 LAB — BASIC METABOLIC PANEL
BUN: 17 mg/dL (ref 6–23)
CO2: 40 mEq/L (ref 19–32)
Calcium: 8 mg/dL — ABNORMAL LOW (ref 8.4–10.5)
GFR calc non Af Amer: 74 mL/min — ABNORMAL LOW (ref >90)
Sodium: 151 mEq/L — ABNORMAL HIGH (ref 135–145)

## 2013-02-16 LAB — POTASSIUM: Potassium: 3.6 mEq/L (ref 3.5–5.1)

## 2013-02-16 MED ORDER — DEXTROSE 5 % IV SOLN
INTRAVENOUS | Status: DC
  Administered 2013-02-16: 12:00:00 via INTRAVENOUS

## 2013-02-16 MED ORDER — SODIUM CHLORIDE 0.9 % IJ SOLN
10.0000 mL | INTRAMUSCULAR | Status: DC | PRN
  Administered 2013-02-17 – 2013-02-19 (×4): 10 mL

## 2013-02-16 MED ORDER — POTASSIUM CHLORIDE 2 MEQ/ML IV SOLN: Freq: Once | INTRAVENOUS | Status: DC

## 2013-02-16 MED ORDER — POTASSIUM CHLORIDE 10 MEQ/50ML IV SOLN
10.0000 meq | INTRAVENOUS | Status: AC
  Administered 2013-02-16 (×4): 10 meq via INTRAVENOUS
  Filled 2013-02-16 (×4): qty 50

## 2013-02-16 MED ORDER — POTASSIUM CHLORIDE 2 MEQ/ML IV SOLN: INTRAVENOUS | Status: DC

## 2013-02-16 MED ORDER — SCOPOLAMINE 1 MG/3DAYS TD PT72
1.0000 | MEDICATED_PATCH | TRANSDERMAL | Status: DC
  Administered 2013-02-16 – 2013-02-19 (×2): 1.5 mg via TRANSDERMAL
  Filled 2013-02-16 (×2): qty 1

## 2013-02-16 MED ORDER — PRO-STAT SUGAR FREE PO LIQD
30.0000 mL | Freq: Three times a day (TID) | ORAL | Status: DC
  Administered 2013-02-18: 30 mL via ORAL
  Filled 2013-02-16 (×11): qty 30

## 2013-02-16 MED ORDER — POTASSIUM CHLORIDE 10 MEQ/50ML IV SOLN
10.0000 meq | INTRAVENOUS | Status: AC
  Administered 2013-02-16 – 2013-02-17 (×6): 10 meq via INTRAVENOUS
  Filled 2013-02-16 (×6): qty 50

## 2013-02-16 MED ORDER — POTASSIUM CHLORIDE 10 MEQ/100ML IV SOLN
10.0000 meq | INTRAVENOUS | Status: AC
  Filled 2013-02-16 (×2): qty 100

## 2013-02-16 MED ORDER — POTASSIUM CHLORIDE 10 MEQ/50ML IV SOLN: 10.0000 meq | INTRAVENOUS | Status: DC

## 2013-02-16 MED ORDER — POTASSIUM CHLORIDE 10 MEQ/50ML IV SOLN
10.0000 meq | INTRAVENOUS | Status: DC
  Filled 2013-02-16 (×2): qty 50

## 2013-02-16 NOTE — Progress Notes (Signed)
UR completed 

## 2013-02-16 NOTE — Progress Notes (Signed)
Nutrition Brief Note  Patient screened for Malnutrition Screening Tool. High risk of dysphagia and aspiration. Family has declined PEG tube. On comfort feeds of Dysphagia 1 diet w/pudding thick liquids  Chart reviewed. Pt now transitioning to comfort care. Consult has been placed to palliative care.  No further nutrition interventions warranted at this time.  Please re-consult as needed.   Lloyd Huger MS RD LDN Clinical Dietitian Pager:[phone]

## 2013-02-16 NOTE — H&P (Signed)
  Date: 02/16/2013  Patient name: Dawn Barrett  Medical record number: [id number]  Date of birth: 10-07-1918   I have seen and evaluated Marcella Dubs and discussed their care with the Residency Team.  Ms. Rhem is a 79yo woman well known to the team.  She presents overnight for worsening breathing, increased gurgling and altered mental status with increased sleep.  She also seems to be having increased jerking/trembling of her upper torso and arms which last a few minutes.  She was recently given lasix for concern for CHF exacerbation given the breathing and gurgling.  She is currently on 15cc/hr of IVF at home with D5LR with KCl.  She has dysphagia and is an aspiration risk and has not taken much PO in the last 12 days.  She is followed at home by a house call physician.  She was noted to be sitting in bed, with labored breathing, having difficulty clearing secretions, she opens eyes to voice, dry MM, 1-2 + pitting edema to bilateral LE.  On labs, she does have a mildly elevated Na, mildly low K, normal renal function but slightly worse from last admission.  She has a normal WBC.  Pro BNP elevated to 9121.  She had a CXR which showed no change in her pleural effusions/pulmonary edema.   Assessment and Plan: I have seen and evaluated the patient as outlined above. I agree with the formulated Assessment and Plan as detailed in the residents' admission note, with the following changes:   This is a complicated case as the patient shows signs of both dehydration (free water deficit calculated at > 2L) and volume overload.  She has a low albumin due to not being able to eat/dysphagia.  She would not be interested in PEG tube per family.  She will be given about 1/2 of her free water deficit back via IVF and then fluids will be slowed down to 50cc/hr with KCL supplementation.  For upper airway issues and difficulty clearing secretions, we will start scopolamine.  We will need to consider starting  a low dose of lasix and balancing the effect with the appropriate level of fluids given her tenuous status.  The [facility] team is aware of the patient and have followed with [place] before, they have been consulted.  Per family, the patient wishes for continued therapy and they are wary of hospice care.  Per the family, on Friday and Saturday after being discharged she was speaking, singing and aware of her surroundings.  This is an acute change.    Inez Catalina, MD 12/22/20148:42 AM

## 2013-02-16 NOTE — Progress Notes (Signed)
CRITICAL VALUE ALERT  Critical value received:  CO2 40  Date of notification:  02/16/2013  Time of notification:  07:54  Critical value read back:yes  Nurse who received alert:  Doroteo Glassman, RN  MD notified (1st page):  Dr. Claudell Kyle  Time of first page:  07:55  MD notified (2nd page):  Time of second page:  Responding MD:  Dr. Claudell Kyle  Time MD responded:  07:56

## 2013-02-16 NOTE — Plan of Care (Signed)
Problem: Phase I Progression Outcomes Goal: OOB as tolerated unless otherwise ordered Outcome: Not Applicable Date Met:  02/16/13 Strict bed rest ordered

## 2013-02-17 DIAGNOSIS — Z515 Encounter for palliative care: Secondary | ICD-10-CM

## 2013-02-17 LAB — BASIC METABOLIC PANEL
BUN: 15 mg/dL (ref 6–23)
GFR calc Af Amer: 89 mL/min — ABNORMAL LOW (ref >90)
GFR calc non Af Amer: 77 mL/min — ABNORMAL LOW (ref >90)
Potassium: 4.6 mEq/L (ref 3.5–5.1)
Sodium: 154 mEq/L — ABNORMAL HIGH (ref 135–145)

## 2013-02-17 LAB — PROTIME-INR: INR: 4.65 — ABNORMAL HIGH (ref 0.00–1.49)

## 2013-02-17 MED ORDER — DEXTROSE-NACL 5-0.45 % IV SOLN
INTRAVENOUS | Status: AC
  Administered 2013-02-17 (×2): via INTRAVENOUS

## 2013-02-17 MED ORDER — VITAMIN K1 1 MG/0.5ML IJ SOLN
1.0000 mg | Freq: Once | INTRAMUSCULAR | Status: AC
  Administered 2013-02-17: 0.5 mg via SUBCUTANEOUS
  Filled 2013-02-17: qty 0.5

## 2013-02-17 MED ORDER — ACETAMINOPHEN 650 MG RE SUPP: 325.0000 mg | Freq: Four times a day (QID) | RECTAL | Status: DC | PRN

## 2013-02-17 MED ORDER — FUROSEMIDE 10 MG/ML IJ SOLN: 10.0000 mg | Freq: Once | INTRAMUSCULAR | Status: DC

## 2013-02-17 NOTE — Progress Notes (Addendum)
Palliative Medicine Team Progress Note  1. Dysphagia, terminal 2. CHF 3. Dehydration 4. Malnutrition  Filed Vitals:   02/17/13 1652  BP: 158/70  Pulse: 105  Temp: 98 F (36.7 C)  Resp: 17   Frail, edematous, immobile, poor dentition, lungs with upper airway congestion.   I met with Burnis Medin yesterday and have evaluated Mrs. Vantol today- she is actually much more alert today- she is able to endorse that she is tired- and squeezes my hand that she wants to be allowed to pass with comfort -she nods that she wants pain medication and medication for shortness of breath-this has not been desired by her daughter previously, even jr. Tylenol and they have not been open to [facility] daughter tells me she just wants to get through Christmas. My recommendation is to continue slow hydration with D5W, allow for comfort feeding -her potassium is normal today. I think she is ready to go home early tomorrow based on what her AM labs look like. I have discussed this with [place]. I expressed my concerns to Rod Holler that her mother may be suffering. I will ask [facility] to see her for follow through and ongoing goals. There were no care providers or family in the room during my evaluation.  25 minutes. Greater than 50%  of this time was spent counseling and coordinating care related to the above assessment and plan.  Anderson Malta, DO Palliative Medicine

## 2013-02-17 NOTE — Progress Notes (Signed)
Subjective: Ms. Hogge is doing about the same this morning; she was resting earlier this morning but was awake and denied pain on re-evaluation.  Her home nursing aides state that she was coughing some overnight which improved with suctioning.  She also urinated twice overnight.   Objective: Vital signs in last 24 hours: Filed Vitals:   02/17/13 0525 02/17/13 0935 02/17/13 1321 02/17/13 1652  BP: 145/72 170/80 140/90 158/70  Pulse: 67 71 71 105  Temp: 97.6 F (36.4 C)  98 F (36.7 C) 98 F (36.7 C)  TempSrc: Axillary  Axillary Axillary  Resp: 20 18 17 17   Weight:      SpO2: 93% 93% 97% 98%   Weight change: 0 oz (0.001 kg)  Intake/Output Summary (Last 24 hours) at 02/17/13 1717 Last data filed at 02/17/13 1321  Gross per 24 hour  Intake      0 ml  Output      0 ml  Net      0 ml   PEX General: resting in bed, NAD HEENT: NCAT, sclerae anicteric Neck: supple, no lymphadenopathy, JVD Lungs: clear to ascultation bilaterally though transmitted upper airway sounds, normal work of respiration, no wheezes, rales, ronchi Heart: regular rate and rhythm, no murmurs, gallops, or rubs Abdomen: soft, non-tender, non-distended, normal bowel sounds Extremities: 1-2+ pitting edema to bilateral lower extremities, no cyanosis, clubbing; scopolamine patch in place Neurologic: alert, difficult to assess 2/2 patient cooperation   Lab Results: Basic Metabolic Panel:  Recent Labs Lab 02/16/13 0650 02/16/13 1906 02/17/13 0500  NA 151*  --  154*  K 2.8* 3.6 4.6  CL 109  --  113*  CO2 40*  --  39*  GLUCOSE 144*  --  87  BUN 17  --  15  CREATININE 0.67  --  0.59  CALCIUM 8.0*  --  8.1*   Liver Function Tests:  Recent Labs Lab 02/12/13 1750  AST 43*  ALT 33  ALKPHOS 127*  BILITOT 0.7  PROT 6.7  ALBUMIN 1.9*   CBC:  Recent Labs Lab 02/12/13 1129 02/15/13 1846 02/15/13 1938  WBC 8.4 7.4  --   NEUTROABS  --  5.2  --   HGB 9.8* 10.2* 10.9*  HCT 32.0* 33.5* 32.0*  MCV  99.1 100.0  --   PLT 97* 85*  --    Cardiac Enzymes:  Recent Labs Lab 02/12/13 2307 02/13/13 0507 02/13/13 1051  TROPONINI <0.30 <0.30 <0.30   BNP:  Recent Labs Lab 02/12/13 1129 02/15/13 1915  PROBNP 8489.0* 9121.0*   CBG:  Recent Labs Lab 02/12/13 2046 02/13/13 0013 02/13/13 0543 02/13/13 0732 02/13/13 1128  GLUCAP 95 98 114* 108* 117*   Coagulation:  Recent Labs Lab 02/12/13 1231 02/17/13 0500  LABPROT 27.6* 42.0*  INR 2.68* 4.65*   Urinalysis:  Recent Labs Lab 02/12/13 2144  COLORURINE YELLOW  LABSPEC 1.013  PHURINE 6.0  GLUCOSEU 250*  HGBUR NEGATIVE  BILIRUBINUR NEGATIVE  KETONESUR NEGATIVE  PROTEINUR NEGATIVE  UROBILINOGEN 0.2  NITRITE NEGATIVE  LEUKOCYTESUR NEGATIVE   Studies/Results: Dg Chest Port 1 View  02/15/2013   CLINICAL DATA:  Shortness of breath.  EXAM: PORTABLE CHEST - 1 VIEW  COMPARISON:  Single view of the chest 02/12/2013.  FINDINGS: Bilateral pleural effusions and airspace disease, worse on the left, do not appear markedly changed. The cardiac silhouette is largely obscured. No pneumothorax is identified.  IMPRESSION: No change in right worse than left effusions and airspace disease likely due to edema.  Electronically Signed   By: Drusilla Kanner M.D.   On: 02/15/2013 19:22   Medications: I have reviewed the patient's current medications. Scheduled Meds: . feeding supplement (PRO-STAT SUGAR FREE 64)  30 mL Oral TID WC  . fluconazole (DIFLUCAN) IV  100 mg Intravenous Q24H  . scopolamine  1 patch Transdermal Q72H  . sodium chloride  3 mL Intravenous Q12H   Continuous Infusions: . dextrose 5 % and 0.45% NaCl 75 mL/hr at 02/17/13 1639   PRN Meds:.levalbuterol, nitroGLYCERIN, sodium chloride Assessment/Plan: #Dehydration with electrolyte disturbance- Patient had Na 152, K 3.2 and BUN/Cr ratio>20 on admission. Since patient has severe dysphagia, she is getting fluid (D5-1/2NS) from PICC line at home, but rate is only 15  cc/hour (only about 360cc/day). Because of diastolic CHF, she has been getting Lasix 10 mg IV every other day per patient's daughter-in-law. Her dehydration has most likely been going on for more than 48 hours given her Na 151 on 12/19, and therefore the diagnosis would be chronic dehydration.  Correcting slowly with goal to lower the serum sodium by a maximum of 10 meq/L in a 24-hour period. Today, patient remains dehydrated given Na 154 though K now 4.6.  Calculated free water deficit to be 2.5 L thus started D5 1/2NS at 100 cc/hr x 10 hours (to replace half deficit in this time period).  However, after speaking with patient's daughter-in-law, lowered to 75 cc/hr due to her preference.  Note: patient's daughter-in-law Avon Gully), Mrs. Burnis Medin, would like team to touch base with her each day. -D5-1/2NS 75 cc/h  -TED hose in place -follow up BMP in AM  -at this point, likely best home fluid regimen would be D5LR with K 10 meq supplementation at 50 cc/hr  #Chronic Dysphagia- Patient w/ known history of dysphagia 2/2 CVA in 2009 though significantly worse since 01/09/13. H/o aspiration pneumonia. Previously considered PEG but family declined, and she received a PICC instead. Extensive discussion with family occurred last admission to help them understand the consequences of feeding in a patient who is a definite aspiration risk. During admission earlier this month, GI was consulted and had nothing further to offer. Patient has gurgling sound with no aspiration on CXR. It is most likely due to dysphagia and difficulty in swallowing saliva.  -NPO, IVFs per above -nasal/tracheal suction  -scopolamine patch now in place per Dr. Phillips Odor  #Protein-energy malnutrition- Patient known to Dr. Phillips Odor- decided on comfort feeds despite chronic aspiration, though per family, patient not eating much of anything in last 2 weeks.  -continue NPO   #Paroxysmal AF NOT on Coumadin- HR is well controlled in low 70s with  pacemaker in place.  INR 4.65 today, likely due to lack of vitamin K from lack of PO intake.  Will supplement vitamin K with 1 mg IV, recheck INR in AM.  Pharmacy would also like to discontinue lovenox for DVT PPX given high INR.  Ordered SCDs.   #COPD- Stable.  -continue home xopenex neb   #Chronic systolic and diastolic heart failure- pBNP is elevated at 9121 on admission. She also has bilateral leg edema on exam. However, she is clinically dry with calculated free water deficit. -continue holding Lasix; will likely give 5-10 mg IV in AM   -gentle IVFs per above, reassess volume status in AM   #Candidiasis: Patient is on Diflucan 100 mg IV daily at home, continue while inpatient.  #HTN-  Controlled, continue to monitor  #DVT PPX- see above, pharmacy requested that we discontinue  lovenox today given INR 4.6; SCDs in place.  #FEN- D5 1/2NS @75ml /hr x 12 hours   Dispo: Disposition is deferred at this time, awaiting improvement of current medical problems.  Anticipated discharge in approximately 1-2 day(s).   The patient does have a current PCP (Provider Default, MD) and does need an [facility] hospital follow-up appointment after discharge.   .Services Needed at time of discharge: Y = Yes, Blank = No PT:   OT:   RN:   Equipment:   Other:     LOS: 2 days   Rocco Serene, MD 02/17/2013, 5:17 PM

## 2013-02-17 NOTE — ED Provider Notes (Signed)
I saw and evaluated the patient, reviewed the resident's note and I agree with the findings and plan.  EKG Interpretation    Date/Time:  Sunday February 15 2013 20:44:12 EST Ventricular Rate:  78 PR Interval:  132 QRS Duration: 88 QT Interval:  385 QTC Calculation: 438 R Axis:   -49 Text Interpretation:  Sinus rhythm Left anterior fascicular block Abnormal R-wave progression, late transition Confirmed by Azlee Monforte  MD, Buryl Bamber ([id number]) on 02/15/2013 10:52:06 PM             Jenell Dobransky T Doyle Tegethoff, MD 02/17/13 2004 

## 2013-02-17 NOTE — Discharge Summary (Signed)
I saw Dawn Barrett on day of discharge and assisted in the D/C planning.

## 2013-02-17 NOTE — Progress Notes (Signed)
  Date: 02/17/2013  Patient name: Dawn Barrett  Medical record number: [id number]  Date of birth: 05-12-1918   This patient has been seen and the plan of care was discussed with the house staff. Dr. Aundria Rud' note is pending at the time of my note.  Please see completed note for further details.   Dawn Barrett has decreased mentation today from the last time I saw her, but she does answer some yes and no questions.  Per labs, I think she is dehydrated given her low volume intake at home and recent lasix use.  The team calculated a free water deficit of > 2L and we are replacing this slowly given her h/o HF with EF of 40-45%.  She has continued LE edema with low protein as well, will place TED hose.  Family will be updated on course of care.   Inez Catalina, MD 02/17/2013, 4:42 PM

## 2013-02-18 DIAGNOSIS — E8809 Other disorders of plasma-protein metabolism, not elsewhere classified: Secondary | ICD-10-CM

## 2013-02-18 DIAGNOSIS — I509 Heart failure, unspecified: Secondary | ICD-10-CM

## 2013-02-18 DIAGNOSIS — E639 Nutritional deficiency, unspecified: Secondary | ICD-10-CM

## 2013-02-18 DIAGNOSIS — R1310 Dysphagia, unspecified: Secondary | ICD-10-CM

## 2013-02-18 LAB — BASIC METABOLIC PANEL
BUN: 14 mg/dL (ref 6–23)
Calcium: 8.3 mg/dL — ABNORMAL LOW (ref 8.4–10.5)
GFR calc non Af Amer: 78 mL/min — ABNORMAL LOW (ref >90)
Glucose, Bld: 118 mg/dL — ABNORMAL HIGH (ref 70–99)
Sodium: 151 mEq/L — ABNORMAL HIGH (ref 135–145)

## 2013-02-18 LAB — PROTIME-INR: Prothrombin Time: 23.5 seconds — ABNORMAL HIGH (ref 11.6–15.2)

## 2013-02-18 LAB — MAGNESIUM: Magnesium: 1.6 mg/dL (ref 1.5–2.5)

## 2013-02-18 MED ORDER — DEXTROSE 5 % IV SOLN
INTRAVENOUS | Status: AC
  Administered 2013-02-18: 19:00:00 via INTRAVENOUS

## 2013-02-18 NOTE — Progress Notes (Addendum)
Internal Medicine Attending  Date: 02/18/2013  Patient name: Dawn Barrett Medical record number: [id number] Date of birth: 1918/07/28 Age: 79 y.o. Gender: female  I saw and evaluated the patient, reviewed the chart, and discussed her care with house staff.  Patient is a 79 year old woman with complex medical history which includes congestive heart failure, dysphagia, poor nutritional status with hypoalbuminemia, and multiple other problems as outlined in prior notes.  She is currently awake but nonverbal, nods in response to her name, in no apparent distress.  Exam shows distant breath sounds in bases but clear upper lung fields; regular rhythm; soft nontender abdomen; 2+ bilateral lower extremity edema.  Labs today are notable for sodium 151, potassium 4.3, bicarbonate 39, BUN 14, creatinine 0.57, INR 2.17.    I called and discussed patient at length with her daughter Dawn Barrett this morning.  Management of patient's volume status is very difficult, given her congestive heart failure, limited oral intake, dysphagia with aspiration risk, and hypoalbuminemia with third spacing of fluid (bilateral pleural effusions and lower extremity edema).  Her hypernatremia and elevated bicarbonate are consistent with intravascular volume depletion due to dehydration; she received around half a liter of D51/2NS overnight, with slight improvement in her sodium since yesterday.  I explained above issues, and also explained that adjustments of IV fluid would need to be done on a daily basis based upon clinical status and monitoring of labs.  I discussed the advantages of outpatient involvement of palliative care.  Ms. Cronic does not want to stop IV hydration at this point, and understands the difficulty in managing this given the competing problems of malnutrition, third spacing of fluid with intravascular volume depletion, congestive heart failure, and inability to take substantial nutrition as well as ongoing  aspiration risk.  Based upon our discussion, the plan is to gently replace additional free water today with D5W at 50 mL per hour and frequent reassessment to avoid exacerbating congestive heart; if sodium is improved by tomorrow a.m. and patient is stable, then the plan will be to discharge home with plans for careful monitoring of IV fluid and labs and adjustment of IV rate by patient's outpatient physician.

## 2013-02-18 NOTE — Progress Notes (Signed)
Subjective: Dawn Barrett is doing the same this morning.    Objective: Vital signs in last 24 hours: Filed Vitals:   02/17/13 1321 02/17/13 1652 02/17/13 2102 02/18/13 0649  BP: 140/90 158/70 129/72 143/76  Pulse: 71 105 85 68  Temp: 98 F (36.7 C) 98 F (36.7 C) 98.1 F (36.7 C) 97.5 F (36.4 C)  TempSrc: Axillary Axillary Axillary Axillary  Resp: 17 17 17 18   Weight:   120 lb 9.5 oz (54.701 kg)   SpO2: 97% 98% 95% 96%   Weight change: 0 lb (0 kg)  Intake/Output Summary (Last 24 hours) at 02/18/13 0850 Last data filed at 02/18/13 0600  Gross per 24 hour  Intake    575 ml  Output      0 ml  Net    575 ml   PEX General: resting in bed, NAD HEENT: NCAT, sclerae anicteric Neck: supple, no lymphadenopathy, JVD Lungs: clear to ascultation bilaterally though transmitted upper airway sounds, normal work of respiration, no wheezes, rales, ronchi Heart: regular rate and rhythm, no murmurs, gallops, or rubs Abdomen: soft, non-tender, non-distended, normal bowel sounds Extremities: 1-2+ pitting edema to bilateral lower extremities, no cyanosis, clubbing; scopolamine patch in place Neurologic: alert, difficult to assess 2/2 patient cooperation   Lab Results: Basic Metabolic Panel:  Recent Labs Lab 02/17/13 0500 02/18/13 0500  NA 154* 151*  K 4.6 4.3  CL 113* 111  CO2 39* 39*  GLUCOSE 87 118*  BUN 15 14  CREATININE 0.59 0.57  CALCIUM 8.1* 8.3*  MG  --  1.6   Liver Function Tests:  Recent Labs Lab 02/12/13 1750  AST 43*  ALT 33  ALKPHOS 127*  BILITOT 0.7  PROT 6.7  ALBUMIN 1.9*   CBC:  Recent Labs Lab 02/12/13 1129 02/15/13 1846 02/15/13 1938  WBC 8.4 7.4  --   NEUTROABS  --  5.2  --   HGB 9.8* 10.2* 10.9*  HCT 32.0* 33.5* 32.0*  MCV 99.1 100.0  --   PLT 97* 85*  --    Cardiac Enzymes:  Recent Labs Lab 02/12/13 2307 02/13/13 0507 02/13/13 1051  TROPONINI <0.30 <0.30 <0.30   BNP:  Recent Labs Lab 02/12/13 1129 02/15/13 1915  PROBNP  8489.0* 9121.0*   CBG:  Recent Labs Lab 02/12/13 2046 02/13/13 0013 02/13/13 0543 02/13/13 0732 02/13/13 1128  GLUCAP 95 98 114* 108* 117*   Coagulation:  Recent Labs Lab 02/12/13 1231 02/17/13 0500 02/18/13 0500  LABPROT 27.6* 42.0* 23.5*  INR 2.68* 4.65* 2.17*   Urinalysis:  Recent Labs Lab 02/12/13 2144  COLORURINE YELLOW  LABSPEC 1.013  PHURINE 6.0  GLUCOSEU 250*  HGBUR NEGATIVE  BILIRUBINUR NEGATIVE  KETONESUR NEGATIVE  PROTEINUR NEGATIVE  UROBILINOGEN 0.2  NITRITE NEGATIVE  LEUKOCYTESUR NEGATIVE   Medications: I have reviewed the patient's current medications. Scheduled Meds: . feeding supplement (PRO-STAT SUGAR FREE 64)  30 mL Oral TID WC  . fluconazole (DIFLUCAN) IV  100 mg Intravenous Q24H  . scopolamine  1 patch Transdermal Q72H  . sodium chloride  3 mL Intravenous Q12H   PRN Meds:.acetaminophen, levalbuterol, nitroGLYCERIN, sodium chloride Assessment/Plan: #Dehydration with electrolyte disturbance- Patient had Na 152, K 3.2 and BUN/Cr ratio>20 on admission. Since patient has severe dysphagia, she is getting fluid (D5-1/2NS) from PICC line at home, but rate is only 15 cc/hour (only about 360cc/day). Because of diastolic CHF, she has been getting Lasix 10 mg IV every other day per patient's daughter-in-law. Her dehydration has most likely been  going on for more than 48 hours given her Na 151 on 12/19, and therefore the diagnosis would be chronic dehydration.   Today, patient remains dehydrated given Na 151 (elevated) though K now 4.3 (wnl).   -D5W @ 50 cc/h x 10 hours then will obtain repeat BMP and reassess fluid status before decided how to proceed with further IVFs -TED hose in place -BMP in AM; if Na improved and patient stable, will discharge home with plan for IVF monitoring and daily labs followed by outpatient physician    #Chronic Dysphagia- Patient w/ known history of dysphagia 2/2 CVA in 2009 though significantly worse since 01/09/13. H/o  aspiration pneumonia. Previously considered PEG but family declined, and she received a PICC instead. Extensive discussion with family occurred last admission to help them understand the consequences of feeding in a patient who is a definite aspiration risk. During admission earlier this month, GI was consulted and had nothing further to offer. Patient has gurgling sound with no aspiration on CXR. It is most likely due to dysphagia and difficulty in swallowing saliva.  -Dysphagia 1 diet, IVFs per above -nasal/tracheal suction  -scopolamine patch  #Protein-energy malnutrition- Patient known to Dr. Phillips Odor- decided on comfort feeds despite chronic aspiration, though per family, patient not eating much of anything in last 2 weeks.   #Paroxysmal AF NOT on Coumadin- HR is well controlled in low 70s with pacemaker in place. INR 4.65 yesterday, likely due to lack of vitamin K from lack of PO intake.  Gave vitamin K 1 mg IV yesterday, INR 2.17 today.  Pharmacy discontinued lovenox for DVT PPX yesterday given high INR; ordered SCDs.  -INR in AM   #COPD- Stable.  -continue home xopenex neb   #Chronic systolic and diastolic heart failure- pBNP is elevated at 9121 on admission. She also has bilateral leg edema on exam. However, she is clinically dry with calculated free water deficit. -continue holding Lasix -gentle IVFs per above  #Candidiasis: Patient is on Diflucan 100 mg IV daily at home, continue while inpatient.  #HTN-  Controlled, continue to monitor  #DVT PPX- SCDs per above  #FEN- D5W @ 50 ml/hr x 10 hours   Dispo: Disposition is deferred at this time, awaiting improvement of current medical problems.  Anticipated discharge in approximately 1-2 day(s).   The patient does have a current PCP (Provider Default, MD) and does need an [facility] hospital follow-up appointment after discharge.   .Services Needed at time of discharge: Y = Yes, Blank = No PT:   OT:   RN:   Equipment:   Other:      LOS: 3 days   Rocco Serene, MD 02/18/2013, 8:50 AM

## 2013-02-18 NOTE — Care Management Note (Signed)
   CARE MANAGEMENT NOTE 02/18/2013  Patient:  Dawn Barrett, Dawn Barrett   Account Number:  [id number]  Date Initiated:  02/18/2013  Documentation initiated by:  Darlyne Russian  Subjective/Objective Assessment:   admitted with increased work of breathing  lives at home with 24 hr caregivers  [facility] provides home health services/IVF     Action/Plan:   progression of care and discharge planning  02/18/13 Ongoing efforts to prepare for pt d/c. Per Dr Dorisann Frames this pt has D5W at home and should not require a change and may d/c on 02/19/2013.   Anticipated DC Date:  02/19/2013   Anticipated DC Plan:  HOME W HOME HEALTH SERVICES      DC Planning Services  CM consult      [facility] Choice  Resumption Of Svcs/PTA Provider   Choice offered to / List presented to:             Status of service:  In process, will continue to follow Medicare Important Message given?   (If response is "NO", the following Medicare IM given date fields will be blank) Date Medicare IM given:   Date Additional Medicare IM given:    Discharge Disposition:  HOME W HOME HEALTH SERVICES  Per UR Regulation:    If discussed at Long Length of Stay Meetings, dates discussed:    Comments:  02/19/2014 Multiple calls to [facility] re d/c needs and plan for d/c on 02/19/2013 with IV of D5W per Md pt should have this fluid at home. [facility] notifed of plan , no orders available at this time. Johny Shock RN MPH case manager, [phone]   02/18/2013  [address] [place], [place]  [phone]  Patient has [facility] services prior to admission. [facility]risten aware of inpatient status.

## 2013-02-18 NOTE — Progress Notes (Signed)
   CARE MANAGEMENT NOTE 02/18/2013  Patient:  Dawn Barrett, Dawn Barrett   Account Number:  [id number]  Date Initiated:  02/18/2013  Documentation initiated by:  Darlyne Russian  Subjective/Objective Assessment:   admitted with increased work of breathing  lives at home with 24 hr caregivers  [facility] provides home health services/IVF     Action/Plan:   progression of care and discharge planning   Anticipated DC Date:  02/19/2013   Anticipated DC Plan:  HOME W HOME HEALTH SERVICES      DC Planning Services  CM consult      [facility] Choice  Resumption Of Svcs/PTA Provider   Choice offered to / List presented to:             Status of service:  In process, will continue to follow Medicare Important Message given?   (If response is "NO", the following Medicare IM given date fields will be blank) Date Medicare IM given:   Date Additional Medicare IM given:    Discharge Disposition:  HOME W HOME HEALTH SERVICES  Per UR Regulation:    If discussed at Long Length of Stay Meetings, dates discussed:    Comments:  02/18/2013  [address] [place], [place]  [phone]  Patient has [facility] services prior to admission. [facility] aware of inpatient status.

## 2013-02-19 LAB — BASIC METABOLIC PANEL
BUN: 15 mg/dL (ref 6–23)
BUN: 14 mg/dL (ref 6–23)
Calcium: 8.1 mg/dL — ABNORMAL LOW (ref 8.4–10.5)
Chloride: 109 mEq/L (ref 96–112)
Creatinine, Ser: 0.6 mg/dL (ref 0.50–1.10)
GFR calc Af Amer: 89 mL/min — ABNORMAL LOW (ref >90)
GFR calc Af Amer: 89 mL/min — ABNORMAL LOW (ref >90)
GFR calc non Af Amer: 77 mL/min — ABNORMAL LOW (ref >90)
Glucose, Bld: 123 mg/dL — ABNORMAL HIGH (ref 70–99)
Glucose, Bld: 123 mg/dL — ABNORMAL HIGH (ref 70–99)
Potassium: 4.2 mEq/L (ref 3.5–5.1)
Potassium: 4.1 mEq/L (ref 3.5–5.1)
Sodium: 149 mEq/L — ABNORMAL HIGH (ref 135–145)
Sodium: 148 mEq/L — ABNORMAL HIGH (ref 135–145)

## 2013-02-19 LAB — PROTIME-INR
INR: 1.4 (ref 0.00–1.49)
Prothrombin Time: 16.8 seconds — ABNORMAL HIGH (ref 11.6–15.2)

## 2013-02-19 MED ORDER — DEXTROSE 5 % IV SOLN
INTRAVENOUS | Status: AC
  Administered 2013-02-19: 06:00:00 via INTRAVENOUS

## 2013-02-19 MED ORDER — DEXTROSE 5 % IV BOLUS
1000.0000 mL | Freq: Once | INTRAVENOUS | Status: DC
Start: 2013-02-19 — End: 2013-02-19

## 2013-02-19 MED ORDER — HEPARIN SOD (PORK) LOCK FLUSH 100 UNIT/ML IV SOLN
250.0000 [IU] | INTRAVENOUS | Status: AC | PRN
  Administered 2013-02-19: 250 [IU]

## 2013-02-19 MED ORDER — FUROSEMIDE 10 MG/ML IJ SOLN
5.0000 mg | Freq: Once | INTRAMUSCULAR | Status: AC
  Administered 2013-02-19: 5 mg via INTRAVENOUS
  Filled 2013-02-19: qty 0.5

## 2013-02-19 MED ORDER — DEXTROSE-NACL 5-0.45 % IV SOLN: INTRAVENOUS | Status: DC

## 2013-02-19 MED ORDER — SCOPOLAMINE 1 MG/3DAYS TD PT72: 1.0000 | MEDICATED_PATCH | TRANSDERMAL | Status: AC

## 2013-02-19 NOTE — Discharge Summary (Signed)
Name: Dawn Barrett MRN: [id number] DOB: March 02, 1918 79 y.o. PCP: Provider Default, MD  Date of Admission: 02/15/2013  6:15 PM Date of Discharge: 02/19/2013 Attending Physician: Dr. Meredith Pel  Discharge Diagnosis: Principal Problem:   Electrolyte abnormality Active Problems:   Paroxysmal atrial fibrillation   Dehydration   Protein-calorie malnutrition, severe   Dysphagia   Failure to thrive in adult  Discharge Medications:   Medication List    STOP taking these medications       dextrose 5 % and 0.45% NaCl infusion     furosemide 8 MG/ML solution  Commonly known as:  LASIX      TAKE these medications       fluconazole 100 MG tablet  Commonly known as:  DIFLUCAN  Take 100 mg by mouth daily.     levalbuterol 0.63 MG/3ML nebulizer solution  Commonly known as:  XOPENEX  Take 3 mLs (0.63 mg total) by nebulization every 6 (six) hours as needed for wheezing.     magic mouthwash Soln  Take 5 mLs by mouth 4 (four) times daily.     nitroGLYCERIN 0.6 MG SL tablet  Commonly known as:  NITROSTAT  Place 0.6 mg under the tongue every 5 (five) minutes as needed for chest pain.     scopolamine 1.5 MG  Commonly known as:  TRANSDERM-SCOP  Place 1 patch (1.5 mg total) onto the skin every 3 (three) days.        Disposition and follow-up:   Dawn Barrett was discharged from [facility] in Stable condition.  At the hospital follow up visit please address:  1.  Dehydration and IVF regimen   2.  Labs / imaging needed at time of follow-up: BMP  3.  Pending labs/ test needing follow-up: none  Follow-up Appointments: Follow-up Information   Follow up with [facility]. [facility] RN and aide)    Specialty:  Home Health Services   Contact information:   [address] [place] [address] [phone]     follow-up with PCP concierge physician day after discharge  Discharge Instructions: Discharge Orders   Future Orders  Complete By Expires   Call MD for:  difficulty breathing, headache or visual disturbances  As directed    Care order/instruction  As directed    Comments:     Discharge home with PICC in place.   Diet - low sodium heart healthy  As directed    Increase activity slowly  As directed       Consultations: Treatment Team:  Palliative [facility]- Dr. Phillips Odor  Procedures Performed:  Dg Chest 2 View  01/28/2013   CLINICAL DATA:  Respiratory distress.  EXAM: CHEST  2 VIEW  COMPARISON:  January 11, 2013.  FINDINGS: Stable cardiomegaly. Bilateral pleural effusions are again noted. Left-sided pacemaker is unchanged in position. Severe thoracic kyphosis is noted with stable compression deformity of mid thoracic vertebral body. Bilateral basilar opacities are noted concerning for atelectasis or other consolidation. No pneumothorax is noted.  IMPRESSION: Stable cardiomegaly and bilateral pleural effusions, with associated bilateral basilar atelectasis or consolidation.   Electronically Signed   By: Roque Lias M.D.   On: 01/28/2013 13:45   Dg Chest Port 1 View  02/15/2013   CLINICAL DATA:  Shortness of breath.  EXAM: PORTABLE CHEST - 1 VIEW  COMPARISON:  Single view of the chest 02/12/2013.  FINDINGS: Bilateral pleural effusions and airspace disease, worse on the left, do not appear markedly changed. The cardiac silhouette  is largely obscured. No pneumothorax is identified.  IMPRESSION: No change in right worse than left effusions and airspace disease likely due to edema.   Electronically Signed   By: Drusilla Kanner M.D.   On: 02/15/2013 19:22   Dg Chest Port 1 View  02/12/2013   CLINICAL DATA:  Chest pain.  Short of breath.  EXAM: PORTABLE CHEST - 1 VIEW  COMPARISON:  01/30/2013.  FINDINGS: Cardiopericardial silhouette is partially obscured but appears enlarged. Tortuous atherosclerotic thoracic aorta. Old right rib fractures. Two lead left subclavian pacemaker. Bilateral pleural effusions are present.  Right pleural effusion is moderate and left pleural fusion is mild. Dependent atelectasis is present both lung bases. There is pulmonary vascular congestion and interstitial and alveolar pulmonary edema compatible with moderate CHF.  IMPRESSION: Constellation of findings compatible with moderate CHF. Moderate right and small left pleural effusion.   Electronically Signed   By: Andreas Newport M.D.   On: 02/12/2013 12:34   Dg Chest Port 1 View  01/30/2013   CLINICAL DATA:  Short of breath  EXAM: PORTABLE CHEST - 1 VIEW  COMPARISON:  01/28/2013  FINDINGS: Bilateral effusions and bibasilar atelectasis unchanged. Chronic lung disease is noted.  Right upper lobe airspace disease appears more prominent, possible pneumonia  IMPRESSION: Possible right upper lobe pneumonia or edema.  No change bibasilar atelectasis and effusion.   Electronically Signed   By: Marlan Palau M.D.   On: 01/30/2013 13:08   Admission HPI:  Pt is a 79 y.o. female who lives with her daughter and is completely bedbound. She has a PMH significant for CVA (L. MCA, 2009), chronic dysphagia and aspiration (2/2 CVA), COPD (on 2.5LPM O2), HTN, systolic and diastolic CHF (EF 54-09% in 9/14) and HOCM, paroxysmal A-fib w/ pacemaker. She is accompanied by a home health aide and her daughter who brought patient in today because she was not acting her usual self and they think she is dehydrated. They report that over the last two days patient has been breathing louder and making "gurgling" noises and she has been acting more "out of it" than usual. She has not been sleeping more than usual however. They are also concerned about patient having episodes of head and upper body "twitching" that last a few minutes and occur about 40 times per hour, there is no jerking to her extremities or tongue biting with these episodes. Patient has a PICC line and has been receiving D5LR w/ KCl at 15cc/hr continuously at home. She has been taking lasix 10mg  IV prn and has  been doing this approximately every other day, last dose was yesterday. Patient has severe dysphagia that is a chronic issue and has not been eating much for the past 12 days. Patient has been admitted about 6 times for similar issues in the last 6 months. PEG placement has been offered, but family has refused and instead opted for PICC line combined w/ comfort feeds. GI was consulted during an admission a few weeks ago for oral candidiasis and patient continues treatment now w/ Diflucan IV through a PICC line. She is followed by physicians who make house calls.   Hospital Course by problem list: 1. Chronic dehydration with electrolyte disturbance- On admission, patient had Na 152, K 3.2 and BUN/Cr ratio>20.  Since patient has severe dysphagia, she was receiving IVFs (D5-1/2NS) from PICC line at home, but rate was only 15 cc/hour (only about 360cc/day). Because of diastolic CHF, she had also been getting Lasix 10 mg IV every  other day at home prior to admission.   Clinical picture is complicated because patient shows signs of both dehydration and volume overload on exam.  Patient's free water deficit was calculated to be 2.5L.  However, hydration is a challenge given her hypoalbuminemia and resultant third spacing of fluids.  She is also unable to eat due to dysphagia so her albumin will remain chronically low.  Furthermore, her CHF is very sensitive even to relatively low IVF rates of 75 cc/hr per Dr. Phillips Odor who followed patient during her last admission.   Patient was hypernatremic to 150s throughout admission but this trended down to Na 148 on day of discharge.  She had been receiving D5W at 50 cc/hr for the 24 hours prior to discharge without respiratory problems.  Therefore, the following recommendations were made at discharge: D5W (bag dispensed at discharge) at 30 cc/hr through 12/26 morning when patient should have repeat BMP.  If sodium continues to drop, she should be switched to D51/4NS (bag  dispensed at discharge) at rate of 30 cc/hr.  Fluids should run at rates between 20-50 cc/hr depending on patient's clinical status (to be evaluated by her PCP).  Patient will need day to day adjustments in her fluids and supplementation based clinical picture and electrolytes.  If she develops respiratory distress, fluids should be discontinued.  Potassium should be supplemented as needed based on BMP results.  She will also take Lasix as needed.  Note: per case management, home health RN will be going to patient's home 12/25 PM; per patient's daughter, PCP can evaluate her on a daily basis with labs, etc. For upper airway issues with difficulty clearing secretions, palliative care recommended scopolamine patch which seemed to help thus she was discharged with rx.  TED hose were also placed on patient's leg with recommendation for continuing at discharge.   2. Chronic dysphagia- Patient has known history of dysphagia due to CVA in 2009 though significantly worse since 12/2012.  She has a history of aspiration pneumonia. Previously considered PEG but family declined, and she received a PICC for IVFs instead. Extensive discussion with family occurred last admission to help them understand the consequences of feeding in a patient who is a definite aspiration risk.  Gave IVFs per above with prn nasal/tracheal suction and scopolamine patches per above. Patient is also on Diflucan 100 mg IV daily at home due to concern for thrush, continued while inpatient.   3. Protein-energy malnutrition/FTT- Patient known to Dr. Phillips Odor, family decided on comfort feeds despite chronic aspiration though per family patient had not eaten much in 2 weeks prior to admission.  She ate little if at all while inpatient.     4. Paroxysmal AF NOT on Coumadin- Patient has reportedly not been on Coumadin since early December, 2014.  HR was well controlled (70s-80s) while inpatient with pacemaker in place.  INR 4.65 on 12/23, likely due to  lack of vitamin K from lack of PO intake. Gave vitamin K 1 mg IV, INR 2.17 on 12/24, 1.4 on day of discharge. Pharmacy discontinued lovenox for DVT PPX given high INR on 12/23; SCDs in place.   5. Chronic systolic and diastolic heart failure- pBNP elevated at 9121 on admission. Patient has 2+ pitting bilateral leg edema on exam. However, she is clinically dry with calculated free water deficit (see discussion above). Held Lasix while inpatient and gave gentle IVFs per above with careful monitoring of clinical status.   6. COPD- Stable. Continued home xopenex nebulizers while inpatient  and at discharge.      Discharge Vitals:   BP 130/70  Pulse 66  Temp(Src) 97.5 F (36.4 C) (Axillary)  Resp 18  Ht 5\' 5"  (1.651 m)  Wt 120 lb 9.5 oz (54.701 kg)  BMI 20.07 kg/m2  SpO2 94%  Discharge Labs:  No results found for this or any previous visit (from the past 24 hour(s)).  Signed: Rocco Serene, MD 02/21/2013, 11:37 AM   Time Spent on Discharge: 40 minutes Services Ordered on Discharge: none Equipment Ordered on Discharge: none

## 2013-02-19 NOTE — Progress Notes (Addendum)
   CARE MANAGEMENT NOTE 02/19/2013  Patient:  Dawn Barrett, Dawn Barrett   Account Number:  [id number]  Date Initiated:  02/18/2013  Documentation initiated by:  Dawn Barrett  Subjective/Objective Assessment:   admitted with increased work of breathing  lives at home with 24 hr caregivers  [facility] provides home health services/IVF     Action/Plan:   progression of care and discharge planning  02/18/13 Ongoing efforts to prepare for pt d/c. Per Dr Dawn Barrett this pt has D5W at home and should not require a change and may d/c on 02/19/2013.   Anticipated DC Date:  02/19/2013   Anticipated DC Plan:  HOME W HOME HEALTH SERVICES      DC Planning Services  CM consult      [facility] Choice  Resumption Of Svcs/PTA Provider   Choice offered to / List presented to:          [facility] arranged  HH-1 RN      [facility] agency  [facility]   Status of service:  Completed, signed off Medicare Important Message given?   (If response is "NO", the following Medicare IM given date fields will be blank) Date Medicare IM given:   Date Additional Medicare IM given:    Discharge Disposition:  HOME W HOME HEALTH SERVICES  Per UR Regulation:    If discussed at Long Length of Stay Meetings, dates discussed:    Comments:  02/19/2013 1345 NCM spoke to [facility] rep and will have HH RN come out to follow up on IV fluids. Pt will dc with a bag of IV fluids to take home administed by pharmacy. Dawn Donning RN CCM Case Mgmt phone [phone]  02/18/2014 Multiple calls to [facility] re d/c needs and plan for d/c on 02/19/2013 with IV of D5W per Md pt should have this fluid at home. [facility] notifed of plan , no orders available at this time. Dawn Shock RN MPH case manager, [phone]   02/18/2013  [address] [place], [place]  [phone]  Patient has [facility] services prior to admission. [facility]/Dawn Barrett aware of inpatient status.

## 2013-02-19 NOTE — Progress Notes (Signed)
Subjective: Dawn Barrett is doing the same this morning.  Her daughter, Burna Atlas, at bedside.  We discussed the plan in detail, and she is comfortable with her mother being discharged home today.   Objective: Vital signs in last 24 hours: Filed Vitals:   02/18/13 1615 02/18/13 2029 02/18/13 2308 02/19/13 0500  BP: 155/72 158/70 189/67 155/71  Pulse: 75 73  69  Temp:  97.7 F (36.5 C)  97.8 F (36.6 C)  TempSrc:  Axillary  Axillary  Resp: 18 18  18   Weight:  120 lb 9.5 oz (54.701 kg)    SpO2: 97% 93%  96%   Weight change: 0 lb (0 kg)  Intake/Output Summary (Last 24 hours) at 02/19/13 0743 Last data filed at 02/19/13 0555  Gross per 24 hour  Intake      0 ml  Output      0 ml  Net      0 ml   PEX General: resting in bed, NAD HEENT: NCAT, sclerae anicteric Neck: supple, no lymphadenopathy, JVD Lungs: clear to ascultation bilaterally though transmitted upper airway sounds, normal work of respiration, no wheezes, rales, ronchi Heart: regular rate and rhythm, no murmurs, gallops, or rubs Abdomen: soft, non-tender, non-distended, normal bowel sounds Extremities: 1-2+ pitting edema to bilateral lower extremities, no cyanosis, clubbing; scopolamine patch in place Neurologic: somewhat alert, difficult to assess 2/2 patient cooperation   Lab Results: Basic Metabolic Panel:  Recent Labs Lab 02/18/13 0500 02/19/13 0020 02/19/13 0520  NA 151* 149* 148*  K 4.3 4.2 4.1  CL 111 108 109  CO2 39* 38* 37*  GLUCOSE 118* 123* 123*  BUN 14 15 14   CREATININE 0.57 0.60 0.60  CALCIUM 8.3* 8.1* 8.1*  MG 1.6  --   --    Liver Function Tests:  Recent Labs Lab 02/12/13 1750  AST 43*  ALT 33  ALKPHOS 127*  BILITOT 0.7  PROT 6.7  ALBUMIN 1.9*   CBC:  Recent Labs Lab 02/12/13 1129 02/15/13 1846 02/15/13 1938  WBC 8.4 7.4  --   NEUTROABS  --  5.2  --   HGB 9.8* 10.2* 10.9*  HCT 32.0* 33.5* 32.0*  MCV 99.1 100.0  --   PLT 97* 85*  --    Cardiac  Enzymes:  Recent Labs Lab 02/12/13 2307 02/13/13 0507 02/13/13 1051  TROPONINI <0.30 <0.30 <0.30   BNP:  Recent Labs Lab 02/12/13 1129 02/15/13 1915  PROBNP 8489.0* 9121.0*   CBG:  Recent Labs Lab 02/12/13 2046 02/13/13 0013 02/13/13 0543 02/13/13 0732 02/13/13 1128  GLUCAP 95 98 114* 108* 117*   Coagulation:  Recent Labs Lab 02/12/13 1231 02/17/13 0500 02/18/13 0500 02/19/13 0520  LABPROT 27.6* 42.0* 23.5* 16.8*  INR 2.68* 4.65* 2.17* 1.40   Urinalysis:  Recent Labs Lab 02/12/13 2144  COLORURINE YELLOW  LABSPEC 1.013  PHURINE 6.0  GLUCOSEU 250*  HGBUR NEGATIVE  BILIRUBINUR NEGATIVE  KETONESUR NEGATIVE  PROTEINUR NEGATIVE  UROBILINOGEN 0.2  NITRITE NEGATIVE  LEUKOCYTESUR NEGATIVE   Medications: I have reviewed the patient's current medications. Scheduled Meds: . feeding supplement (PRO-STAT SUGAR FREE 64)  30 mL Oral TID WC  . fluconazole (DIFLUCAN) IV  100 mg Intravenous Q24H  . scopolamine  1 patch Transdermal Q72H  . sodium chloride  3 mL Intravenous Q12H   PRN Meds:.acetaminophen, levalbuterol, nitroGLYCERIN, sodium chloride Assessment/Plan: #Dehydration with electrolyte disturbance- Patient had Na 152, K 3.2 and BUN/Cr ratio>20 on admission. Since patient has severe dysphagia, she is getting  fluid (D5-1/2NS) from PICC line at home, but rate is only 15 cc/hour (only about 360cc/day). Because of diastolic CHF, she has been getting Lasix 10 mg IV every other day per patient's daughter. Her dehydration has most likely been going on for more than 48 hours given her Na 151 on 12/19, and therefore the diagnosis would be chronic dehydration.   Today, patient remains dehydrated but improved with  Na 148 (elevated) though K now 4.1 (wnl).   -D5W @ 50 cc/hr until discharge, change to rate of 30 cc/hr upon arrival home; see discharge instructions for detailed fluid plan -TED hose in place -BMP in AM; if Na improved and patient stable, will switch to  D51/4NS at 30 cc/hr; patient will need to be re-evaluated on daily basis for adjustment of fluids and electrolyte supplementation as needed  #Chronic Dysphagia- Patient w/ known history of dysphagia 2/2 CVA in 2009 though significantly worse since 01/09/13. H/o aspiration pneumonia. Previously considered PEG but family declined, and she received a PICC instead. Extensive discussion with family occurred last admission to help them understand the consequences of feeding in a patient who is a definite aspiration risk. During admission earlier this month, GI was consulted and had nothing further to offer. Patient has gurgling sound with no aspiration on CXR. It is most likely due to dysphagia and difficulty in swallowing saliva.  -Dysphagia 1 diet, IVFs per above -nasal/tracheal suction  -scopolamine patch  #Protein-energy malnutrition- Patient known to Dr. Phillips Odor- decided on comfort feeds despite chronic aspiration, though per family, patient not eating much of anything in last 2 weeks.   #Paroxysmal AF NOT on Coumadin- HR is well controlled in low 70s with pacemaker in place. INR 4.65 on 12/23, likely due to lack of vitamin K from lack of PO intake.  Gave vitamin K 1 mg IV, INR 2.17 yesterday --> 1.4 today.  Pharmacy discontinued lovenox for DVT PPX yesterday given high INR; SCDs in place.   #COPD- Stable.  -continue home xopenex neb   #Chronic systolic and diastolic heart failure- pBNP is elevated at 9121 on admission. She also has bilateral leg edema on exam. However, she is clinically dry with calculated free water deficit. -continue holding Lasix -gentle IVFs per above  #Candidiasis: Patient is on Diflucan 100 mg IV daily at home, continue while inpatient.  #HTN-  Controlled, continue to monitor  #DVT PPX- SCDs per above  #FEN- D5W @ 50 cc/hr until discharge, change to 30 cc/hr on arrival home   Dispo:  Anticipated discharge today.   The patient does have a current PCP (Provider  Default, MD) and does need an [facility] hospital follow-up appointment after discharge.   .Services Needed at time of discharge: Y = Yes, Blank = No PT:   OT:   RN:   Equipment:   Other:     LOS: 4 days   Rocco Serene, MD 02/19/2013, 7:43 AM

## 2013-02-19 NOTE — Progress Notes (Signed)
Bladder scan     , no abd distention noted

## 2013-02-19 NOTE — Progress Notes (Signed)
Plan is for pt to be discharged home today with IVF infusing via PICC line. Dr. Rocco Serene wants pt to be D/C'd home with D5W @ 30 mls/hr infusing. Pt to be transported home via EMS in order to accommodate this. Inpatient orders were placed for D5W and D5-1/2NS infusions/boluses as recommended to the MD by pharmacist, even though the orders are actually for discharge. MD to follow up with case management for home health services.  Dawn Barrett, Dawn Barrett

## 2013-02-19 NOTE — Progress Notes (Signed)
Spoke to CM who stated pt was unable to be discharged home with a bag of IVF from the hospital. Stated she will address it with the MD and get discharge orders clarified. Will monitor.  Kathlene November, Josephus Harriger [place]

## 2013-02-19 NOTE — Progress Notes (Signed)
CM addressed IV fluid medication issues with MD. Will be discharged home with home health services through [facility].   Kathlene November, Dawn Barrett [place]

## 2013-02-19 NOTE — Progress Notes (Signed)
Internal Medicine Attending  Date: 02/19/2013  Patient name: Dawn Barrett Medical record number: [id number] Date of birth: [birth year] Age: 79 y.o. Gender: female  I saw and evaluated the patient on a.m. rounds, and discussed her care with house staff. I reviewed the resident's note by Dr. Aundria Rud and I agree with the resident's findings and plans as documented in her note.

## 2013-02-20 NOTE — Progress Notes (Signed)
   CARE MANAGEMENT NOTE 02/20/2013  Patient:  Dawn Barrett, Dawn Barrett   Account Number:  [id number]  Date Initiated:  02/18/2013  Documentation initiated by:  Darlyne Russian  Subjective/Objective Assessment:   admitted with increased work of breathing  lives at home with 24 hr caregivers  [facility] provides home health services/IVF     Action/Plan:   progression of care and discharge planning  02/18/13 Ongoing efforts to prepare for pt d/c. Per Dr Dorisann Frames this pt has D5W at home and should not require a change and may d/c on 02/19/2013.   Anticipated DC Date:  02/19/2013   Anticipated DC Plan:  HOME W HOME HEALTH SERVICES      DC Planning Services  CM consult      [facility] Choice  Resumption Of Svcs/PTA Provider   Choice offered to / List presented to:          [facility] arranged  HH-1 RN      [facility] agency  [facility]   Status of service:  Completed, signed off Medicare Important Message given?   (If response is "NO", the following Medicare IM given date fields will be blank) Date Medicare IM given:   Date Additional Medicare IM given:    Discharge Disposition:  HOME W HOME HEALTH SERVICES  Per UR Regulation:    If discussed at Long Length of Stay Meetings, dates discussed:    Comments:  02/19/2013 1345 NCM spoke to [facility] rep and will have HH RN come out to follow up on IV fluids. Pt will dc with a bag of IV fluids to take home administed by pharmacy. Isidoro Donning RN CCM Case Mgmt phone [phone]  02/18/2014 Multiple calls to [facility] re d/c needs and plan for d/c on 02/19/2013 with IV of D5W per Md pt should have this fluid at home. [facility] notifed of plan , no orders available at this time. Johny Shock RN MPH case manager, [phone]   02/18/2013  [address] [place], [place]  [phone]  Patient has [facility] services prior to admission. [facility]/Kristen aware of inpatient status.

## 2013-03-17 ENCOUNTER — Telehealth: Payer: Self-pay

## 2013-03-17 NOTE — Telephone Encounter (Signed)
Patient past away per [facility] in [employer] °

## 2013-03-29 DEATH — deceased

## 2013-10-30 IMAGING — CT CT HEAD W/O CM
1 of 5 series · 15 of 30 positions shown, 19 images · non-contrast
Comparison: CT of head October 27, 2007

CLINICAL DATA: Altered mental status.

CT HEAD WITHOUT CONTRAST
TECHNIQUE: Contiguous axial images were obtained from the base of
the skull through the vertex without contrast.

[head w/o · axial · non-contrast · 0.44mm/px · z∈[+78,+230]mm · 15 of 341 slices shown, 19 images]
[im 18/341  brain]
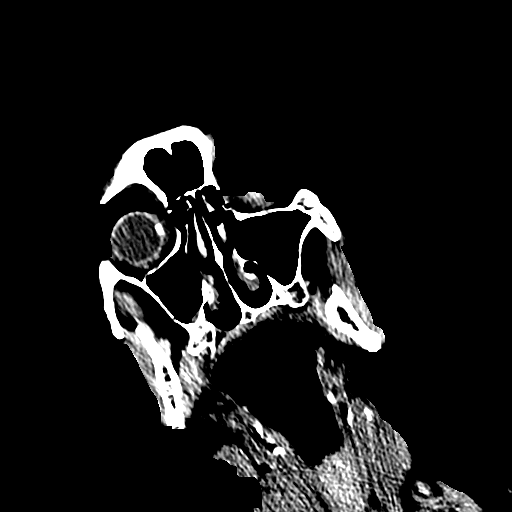
[im 18/341  bone]
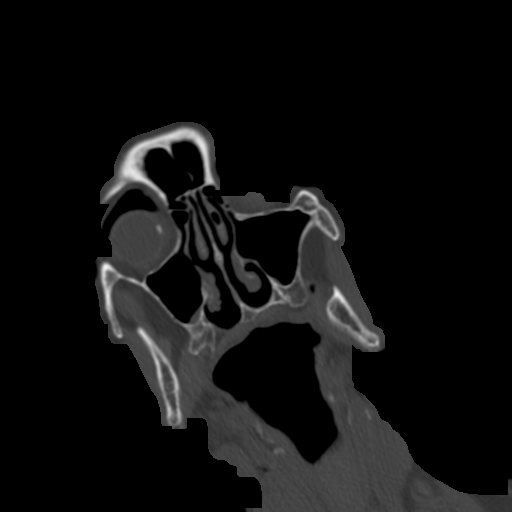
[im 36/341  brain]
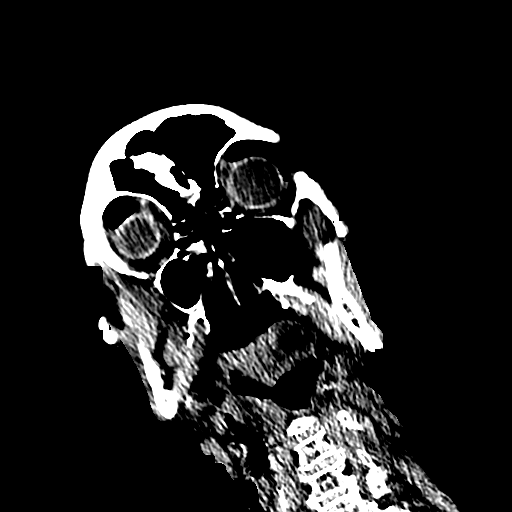
[im 72/341  brain]
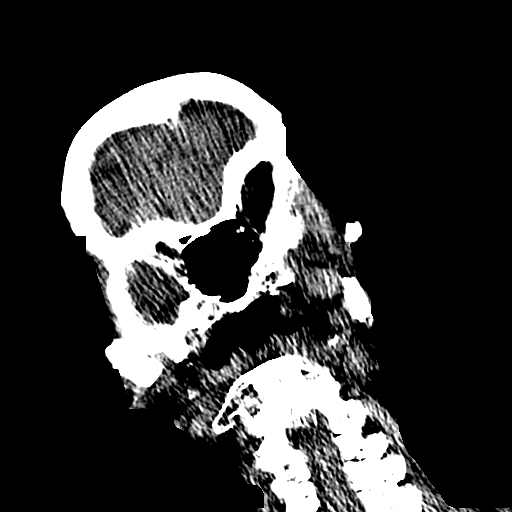
[im 90/341  brain]
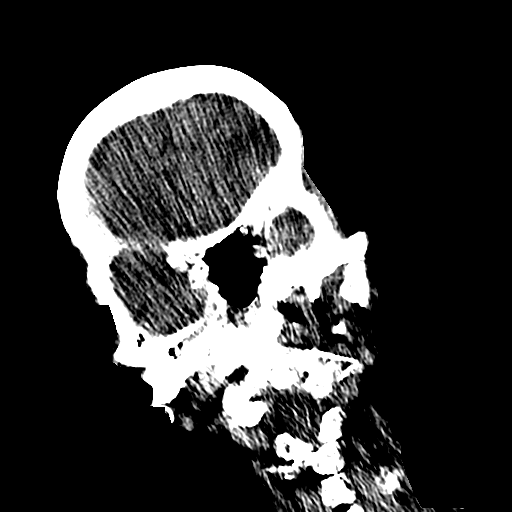
[im 108/341  brain]
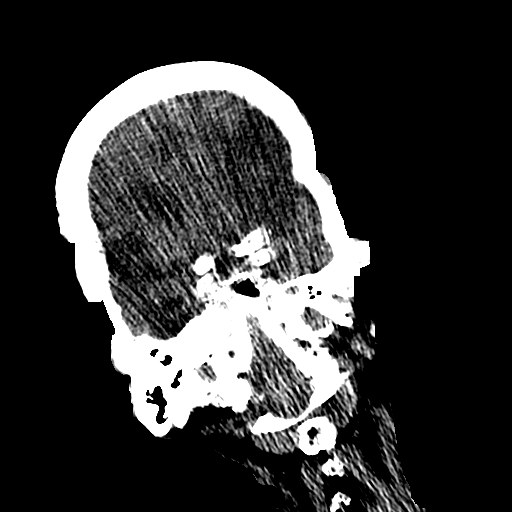
[im 108/341  bone]
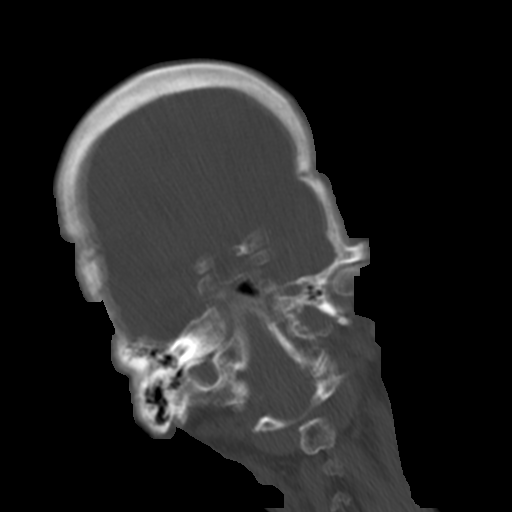
[im 126/341  brain]
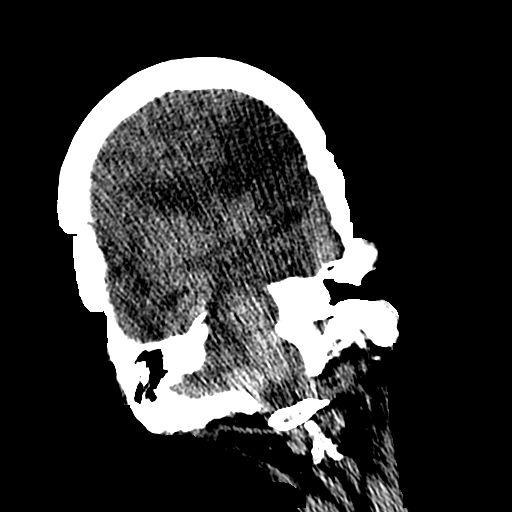
[im 144/341  brain]
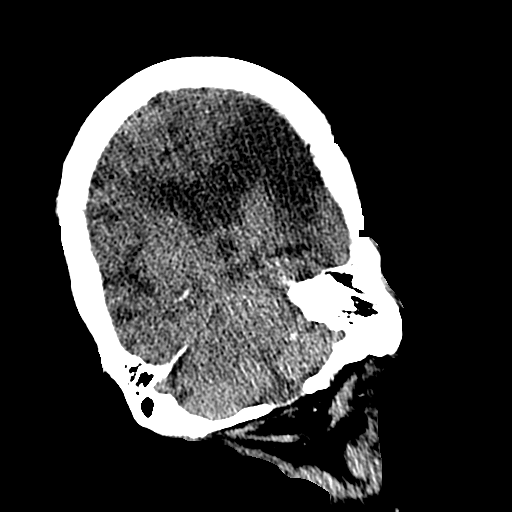
[im 179/341  brain]
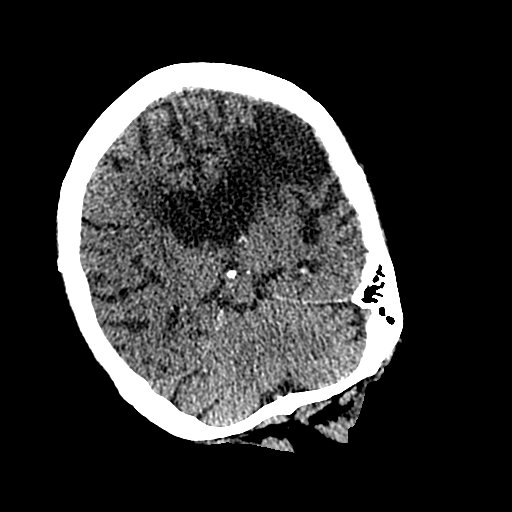
[im 197/341  brain]
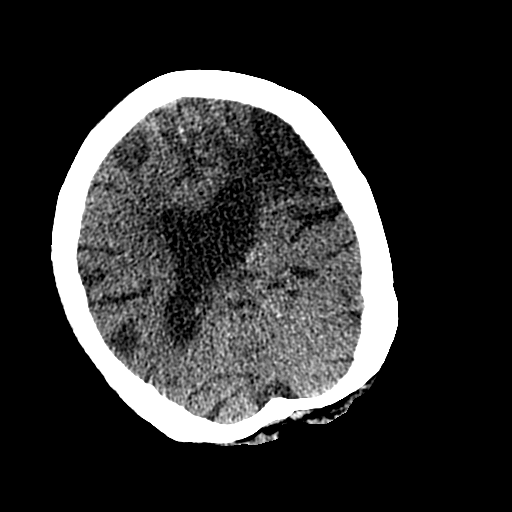
[im 197/341  bone]
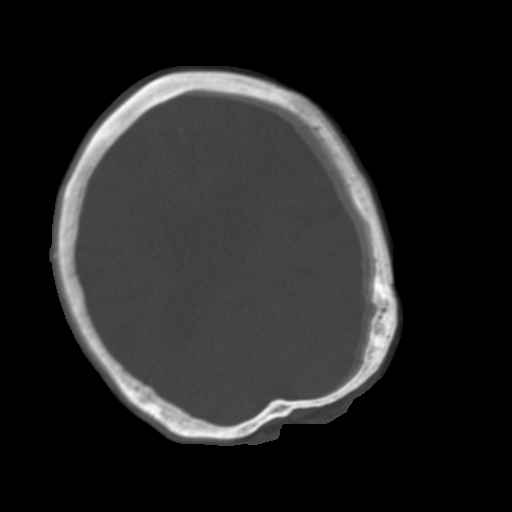
[im 215/341  brain]
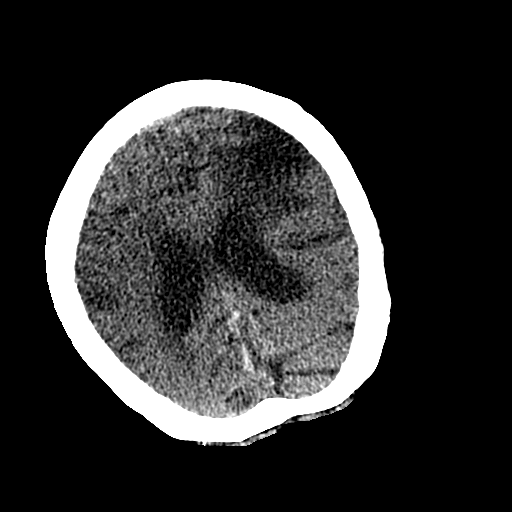
[im 233/341  brain]
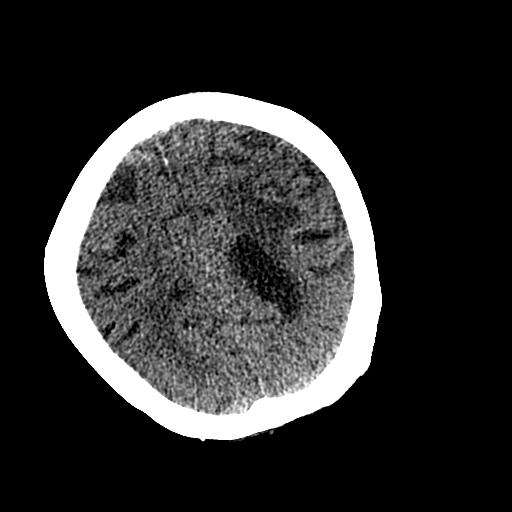
[im 251/341  brain]
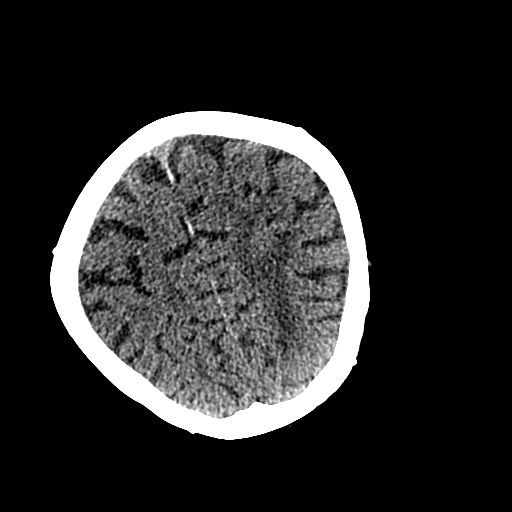
[im 287/341  brain]
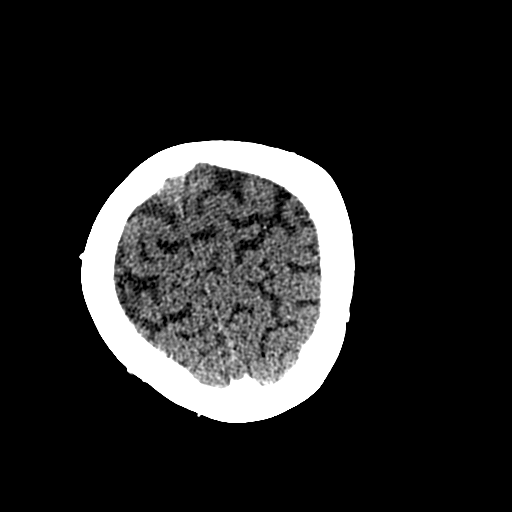
[im 287/341  bone]
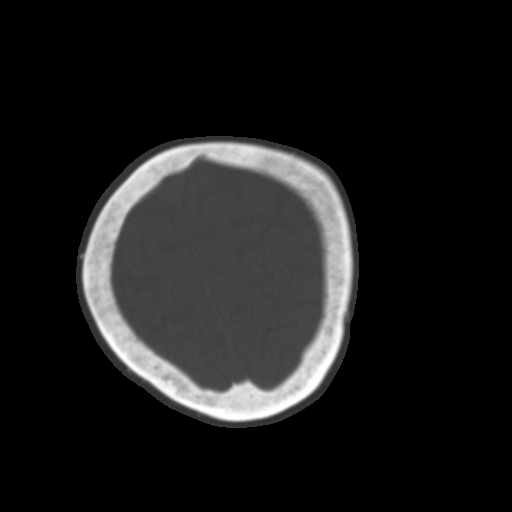
[im 305/341  brain]
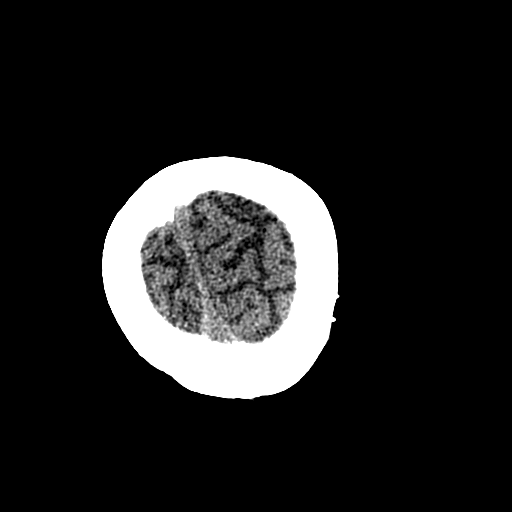
[im 323/341  brain]
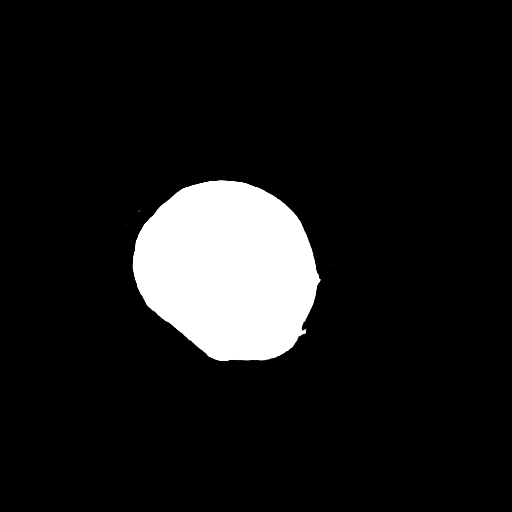

[15 of 30 positions shown; findings below may reference images not displayed]

FINDINGS: Please note, as the patient is markedly kyphotic, initial
scanning resulting in near coronal images, these were reformatted
to the axial images. Moderately motion degraded examination.

No intraparenchymal hemorrhage, mass effect or midline shift.  Left
frontal encephalomalacia with mineralization, corresponding to
patient's known remote left middle cerebral artery territory
infarct.  No hydrocephalous.  No midline shift or mass effect.
Patchy to confluent supratentorial white matter hypodensities
suggest sequelae of chronic small vessel ischemic disease.

No definite abnormal extra-axial fluid collections.  Mild right
maxillary mucosal thickening mild paranasal sinus air-fluid levels.
No depressed skull fracture.
IMPRESSION: Motion and habitus limited examination.

No convincing evidence of acute intracranial process.

Remote left middle cerebral artery territory infarct.  Moderate to
severe white matter changes suggest sequelae of chronic small
vessel ischemic disease.

## 2013-11-04 IMAGING — US IR US GUIDE VASC ACCESS LEFT
1 series · 4 of 4 positions shown · non-contrast
Comparison: none

CLINICAL DATA: [AGE] female with malnutrition and failure to
thrive. She requires terrible central venous access for hydration
and nutrition.

EXAM:
IR LEFT FLUORO GUIDE CV LINE; IR ULTRASOUND GUIDANCE VASC ACCESS
LEFT
TECHNIQUE: The left arm was prepped with chlorhexidine, draped in the usual
sterile fashion using maximum barrier technique (cap and mask,
sterile gown, sterile gloves, large sterile sheet, hand hygiene and
cutaneous antiseptic). Local anesthesia was attained by infiltration
with 1% lidocaine.

[Series 1: ir us guide vasc access left · 4 of 4 slices shown]
[im 1/4]
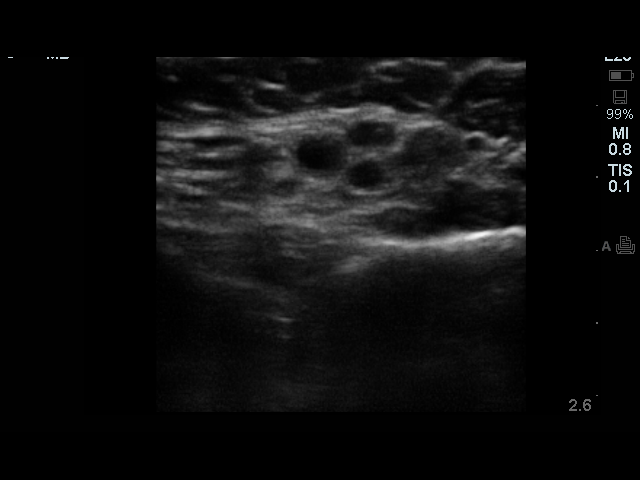
[im 2/4]
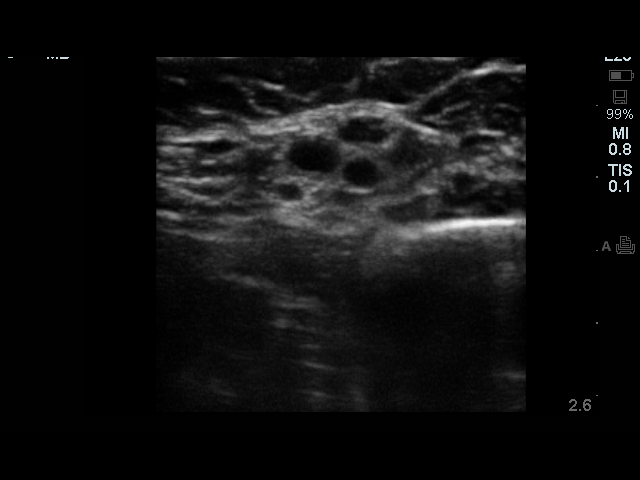
[im 3/4]
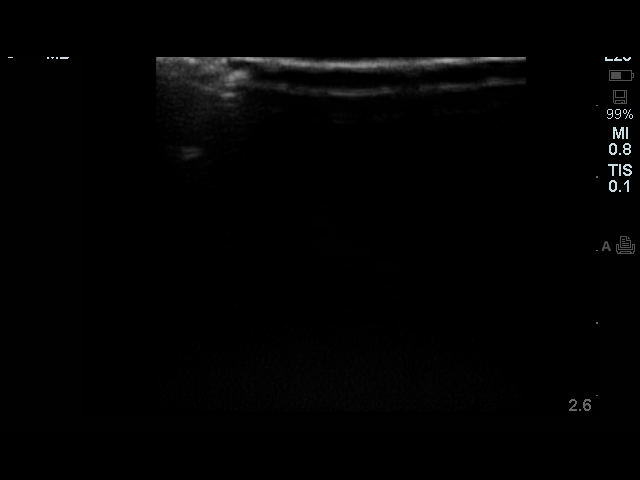
[im 4/4]
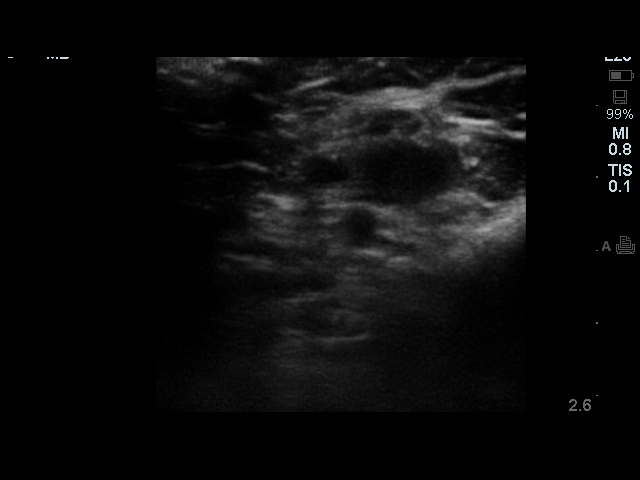

[4 of 4 positions shown; findings below may reference images not displayed]

Ultrasound demonstrated patency of the left brachial vein, and this
was documented with an image. Under real-time ultrasound guidance,
this vein was accessed with a 21 gauge micropuncture needle and
image documentation was performed. The needle was exchanged over a
guidewire for a peel-away sheath through which a 42 cm 5 French
single lumen power injectable PICC was advanced, and positioned with
its tip at the lower SVC/right atrial junction. Fluoroscopy during
the procedure and fluoro spot radiograph confirms appropriate
catheter position. The catheter was flushed, secured to the skin
with Prolene sutures, and covered with a sterile dressing.

FLUOROSCOPY TIME:  3 min 56 seconds minutes.

COMPLICATIONS:
None.  The patient tolerated the procedure well.
IMPRESSION: Successful placement of a left arm PICC with sonographic and
fluoroscopic guidance. The catheter is ready for use.

Of note, the patient has a tight stenosis in the left subclavian
vein around her pacemaker wires. Additionally, the right upper
extremity is extremely edematous and bruised and was not suitable
for PICC placement.

## 2013-11-17 IMAGING — CR DG CHEST 1V PORT
1 series · 1 of 1 positions shown · non-contrast
Comparison: Portable exam 6566 hr compared to 12/03/2012

CLINICAL DATA: Recent pneumonia, abnormal labs, history stroke,
COPD, hypertension, MI

EXAM:
PORTABLE CHEST - 1 VIEW

[AP]
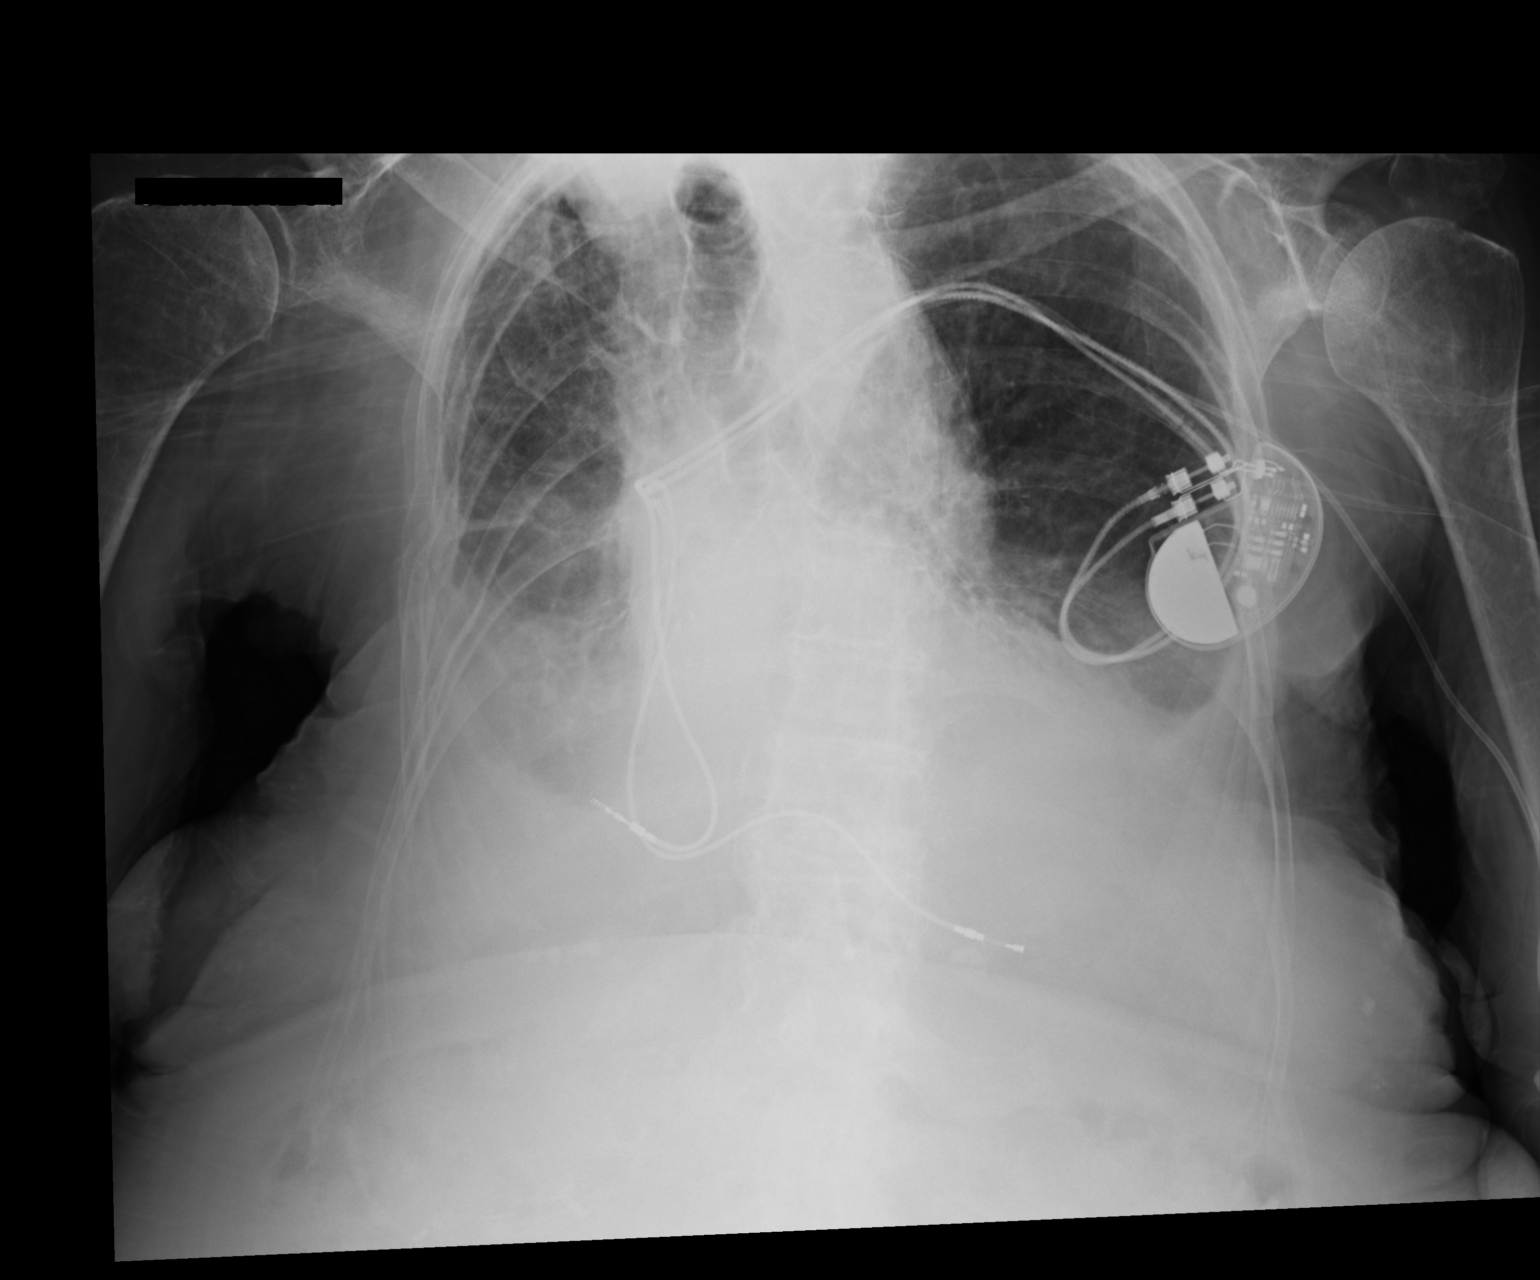

[1 of 1 positions shown; findings below may reference images not displayed]

FINDINGS: Left subclavian transvenous pacemaker leads project over right
atrium and right ventricle.

Enlargement of cardiac silhouette.

Atherosclerotic calcification aorta.

Bibasilar effusions and atelectasis, cannot exclude consolidation at
lung bases.

Underlying COPD.

No pneumothorax.

Bones demineralized with multiple old right rib fractures.
IMPRESSION: Enlargement of cardiac silhouette post pacemaker.

Changes of COPD with bibasilar pleural effusions and atelectasis,
unable to exclude underlying consolidation at lung bases.

## 2013-12-07 IMAGING — CR DG CHEST 2V
2 series · 2 of 2 positions shown · non-contrast
Comparison: 12/22/2012

CLINICAL DATA: Dysphagia

EXAM:
CHEST  2 VIEW

[x chest ap]
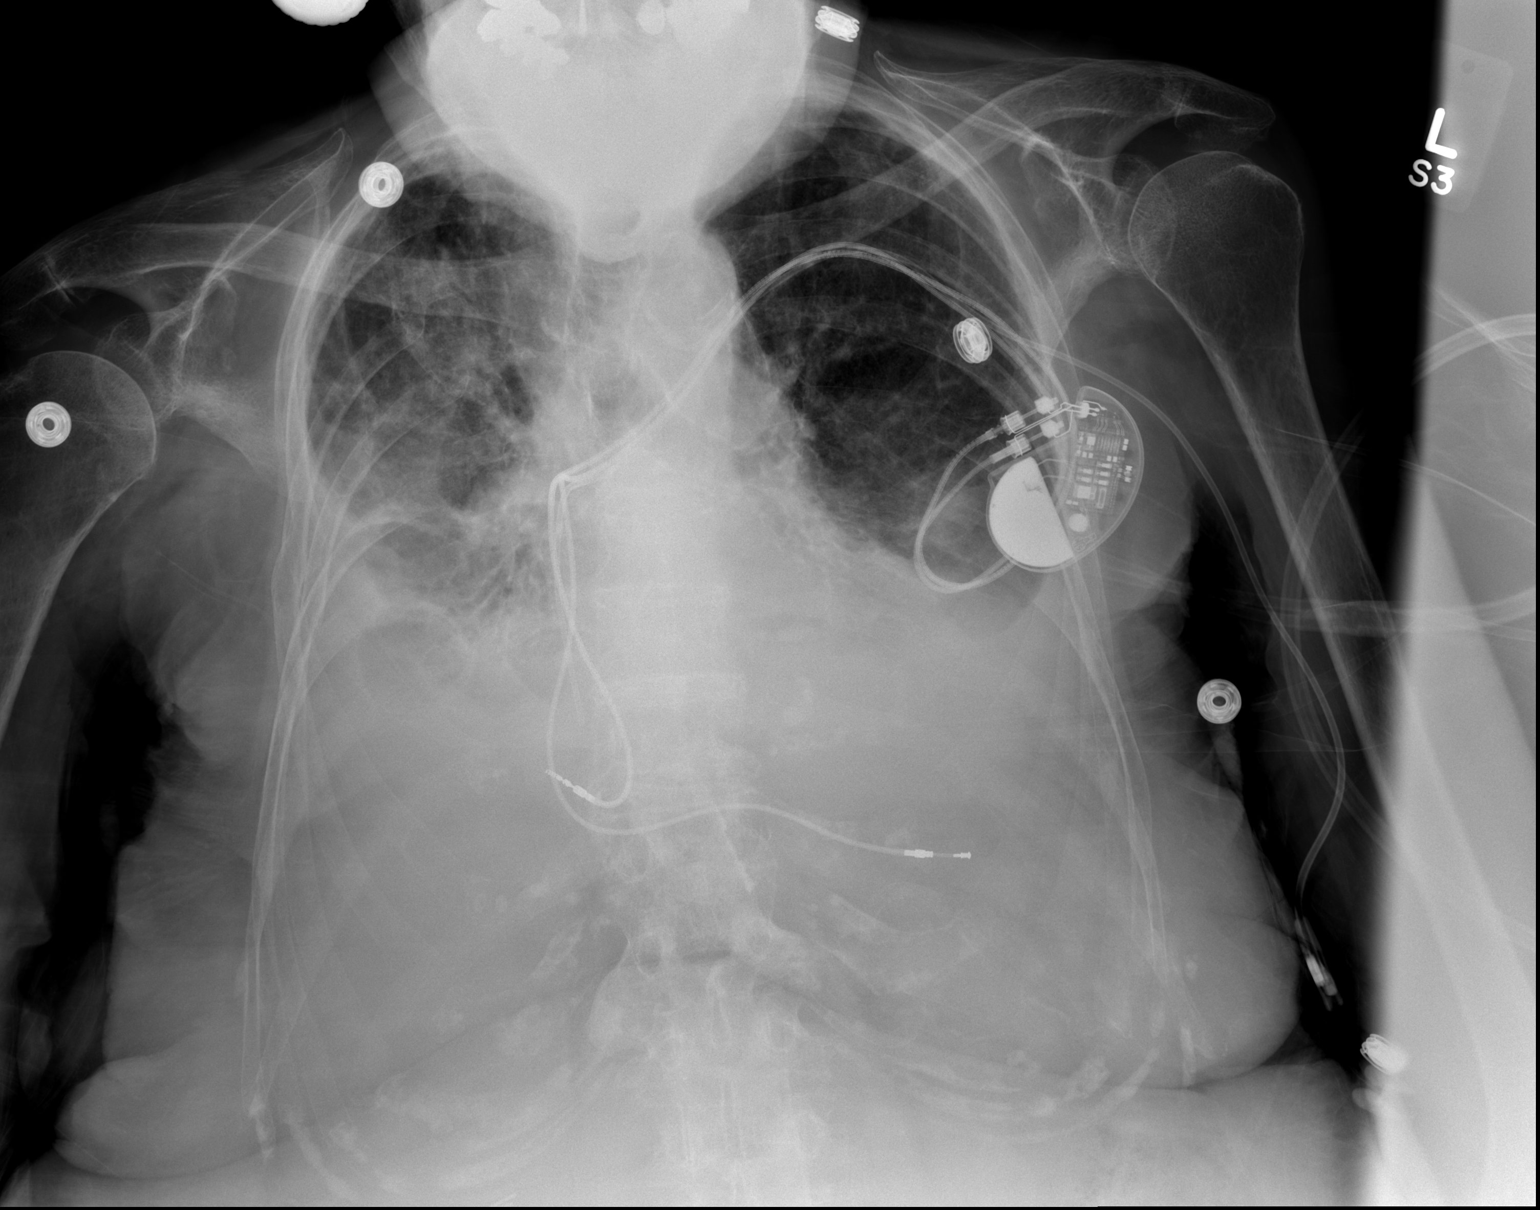

[w chest lat]
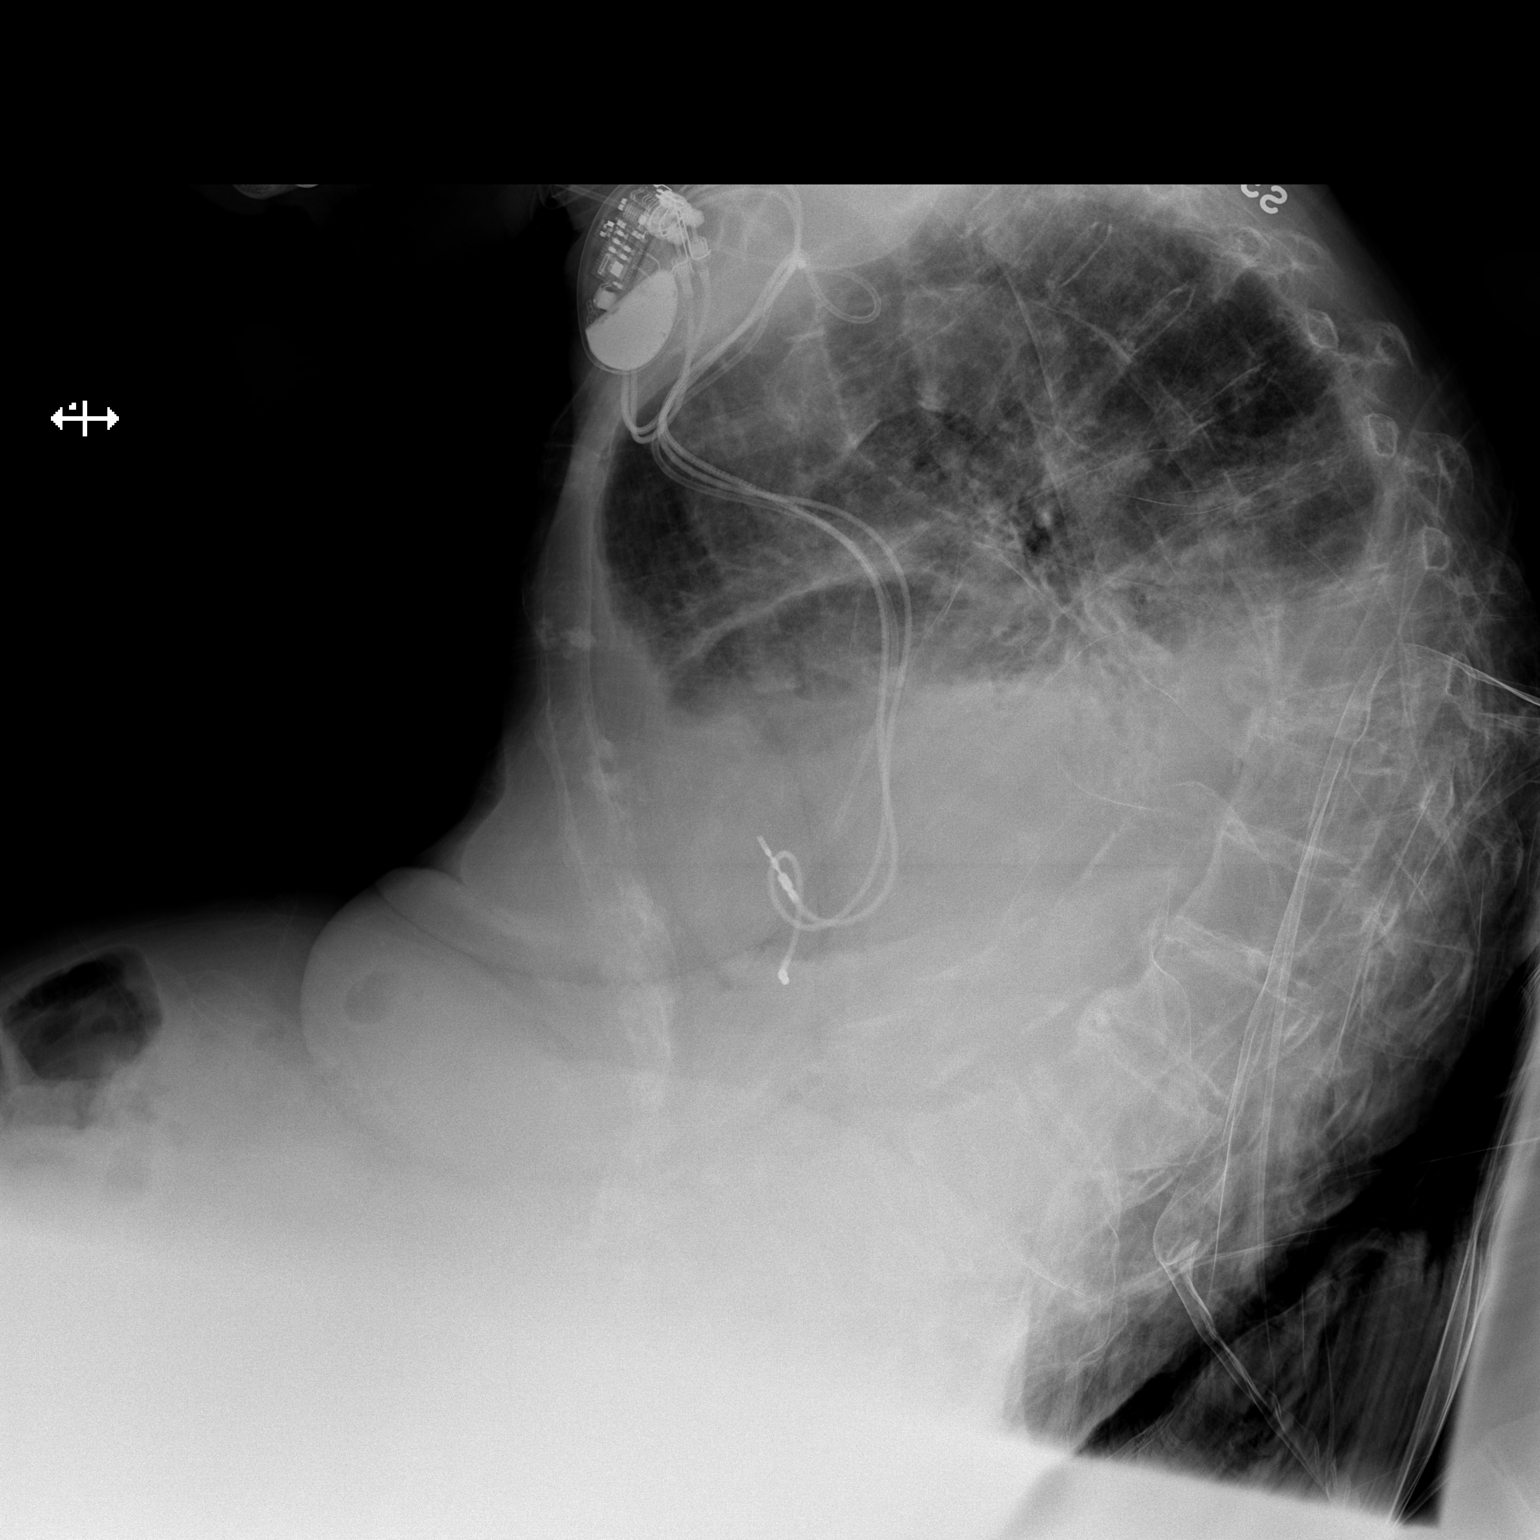

[2 of 2 positions shown; findings below may reference images not displayed]

FINDINGS: Persistent moderate pleural effusions with compressive basilar
atelectasis/ consolidation. Difficult to exclude pneumonia. Heart is
enlarged. Left subclavian pacer evident. Atherosclerosis of the
aorta. Degenerative changes of the spine. Chronic mid thoracic
compression fracture. Increased thoracic kyphosis noted.
IMPRESSION: Persistent cardiomegaly with bilateral pleural effusions and basilar
atelectasis/consolidation. No significant interval change.
